# Patient Record
Sex: Female | Born: 1950 | Race: White | Hispanic: No | Marital: Married | Smoking: Never smoker
Health system: Southern US, Community
[De-identification: ages and names within clinical notes are randomized; demographics above are authoritative.]

## PROBLEM LIST (undated history)

## (undated) DIAGNOSIS — I10 Essential (primary) hypertension: Secondary | ICD-10-CM

## (undated) DIAGNOSIS — M1990 Unspecified osteoarthritis, unspecified site: Secondary | ICD-10-CM

## (undated) DIAGNOSIS — D649 Anemia, unspecified: Secondary | ICD-10-CM

## (undated) DIAGNOSIS — Z8719 Personal history of other diseases of the digestive system: Secondary | ICD-10-CM

## (undated) DIAGNOSIS — M4800 Spinal stenosis, site unspecified: Secondary | ICD-10-CM

## (undated) DIAGNOSIS — F419 Anxiety disorder, unspecified: Secondary | ICD-10-CM

## (undated) DIAGNOSIS — D72819 Decreased white blood cell count, unspecified: Secondary | ICD-10-CM

## (undated) DIAGNOSIS — K5790 Diverticulosis of intestine, part unspecified, without perforation or abscess without bleeding: Secondary | ICD-10-CM

## (undated) DIAGNOSIS — K449 Diaphragmatic hernia without obstruction or gangrene: Secondary | ICD-10-CM

## (undated) DIAGNOSIS — D509 Iron deficiency anemia, unspecified: Secondary | ICD-10-CM

## (undated) DIAGNOSIS — K219 Gastro-esophageal reflux disease without esophagitis: Secondary | ICD-10-CM

## (undated) DIAGNOSIS — C439 Malignant melanoma of skin, unspecified: Secondary | ICD-10-CM

## (undated) DIAGNOSIS — R413 Other amnesia: Secondary | ICD-10-CM

## (undated) HISTORY — DX: Diverticulosis of intestine, part unspecified, without perforation or abscess without bleeding: K57.90

## (undated) HISTORY — DX: Essential (primary) hypertension: I10

## (undated) HISTORY — DX: Diaphragmatic hernia without obstruction or gangrene: K44.9

## (undated) HISTORY — DX: Gastro-esophageal reflux disease without esophagitis: K21.9

## (undated) HISTORY — DX: Other amnesia: R41.3

## (undated) HISTORY — PX: COLONOSCOPY WITH ESOPHAGOGASTRODUODENOSCOPY (EGD): SHX5779

## (undated) HISTORY — PX: APPENDECTOMY: SHX54

## (undated) HISTORY — PX: COLOSTOMY: SHX63

## (undated) HISTORY — DX: Anemia, unspecified: D64.9

## (undated) HISTORY — DX: Spinal stenosis, site unspecified: M48.00

## (undated) HISTORY — DX: Anxiety disorder, unspecified: F41.9

## (undated) HISTORY — DX: Decreased white blood cell count, unspecified: D72.819

## (undated) HISTORY — DX: Iron deficiency anemia, unspecified: D50.9

## (undated) HISTORY — DX: Malignant melanoma of skin, unspecified: C43.9

---

## 1999-04-18 ENCOUNTER — Other Ambulatory Visit: Admission: RE | Admit: 1999-04-18 | Discharge: 1999-04-18 | Payer: Self-pay | Admitting: Obstetrics and Gynecology

## 2000-06-22 ENCOUNTER — Other Ambulatory Visit: Admission: RE | Admit: 2000-06-22 | Discharge: 2000-06-22 | Payer: Self-pay | Admitting: Obstetrics and Gynecology

## 2001-06-27 ENCOUNTER — Other Ambulatory Visit: Admission: RE | Admit: 2001-06-27 | Discharge: 2001-06-27 | Payer: Self-pay | Admitting: Obstetrics and Gynecology

## 2002-07-04 ENCOUNTER — Other Ambulatory Visit: Admission: RE | Admit: 2002-07-04 | Discharge: 2002-07-04 | Payer: Self-pay | Admitting: Obstetrics and Gynecology

## 2003-07-16 ENCOUNTER — Other Ambulatory Visit: Admission: RE | Admit: 2003-07-16 | Discharge: 2003-07-16 | Payer: Self-pay | Admitting: Obstetrics and Gynecology

## 2003-07-28 LAB — HM COLONOSCOPY: HM Colonoscopy: NORMAL

## 2004-03-21 ENCOUNTER — Encounter: Admission: RE | Admit: 2004-03-21 | Discharge: 2004-03-21 | Payer: Self-pay | Admitting: Otolaryngology

## 2004-07-19 ENCOUNTER — Other Ambulatory Visit: Admission: RE | Admit: 2004-07-19 | Discharge: 2004-07-19 | Payer: Self-pay | Admitting: *Deleted

## 2005-07-21 ENCOUNTER — Other Ambulatory Visit: Admission: RE | Admit: 2005-07-21 | Discharge: 2005-07-21 | Payer: Self-pay | Admitting: Obstetrics & Gynecology

## 2006-08-02 ENCOUNTER — Other Ambulatory Visit: Admission: RE | Admit: 2006-08-02 | Discharge: 2006-08-02 | Payer: Self-pay | Admitting: Obstetrics & Gynecology

## 2007-08-12 ENCOUNTER — Other Ambulatory Visit: Admission: RE | Admit: 2007-08-12 | Discharge: 2007-08-12 | Payer: Self-pay | Admitting: Obstetrics & Gynecology

## 2008-08-20 ENCOUNTER — Other Ambulatory Visit: Admission: RE | Admit: 2008-08-20 | Discharge: 2008-08-20 | Payer: Self-pay | Admitting: Obstetrics & Gynecology

## 2010-05-05 LAB — HM DEXA SCAN: HM Dexa Scan: NORMAL

## 2011-10-25 LAB — HM PAP SMEAR: HM Pap smear: NEGATIVE

## 2012-05-10 LAB — HM MAMMOGRAPHY: HM Mammogram: NEGATIVE

## 2012-11-20 ENCOUNTER — Encounter: Payer: Self-pay | Admitting: Obstetrics & Gynecology

## 2012-11-21 ENCOUNTER — Ambulatory Visit: Payer: Self-pay | Admitting: Obstetrics & Gynecology

## 2012-11-26 ENCOUNTER — Ambulatory Visit: Payer: Self-pay | Admitting: Obstetrics & Gynecology

## 2012-11-28 ENCOUNTER — Ambulatory Visit (INDEPENDENT_AMBULATORY_CARE_PROVIDER_SITE_OTHER): Payer: BC Managed Care – PPO | Admitting: Obstetrics & Gynecology

## 2012-11-28 ENCOUNTER — Encounter: Payer: Self-pay | Admitting: Obstetrics & Gynecology

## 2012-11-28 VITALS — BP 170/80 | HR 64 | Resp 16 | Ht 62.75 in | Wt 126.8 lb

## 2012-11-28 DIAGNOSIS — Z01419 Encounter for gynecological examination (general) (routine) without abnormal findings: Secondary | ICD-10-CM

## 2012-11-28 MED ORDER — ATENOLOL 25 MG PO TABS: 25.0000 mg | ORAL_TABLET | Freq: Every day | ORAL | Status: DC

## 2012-11-28 NOTE — Progress Notes (Signed)
62 y.o. [id number] MarriedCaucasianF here for annual exam.   Off her BP medication.  She has lost weight and is exercising regularly.  She is having some stress--66 year old son is living at home.  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  Worried about her memory.  She feels like she is being more forgetful about names or events in the past.  She is worried this could be something serious.  She brought blood pressure readings with her:  132/78, 152/82, 117/75, 140/85, 128/80, 132/75.  [facility] Lower readings are all after exercise.    Going to [place] in two weeks.  So excited.  Patient's last menstrual period was 05/30/2003.          Sexually active: yes  The current method of family planning is post menopausal status.    Exercising: yes  walking and cardio Smoker:  no  Health Maintenance:  Pap:  10/25/11 WNL/negative HR HPV History of abnormal Pap:  yes MMG:  05/10/12 3D normal Colonoscopy:  3/05 repeat in 10 years BMD:   12/11 normal TDaP:  2/11 Screening Labs: PCP, Hb today: PCP, Urine today: PCP   reports that she has never smoked. She has never used smokeless tobacco. She reports that she drinks about 0.5 ounces of alcohol per week. She reports that she does not use illicit drugs.  Past Medical History  Diagnosis Date  . Hypertension     off medication since 9/13    History reviewed. No pertinent past surgical history.  Current Outpatient Prescriptions  Medication Sig Dispense Refill  . ALPRAZolam (XANAX) 0.25 MG tablet 0.25 mg as needed.      Marland Kitchen aspirin EC 81 MG tablet Take 81 mg by mouth daily.      . Multiple Vitamin (MULTIVITAMIN) tablet Take 1 tablet by mouth daily.       No current facility-administered medications for this visit.    No family history on file.  ROS:  Pertinent items are noted in HPI.  Otherwise, a comprehensive ROS was negative.  Exam:   BP 170/80  Pulse 64  Resp 16  Ht 5' 2.75" (1.594 m)  Wt  126 lb 12.8 oz (57.516 kg)  BMI 22.64 kg/m2  LMP 05/30/2003  Weight change: 135  Height: 5' 2.75" (159.4 cm)  Ht Readings from Last 3 Encounters:  11/28/12 5' 2.75" (1.594 m)    General appearance: alert, cooperative and appears stated age Head: Normocephalic, without obvious abnormality, atraumatic Neck: no adenopathy, supple, symmetrical, trachea midline and thyroid normal to inspection and palpation Lungs: clear to auscultation bilaterally Breasts: normal appearance, no masses or tenderness Heart: regular rate and rhythm Abdomen: soft, non-tender; bowel sounds normal; no masses,  no organomegaly Extremities: extremities normal, atraumatic, no cyanosis or edema Skin: Skin color, texture, turgor normal. No rashes or lesions Lymph nodes: Cervical, supraclavicular, and axillary nodes normal. No abnormal inguinal nodes palpated Neurologic: Grossly normal   Pelvic: External genitalia:  no lesions              Urethra:  normal appearing urethra with no masses, tenderness or lesions              Bartholins and Skenes: normal                 Vagina: normal appearing vagina with normal color and discharge, no lesions  Cervix: no lesions              Pap taken: no Bimanual Exam:  Uterus:  normal size, contour, position, consistency, mobility, non-tender              Adnexa: normal adnexa and no mass, fullness, tenderness               Rectovaginal: Confirms               Anus:  normal sphincter tone, no lesions  A:  Well Woman with normal exam Hypertension PMP, no HRT Memory concerns  P:   Mammogram yearly pap smear with neg HR HPV last year.  NO pap today. Restarted Atenolol 25mg  daily.  Rx to pharmacy.  Pt will follow-up with Dr. Paulino Rily Patient will call back if she decides she would like referral for neuro evaluation return annually or prn  An After Visit Summary was printed and given to the patient.

## 2012-11-28 NOTE — Patient Instructions (Signed)

## 2013-02-21 DIAGNOSIS — K219 Gastro-esophageal reflux disease without esophagitis: Secondary | ICD-10-CM | POA: Insufficient documentation

## 2013-04-03 ENCOUNTER — Other Ambulatory Visit: Payer: Self-pay

## 2013-05-12 ENCOUNTER — Encounter: Payer: Self-pay | Admitting: Obstetrics & Gynecology

## 2013-05-12 ENCOUNTER — Ambulatory Visit (INDEPENDENT_AMBULATORY_CARE_PROVIDER_SITE_OTHER): Payer: BC Managed Care – PPO | Admitting: Obstetrics & Gynecology

## 2013-05-12 VITALS — BP 158/80 | HR 72 | Resp 16 | Wt 128.0 lb

## 2013-05-12 DIAGNOSIS — R1310 Dysphagia, unspecified: Secondary | ICD-10-CM

## 2013-05-12 DIAGNOSIS — D649 Anemia, unspecified: Secondary | ICD-10-CM

## 2013-05-12 LAB — CBC
HCT: 40 % (ref 36.0–46.0)
Hemoglobin: 13.7 g/dL (ref 12.0–15.0)
MCH: 30 pg (ref 26.0–34.0)
MCHC: 34 g/dL (ref 30.0–36.0)
MCV: 87.5 fL (ref 78.0–100.0)
Platelets: 245 10*3/uL (ref 150–400)
RBC: 4.57 MIL/uL (ref 3.87–5.11)
RDW: 15.9 % — ABNORMAL HIGH (ref 11.5–15.5)
WBC: 5.6 10*3/uL (ref 4.0–10.5)

## 2013-05-12 NOTE — Patient Instructions (Signed)
We will call with test results.

## 2013-05-12 NOTE — Progress Notes (Signed)
Subjective:     Patient ID: Carla Ramirez, female   DOB: 1950-12-25, 62 y.o.   MRN: [id number]  HPI 62 yo G2P3 MWF here for discussion of "breast finding".  Pt went to [facility] today for her screening MMG and reported a breast "lump" that she has noticed over last several months.  She reported that to [facility], who called to see if I wanted her to come here first or just proceed with imaging.  Pt reports that an ultrasound in addition to MMG was done and she was told that the finding is a rib.  She has lost about 40 pounds over last 2 years and she was told this could contribute.  While she is here, she wants to discuss a couple of other issues.  Reports in the late summer, ate some peanuts.  Had RLQ pain.  Also had some rectal bleeding.  Was seen by Carla Ramirez records in [facility].  Reports was anemic and he thought maybe she had a bleeding ulcer.  Underwent a colonoscopy and upper endoscopy in September.  Also reports having a feeling of something getting caught in her throat.  Was told the endoscopy was normal.  Started on prilosec which she thinks has done nothing.  Also reports that she has been on a baby ASA for family hx of stroke.  She wondered if this was part of the problem so stopped taking it.  Again, has not noticed any changes.    Wants to know what to do next.  Never had follow up with Carla Ramirez.  Was told everything was ok except two polyps removed.   Review of Systems  All other systems reviewed and are negative.       Objective:   Physical Exam  Constitutional: She appears well-developed and well-nourished.  Neck: Normal range of motion. Neck supple. No tracheal deviation present. No thyromegaly present.  Pulmonary/Chest:    Lymphadenopathy:    She has no cervical adenopathy.       Assessment:     "Palpable breast finding"--rib, after weight loss Sensation of trouble swallowing or something getting caught in throat H/O anemia before colonoscopy in Sept with Carla Ramirez       Plan:     Release of records from Carla Ramirez CBC Thyroid u/s.

## 2013-05-14 ENCOUNTER — Telehealth: Payer: Self-pay

## 2013-05-14 NOTE — Telephone Encounter (Signed)
Spoke with patient-will mail release form for her to sign and mail back, so we can get information from Dr [employer] office. She states she has a bad cold and did reschedule her test at [facility] until 06/02/13.

## 2013-05-14 NOTE — Telephone Encounter (Signed)
Returning a call to Kelly °

## 2013-05-14 NOTE — Telephone Encounter (Signed)
Message copied by Elisha Headland on Wed May 14, 2013  9:18 AM ------      Message from: Jerene Bears      Created: Tue May 13, 2013  5:43 AM       Informed via MyChart ------

## 2013-05-14 NOTE — Telephone Encounter (Signed)
lmtcb

## 2013-05-15 ENCOUNTER — Other Ambulatory Visit: Payer: Self-pay

## 2013-06-02 ENCOUNTER — Other Ambulatory Visit: Payer: Self-pay

## 2013-06-23 ENCOUNTER — Telehealth: Payer: Self-pay | Admitting: Emergency Medicine

## 2013-06-23 NOTE — Telephone Encounter (Signed)
Message copied by Michele Mcalpine on Mon Jun 23, 2013  9:37 AM ------      Message from: Megan Salon      Created: Fri Jun 20, 2013 11:59 AM      Regarding: RE: Thyroid U/S       Please call her and see what is going on?            MSM      ----- Message -----         From: Karen Chafe, RN         Sent: 06/20/2013  10:22 AM           To: Lyman Speller, MD      Subject: Thyroid U/S                                              Dr. Sabra Heck, this patient was on the list of the u/s that you ordered. It appears she did not schedule u/s. Do you want me to remind patient and schedule?       ------

## 2013-06-23 NOTE — Telephone Encounter (Signed)
Spoke with patient. She states she has decided to hold off on getting Thyroid u/s for now as she states "I really don't feel like this is my thyroid". She got some stronger medications from her primary doctor for acid reflux and will try those for while and then call back if she changes her mind about u/s.   Routing to provider for final review. Patient agreeable to disposition. Will close encounter

## 2013-06-23 NOTE — Telephone Encounter (Signed)
Message left to return call to Coleen Cardiff at [phone].    

## 2014-01-30 ENCOUNTER — Telehealth: Payer: Self-pay | Admitting: Obstetrics & Gynecology

## 2014-01-30 ENCOUNTER — Ambulatory Visit (INDEPENDENT_AMBULATORY_CARE_PROVIDER_SITE_OTHER): Payer: BC Managed Care – PPO | Admitting: Certified Nurse Midwife

## 2014-01-30 ENCOUNTER — Encounter: Payer: Self-pay | Admitting: Certified Nurse Midwife

## 2014-01-30 VITALS — BP 110/70 | HR 68 | Temp 98.6°F | Resp 16 | Ht 62.75 in | Wt 133.0 lb

## 2014-01-30 DIAGNOSIS — B3731 Acute candidiasis of vulva and vagina: Secondary | ICD-10-CM

## 2014-01-30 DIAGNOSIS — N390 Urinary tract infection, site not specified: Secondary | ICD-10-CM

## 2014-01-30 DIAGNOSIS — B373 Candidiasis of vulva and vagina: Secondary | ICD-10-CM

## 2014-01-30 LAB — POCT URINALYSIS DIPSTICK
Bilirubin, UA: NEGATIVE
Glucose, UA: NEGATIVE
Ketones, UA: NEGATIVE
Nitrite, UA: NEGATIVE
Protein, UA: NEGATIVE
Urobilinogen, UA: NEGATIVE
pH, UA: 5

## 2014-01-30 MED ORDER — CIPROFLOXACIN HCL 500 MG PO TABS: 500.0000 mg | ORAL_TABLET | Freq: Two times a day (BID) | ORAL | Status: DC

## 2014-01-30 MED ORDER — NYSTATIN 100000 UNIT/GM EX CREA: 1.0000 "application " | TOPICAL_CREAM | Freq: Two times a day (BID) | CUTANEOUS | Status: DC

## 2014-01-30 NOTE — Telephone Encounter (Signed)
Pt thinks she may have a uti and would like an appointment today if possible.

## 2014-01-30 NOTE — Patient Instructions (Addendum)
Urinary Tract Infection Urinary tract infections (UTIs) can develop anywhere along your urinary tract. Your urinary tract is your body's drainage system for removing wastes and extra water. Your urinary tract includes two kidneys, two ureters, a bladder, and a urethra. Your kidneys are a pair of bean-shaped organs. Each kidney is about the size of your fist. They are located below your ribs, one on each side of your spine. CAUSES Infections are caused by microbes, which are microscopic organisms, including fungi, viruses, and bacteria. These organisms are so small that they can only be seen through a microscope. Bacteria are the microbes that most commonly cause UTIs. SYMPTOMS  Symptoms of UTIs may vary by age and gender of the patient and by the location of the infection. Symptoms in young women typically include a frequent and intense urge to urinate and a painful, burning feeling in the bladder or urethra during urination. Older women and men are more likely to be tired, shaky, and weak and have muscle aches and abdominal pain. A fever may mean the infection is in your kidneys. Other symptoms of a kidney infection include pain in your back or sides below the ribs, nausea, and vomiting. DIAGNOSIS To diagnose a UTI, your caregiver will ask you about your symptoms. Your caregiver also will ask to provide a urine sample. The urine sample will be tested for bacteria and white blood cells. White blood cells are made by your body to help fight infection. TREATMENT  Typically, UTIs can be treated with medication. Because most UTIs are caused by a bacterial infection, they usually can be treated with the use of antibiotics. The choice of antibiotic and length of treatment depend on your symptoms and the type of bacteria causing your infection. HOME CARE INSTRUCTIONS  If you were prescribed antibiotics, take them exactly as your caregiver instructs you. Finish the medication even if you feel better after you  have only taken some of the medication.  Drink enough water and fluids to keep your urine clear or pale yellow.  Avoid caffeine, tea, and carbonated beverages. They tend to irritate your bladder.  Empty your bladder often. Avoid holding urine for long periods of time.  Empty your bladder before and after sexual intercourse.  After a bowel movement, women should cleanse from front to back. Use each tissue only once. SEEK MEDICAL CARE IF:   You have back pain.  You develop a fever.  Your symptoms do not begin to resolve within 3 days. SEEK IMMEDIATE MEDICAL CARE IF:   You have severe back pain or lower abdominal pain.  You develop chills.  You have nausea or vomiting.  You have continued burning or discomfort with urination. MAKE SURE YOU:   Understand these instructions.  Will watch your condition.  Will get help right away if you are not doing well or get worse. Document Released: 02/22/2005 Document Revised: 11/14/2011 Document Reviewed: 06/23/2011 [facility] Patient Information 2015 [place], [place]. This information is not intended to replace advice given to you by your health care provider. Make sure you discuss any questions you have with your health care provider. Candida Infection A Candida infection (also called yeast, fungus, and Monilia infection) is an overgrowth of yeast that can occur anywhere on the body. A yeast infection commonly occurs in warm, moist body areas. Usually, the infection remains localized but can spread to become a systemic infection. A yeast infection may be a sign of a more severe disease such as diabetes, leukemia, or AIDS. A yeast  infection can occur in both men and women. In women, Candida vaginitis is a vaginal infection. It is one of the most common causes of vaginitis. Men usually do not have symptoms or know they have an infection until other problems develop. Men may find out they have a yeast infection because their sex partner has a yeast  infection. Uncircumcised men are more likely to get a yeast infection than circumcised men. This is because the uncircumcised glans is not exposed to air and does not remain as dry as that of a circumcised glans. Older adults may develop yeast infections around dentures. CAUSES  Women  Antibiotics.  Steroid medication taken for a long time.  Being overweight (obese).  Diabetes.  Poor immune condition.  Certain serious medical conditions.  Immune suppressive medications for organ transplant patients.  Chemotherapy.  Pregnancy.  Menstruation.  Stress and fatigue.  Intravenous drug use.  Oral contraceptives.  Wearing tight-fitting clothes in the crotch area.  Catching it from a sex partner who has a yeast infection.  Spermicide.  Intravenous, urinary, or other catheters. Men  Catching it from a sex partner who has a yeast infection.  Having oral or anal sex with a person who has the infection.  Spermicide.  Diabetes.  Antibiotics.  Poor immune system.  Medications that suppress the immune system.  Intravenous drug use.  Intravenous, urinary, or other catheters. SYMPTOMS  Women  Thick, white vaginal discharge.  Vaginal itching.  Redness and swelling in and around the vagina.  Irritation of the lips of the vagina and perineum.  Blisters on the vaginal lips and perineum.  Painful sexual intercourse.  Low blood sugar (hypoglycemia).  Painful urination.  Bladder infections.  Intestinal problems such as constipation, indigestion, bad breath, bloating, increase in gas, diarrhea, or loose stools. Men  Men may develop intestinal problems such as constipation, indigestion, bad breath, bloating, increase in gas, diarrhea, or loose stools.  Dry, cracked skin on the penis with itching or discomfort.  Jock itch.  Dry, flaky skin.  Athlete's foot.  Hypoglycemia. DIAGNOSIS  Women  A history and an exam are performed.  The discharge may be  examined under a microscope.  A culture may be taken of the discharge. Men  A history and an exam are performed.  Any discharge from the penis or areas of cracked skin will be looked at under the microscope and cultured.  Stool samples may be cultured. TREATMENT  Women  Vaginal antifungal suppositories and creams.  Medicated creams to decrease irritation and itching on the outside of the vagina.  Warm compresses to the perineal area to decrease swelling and discomfort.  Oral antifungal medications.  Medicated vaginal suppositories or cream for repeated or recurrent infections.  Wash and dry the irritation areas before applying the cream.  Eating yogurt with Lactobacillus may help with prevention and treatment.  Sometimes painting the vagina with gentian violet solution may help if creams and suppositories do not work. Men  Antifungal creams and oral antifungal medications.  Sometimes treatment must continue for 30 days after the symptoms go away to prevent recurrence. HOME CARE INSTRUCTIONS  Women  Use cotton underwear and avoid tight-fitting clothing.  Avoid colored, scented toilet paper and deodorant tampons or pads.  Do not douche.  Keep your diabetes under control.  Finish all the prescribed medications.  Keep your skin clean and dry.  Consume milk or yogurt with Lactobacillus-active culture regularly. If you get frequent yeast infections and think that is what the infection  is, there are over-the-counter medications that you can get. If the infection does not show healing in 3 days, talk to your caregiver.  Tell your sex partner you have a yeast infection. Your partner may need treatment also, especially if your infection does not clear up or recurs. Men  Keep your skin clean and dry.  Keep your diabetes under control.  Finish all prescribed medications.  Tell your sex partner that you have a yeast infection so he or she can be treated if  necessary. SEEK MEDICAL CARE IF:   Your symptoms do not clear up or worsen in one week after treatment.  You have an oral temperature above 102 F (38.9 C).  You have trouble swallowing or eating for a prolonged time.  You develop blisters on and around your vagina.  You develop vaginal bleeding and it is not your menstrual period.  You develop abdominal pain.  You develop intestinal problems as mentioned above.  You get weak or light-headed.  You have painful or increased urination.  You have pain during sexual intercourse. MAKE SURE YOU:   Understand these instructions.  Will watch your condition.  Will get help right away if you are not doing well or get worse. Document Released: 06/22/2004 Document Revised: 09/29/2013 Document Reviewed: 10/04/2009 [facility] Patient Information 2015 [place], [place]. This information is not intended to replace advice given to you by your health care provider. Make sure you discuss any questions you have with your health care provider.

## 2014-01-30 NOTE — Telephone Encounter (Signed)
Spoke with patient. C/o dysuria. Would like appointment as soon as possible as she is getting ready to go out of town.  Offered 11:15 with Regina Eck CNM. She is agreeable.  Routing to provider for final review. Patient agreeable to disposition. Will close encounter

## 2014-01-30 NOTE — Progress Notes (Signed)
63 y.o. g2p2003 here with complaint of UTI, with onset  on 2-3 days ago. Patient complaining of urinary frequency/urgency/ and slight pain with urination. Patient denies fever, chills, nausea or back pain. No new personal products. Patient feels not related to sexual activity. Denies any vaginal symptoms or new personal products.  Menopausal no HRT, no vaginal dryness that she is aware, but not sexual active very frequently. No other health issues today.  O: Healthy female WDWN Affect: Normal, orientation x 3 Skin : warm and dry CVAT: negative bilateral Abdomen: positive for suprapubic tenderness, soft  Pelvic exam: External genital area: normal, no lesions Bladder,Urethra tender, Urethral meatus: tender and red at opening Vagina:  Scant vaginal discharge, normal appearance, at introitus increased redness with white thick exudate  Wet prep taken, ph 4.5.   Cervix: normal, non tender Uterus:normal,non tender Adnexa: normal non tender, no fullness or masses Wet prep positive for yeast Poct urine-rbc 2+, wbc-2+  A: UTI Yeast at introitus only Vaginal dryness?  P: Reviewed findings of UTI and need to increase water intake. Also change out of moist clothing or underwear after exercise, which may contribute to UTI an yeast occurrence AS:NKNLZ see order JQB:HALPF micro, culture Reviewed warning signs and symptoms of UTI Encouraged to limit soda, tea, and coffee RV 2 week if TOC needed for positive culture Discussed yeast findings and possible dryness in vaginal area. Can be checked again after treatment at upcoming aex. Rx Nystatin cream see order with instructions  RV prn/aex

## 2014-01-31 LAB — URINALYSIS, MICROSCOPIC ONLY
Bacteria, UA: NONE SEEN
Casts: NONE SEEN
Crystals: NONE SEEN
Squamous Epithelial / HPF: NONE SEEN

## 2014-01-31 NOTE — Progress Notes (Signed)
Reviewed personally.  M. Suzanne Ronit Marczak, MD.  

## 2014-02-01 LAB — URINE CULTURE: Colony Count: 25000

## 2014-02-05 ENCOUNTER — Encounter: Payer: Self-pay | Admitting: Obstetrics & Gynecology

## 2014-02-05 ENCOUNTER — Ambulatory Visit (INDEPENDENT_AMBULATORY_CARE_PROVIDER_SITE_OTHER): Payer: BC Managed Care – PPO | Admitting: Obstetrics & Gynecology

## 2014-02-05 VITALS — BP 138/82 | HR 60 | Resp 16 | Ht 62.75 in | Wt 133.6 lb

## 2014-02-05 DIAGNOSIS — K648 Other hemorrhoids: Secondary | ICD-10-CM

## 2014-02-05 DIAGNOSIS — N3000 Acute cystitis without hematuria: Secondary | ICD-10-CM

## 2014-02-05 DIAGNOSIS — Z124 Encounter for screening for malignant neoplasm of cervix: Secondary | ICD-10-CM

## 2014-02-05 DIAGNOSIS — Z01419 Encounter for gynecological examination (general) (routine) without abnormal findings: Secondary | ICD-10-CM

## 2014-02-05 DIAGNOSIS — K649 Unspecified hemorrhoids: Secondary | ICD-10-CM | POA: Insufficient documentation

## 2014-02-05 DIAGNOSIS — I10 Essential (primary) hypertension: Secondary | ICD-10-CM

## 2014-02-05 DIAGNOSIS — Z0000 Encounter for general adult medical examination without abnormal findings: Secondary | ICD-10-CM

## 2014-02-05 LAB — POCT URINALYSIS DIPSTICK
Bilirubin, UA: NEGATIVE
Glucose, UA: NEGATIVE
Ketones, UA: NEGATIVE
Nitrite, UA: NEGATIVE
Urobilinogen, UA: NEGATIVE
pH, UA: 5

## 2014-02-05 MED ORDER — HYDROCORTISONE 2.5 % RE CREA: 1.0000 "application " | TOPICAL_CREAM | Freq: Two times a day (BID) | RECTAL | Status: DC

## 2014-02-05 NOTE — Patient Instructions (Signed)

## 2014-02-05 NOTE — Progress Notes (Signed)
63 y.o. [id number] MarriedCaucasianF here for annual exam.  Had a UTI last week.  Symptoms are all gone now.  Does need TOC.  Took abx for 5 days.    PCP: Dr. Stephanie Acre.  Appt September  Patient's last menstrual period was 05/30/2003.          Sexually active: Yes.    The current method of family planning is post menopausal status.    Exercising: Yes.     Smoker:  no  Health Maintenance: Pap:  10/25/11 WNL/negative HR HPV History of abnormal Pap:  yes MMG:  05/12/14-recommend US-patient states did have this Colonoscopy:  9/14 and endoscopy-repeat in 5 years BMD:   2011 TDaP:  06/29/09 Screening Labs: next week, Hb today: donated blood, Urine today: WBC-trace, PROTEIN-trace, RBC-trace   reports that she has never smoked. She has never used smokeless tobacco. She reports that she drinks about .5 ounces of alcohol per week. She reports that she does not use illicit drugs.  Past Medical History  Diagnosis Date  . Hypertension     off medication since 9/13  . Anemia   . Diverticulosis   . Hypertension   . Hiatal hernia   . Acid reflux     History reviewed. No pertinent past surgical history.  Current Outpatient Prescriptions  Medication Sig Dispense Refill  . ALPRAZolam (XANAX) 0.25 MG tablet 0.25 mg as needed.      Marland Kitchen aspirin EC 81 MG tablet Take 81 mg by mouth daily.      Marland Kitchen atenolol (TENORMIN) 50 MG tablet Take 50 mg by mouth daily.      Marland Kitchen nystatin cream (MYCOSTATIN) Apply 1 application topically 2 (two) times daily. Apply to affected area BID for 5 days.  30 g  0  . pantoprazole (PROTONIX) 40 MG tablet Take 40 mg by mouth daily.       No current facility-administered medications for this visit.    Family History  Problem Relation Age of Onset  . Stroke Mother     ROS:  Pertinent items are noted in HPI.  Otherwise, a comprehensive ROS was negative.  Exam:   BP 138/82  Pulse 60  Resp 16  Ht 5' 2.75" (1.594 m)  Wt 133 lb 9.6 oz (60.601 kg)  BMI 23.85 kg/m2  LMP 05/30/2003    Height: 5' 2.75" (159.4 cm)  Ht Readings from Last 3 Encounters:  02/05/14 5' 2.75" (1.594 m)  01/30/14 5' 2.75" (1.594 m)  11/28/12 5' 2.75" (1.594 m)    General appearance: alert, cooperative and appears stated age Head: Normocephalic, without obvious abnormality, atraumatic Neck: no adenopathy, supple, symmetrical, trachea midline and thyroid normal to inspection and palpation Lungs: clear to auscultation bilaterally Breasts: normal appearance, no masses or tenderness Heart: regular rate and rhythm Abdomen: soft, non-tender; bowel sounds normal; no masses,  no organomegaly Extremities: extremities normal, atraumatic, no cyanosis or edema Skin: Skin color, texture, turgor normal. No rashes or lesions Lymph nodes: Cervical, supraclavicular, and axillary nodes normal. No abnormal inguinal nodes palpated Neurologic: Grossly normal   Pelvic: External genitalia:  no lesions              Urethra:  normal appearing urethra with no masses, tenderness or lesions              Bartholins and Skenes: normal                 Vagina: normal appearing vagina with normal color and discharge, no lesions  Cervix: no lesions              Pap taken: Yes.   Bimanual Exam:  Uterus:  normal size, contour, position, consistency, mobility, non-tender              Adnexa: normal adnexa and no mass, fullness, tenderness               Rectovaginal: Confirms               Anus:  normal sphincter tone, no lesions  A:  Well Woman with normal exam  Hypertension  PMP, no HRT  Hemorrhoids Concerns about hearing loss  P: Mammogram yearly  pap smear with neg HR HPV last 2013.  Pap only today.  Labs with Dr. Stephanie Acre.  A[ppt is in two weeks.   Patient will call back if she decides she would like referral for neuro evaluation.  Has had concerns about memory but declines this evaluation. Anusol HC 2.5% ointment rx to pharmacy Gave recommendation for a place to go and have hearing testing. return  annually or prn  An After Visit Summary was printed and given to the patient.

## 2014-02-06 LAB — IPS PAP TEST WITH REFLEX TO HPV

## 2014-02-06 LAB — URINE CULTURE: Colony Count: 15000

## 2014-02-23 ENCOUNTER — Telehealth: Payer: Self-pay

## 2014-02-23 NOTE — Telephone Encounter (Signed)
Pt return call during lunch

## 2014-02-23 NOTE — Telephone Encounter (Signed)
Lmtcb//kn 

## 2014-02-23 NOTE — Telephone Encounter (Signed)
Return call to Kelly. °

## 2014-02-23 NOTE — Telephone Encounter (Signed)
Message copied by Robley Fries on Mon Feb 23, 2014 11:15 AM ------      Message from: Megan Salon      Created: Fri Feb 20, 2014  4:39 AM       02 recall.  No treatment for Diptheroids on urine culture needed.  Spoke with Dr. Matilde Sprang. ------

## 2014-02-24 NOTE — Telephone Encounter (Signed)
Patient aware of results//kn 

## 2014-03-06 ENCOUNTER — Telehealth: Payer: Self-pay | Admitting: Hematology and Oncology

## 2014-03-06 NOTE — Telephone Encounter (Signed)
S/W PATIENT AND GAVE NP APPT FOR 10/22 @ 1:30 W/DR. [place]

## 2014-03-19 ENCOUNTER — Ambulatory Visit (HOSPITAL_BASED_OUTPATIENT_CLINIC_OR_DEPARTMENT_OTHER): Payer: BC Managed Care – PPO | Admitting: Hematology and Oncology

## 2014-03-19 ENCOUNTER — Encounter: Payer: Self-pay | Admitting: Hematology and Oncology

## 2014-03-19 ENCOUNTER — Ambulatory Visit: Payer: BC Managed Care – PPO

## 2014-03-19 ENCOUNTER — Telehealth: Payer: Self-pay | Admitting: Hematology and Oncology

## 2014-03-19 VITALS — BP 129/67 | HR 68 | Temp 98.2°F | Resp 20 | Ht 62.75 in | Wt 134.6 lb

## 2014-03-19 DIAGNOSIS — D509 Iron deficiency anemia, unspecified: Secondary | ICD-10-CM

## 2014-03-19 DIAGNOSIS — D72819 Decreased white blood cell count, unspecified: Secondary | ICD-10-CM

## 2014-03-19 HISTORY — DX: Iron deficiency anemia, unspecified: D50.9

## 2014-03-19 HISTORY — DX: Decreased white blood cell count, unspecified: D72.819

## 2014-03-19 NOTE — Assessment & Plan Note (Signed)
Likely transient from recent infection prior to blood draw. Recommend recheck next month.

## 2014-03-19 NOTE — Telephone Encounter (Signed)
Gave avs & appt d/t for Lab only

## 2014-03-19 NOTE — Assessment & Plan Note (Signed)
This is due to excessive blood donation in the past. Recommend she stop donating blood for a while and finish off the rest of her oral iron supplement. Recommend recheck blood work and iron studies next month and she agreed.

## 2014-03-19 NOTE — Progress Notes (Signed)
[facility] CONSULT NOTE  Patient Care Team: Lilian Coma, MD as PCP - General (Family Medicine)  CHIEF COMPLAINTS/PURPOSE OF CONSULTATION:  Anemia and leukopenia  HISTORY OF PRESENTING ILLNESS:  Carla Ramirez 63 y.o. female is here because of recent abnormal CBC.   She was found to have abnormal CBC from recent routine blood draw. Last year, she was alert to have mild anemia. On 05/12/2013, CBC was normal. On 02/24/2014, CBC showed WBC of 3.5 and hemoglobin of 11.7.  She denies recent chest pain on exertion, shortness of breath on minimal exertion, pre-syncopal episodes, or palpitations. She had not noticed any recent bleeding such as epistaxis, hematuria or hematochezia The patient denies over the counter NSAID ingestion. She is not on antiplatelets agents. She had EGD and colonoscopy that came back negative. She had no prior history or diagnosis of cancer. Her age appropriate screening programs are up-to-date. She denies any pica and eats a variety of diet. She never received blood transfusion. She is a regular blood donor for the last 8 years. The patient was prescribed oral iron supplements and she takes one daily. She was also found to have mild leukopenia recently. Prior to the blood draw, she had a urinary tract infection.  MEDICAL HISTORY:  Past Medical History  Diagnosis Date  . Hypertension     off medication since 9/13  . Anemia   . Diverticulosis   . Hypertension   . Hiatal hernia   . Acid reflux   . 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 Iron deficiency anemia 03/19/2014    SURGICAL HISTORY: Past Surgical History  Procedure Laterality Date  . Colonoscopy with esophagogastroduodenoscopy (egd)      SOCIAL HISTORY: History   Social History  . Marital Status: Married    Spouse Name: N/A    Number of Children: 3  . Years of Education: N/A   Occupational History  . Not on file.   Social History Main Topics  . Smoking status: Never Smoker   .  Smokeless tobacco: Never Used  . Alcohol Use: 0.5 oz/week    1 drink(s) per week  . Drug Use: No  . Sexual Activity: Yes    Partners: Male    Birth Control/ Protection: Post-menopausal   Other Topics Concern  . Not on file   Social History Narrative  . No narrative on file    FAMILY HISTORY: Family History  Problem Relation Age of Onset  . Stroke Mother   . Cancer Brother     lymphoma    ALLERGIES:  is allergic to sulfa antibiotics.  MEDICATIONS:  Current Outpatient Prescriptions  Medication Sig Dispense Refill  . ALPRAZolam (XANAX) 0.25 MG tablet 0.25 mg as needed.      Marland Kitchen aspirin EC 81 MG tablet Take 81 mg by mouth daily.      Marland Kitchen atenolol (TENORMIN) 50 MG tablet Take 50 mg by mouth daily.      . Cholecalciferol (VITAMIN D PO) Take 1 tablet by mouth daily.      . ferrous sulfate 325 (65 FE) MG tablet Take 325 mg by mouth daily.      . hydrocortisone (ANUSOL-HC) 2.5 % rectal cream Place 1 application rectally 2 (two) times daily.  30 g  1  . nystatin cream (MYCOSTATIN) Apply 1 application topically 2 (two) times daily. Apply to affected area BID for 5 days.  30 g  0  . pantoprazole (PROTONIX) 40 MG tablet Take 40 mg by mouth daily.  No current facility-administered medications for this visit.    REVIEW OF SYSTEMS:   Constitutional: Denies fevers, chills or abnormal night sweats Eyes: Denies blurriness of vision, double vision or watery eyes Ears, nose, mouth, throat, and face: Denies mucositis or sore throat Respiratory: Denies cough, dyspnea or wheezes Cardiovascular: Denies palpitation, chest discomfort or lower extremity swelling Gastrointestinal:  Denies nausea, heartburn or change in bowel habits Skin: Denies abnormal skin rashes Lymphatics: Denies new lymphadenopathy or easy bruising Neurological:Denies numbness, tingling or new weaknesses Behavioral/Psych: Mood is stable, no new changes  All other systems were reviewed with the patient and are  negative.  PHYSICAL EXAMINATION: ECOG PERFORMANCE STATUS: 0 - Asymptomatic  Filed Vitals:   03/19/14 1349  BP: 129/67  Pulse: 68  Temp: 98.2 F (36.8 C)  Resp: 20   Filed Weights   03/19/14 1349  Weight: 134 lb 9.6 oz (61.054 kg)    GENERAL:alert, no distress and comfortable SKIN: skin color, texture, turgor are normal, no rashes or significant lesions EYES: normal, conjunctiva are pink and non-injected, sclera clear OROPHARYNX:no exudate, no erythema and lips, buccal mucosa, and tongue normal  NECK: supple, thyroid normal size, non-tender, without nodularity LYMPH:  no palpable lymphadenopathy in the cervical, axillary or inguinal LUNGS: clear to auscultation and percussion with normal breathing effort HEART: regular rate & rhythm and no murmurs and no lower extremity edema ABDOMEN:abdomen soft, non-tender and normal bowel sounds Musculoskeletal:no cyanosis of digits and no clubbing  PSYCH: alert & oriented x 3 with fluent speech NEURO: no focal motor/sensory deficits  LABORATORY DATA:  I have reviewed the data as listed Lab Results  Component Value Date   WBC 5.6 05/12/2013   HGB 13.7 05/12/2013   HCT 40.0 05/12/2013   MCV 87.5 05/12/2013   PLT 245 05/12/2013    ASSESSMENT & PLAN:  Leukopenia Likely transient from recent infection prior to blood draw. Recommend recheck next month.  Iron deficiency anemia This is due to excessive blood donation in the past. Recommend she stop donating blood for a while and finish off the rest of her oral iron supplement. Recommend recheck blood work and iron studies next month and she agreed.    All questions were answered. The patient knows to call the clinic with any problems, questions or concerns. I spent 30 minutes counseling the patient face to face. The total time spent in the appointment was 40 minutes and more than 50% was on counseling.     [facility], [place], MD 03/19/2014 9:05 PM

## 2014-03-19 NOTE — Progress Notes (Signed)
Checked in new pt with no financial concerns at this time.  Pt is here for a hematology concern so financial assistance my not be needed.  Pt has Raquel's card for any questions or concerns.

## 2014-03-30 ENCOUNTER — Encounter: Payer: Self-pay | Admitting: Hematology and Oncology

## 2014-04-01 ENCOUNTER — Telehealth: Payer: Self-pay | Admitting: Obstetrics & Gynecology

## 2014-04-01 NOTE — Telephone Encounter (Signed)
Patient is in [place] and states she is returning a call to our office that just came to her. She dialed [place] back but there is no documentation as to who may have call her. Patient is concerned as she does not have an appointment until October 2016. Please advise?

## 2014-04-09 ENCOUNTER — Telehealth: Payer: Self-pay | Admitting: Obstetrics & Gynecology

## 2014-04-09 NOTE — Telephone Encounter (Signed)
Detailed message left okay per designated party release form. Advised have checked with the office and I am unsure who may have called her from our office. Apologized for inconvenience and advised to return call to our office with any questions.  Will close encounter.

## 2014-04-09 NOTE — Telephone Encounter (Signed)
Pt says someone called her from here about scheduling a mammogram and bone density appointment.

## 2014-04-10 NOTE — Telephone Encounter (Signed)
Advised patient that per message that I received from Dr Cherene Julian tests should be scheduled for January. Patient agreeable. Will call to schedule both.

## 2014-04-10 NOTE — Telephone Encounter (Signed)
Patient requesting to know when she is due for her next bone density at [facility]. She is getting ready to schedule her MMG for January 2016.

## 2014-04-15 ENCOUNTER — Telehealth: Payer: Self-pay | Admitting: *Deleted

## 2014-04-15 ENCOUNTER — Other Ambulatory Visit (HOSPITAL_BASED_OUTPATIENT_CLINIC_OR_DEPARTMENT_OTHER): Payer: BC Managed Care – PPO

## 2014-04-15 DIAGNOSIS — D509 Iron deficiency anemia, unspecified: Secondary | ICD-10-CM

## 2014-04-15 DIAGNOSIS — D72819 Decreased white blood cell count, unspecified: Secondary | ICD-10-CM

## 2014-04-15 LAB — CBC & DIFF AND RETIC
BASO%: 0.2 % (ref 0.0–2.0)
Basophils Absolute: 0 10*3/uL (ref 0.0–0.1)
EOS%: 1 % (ref 0.0–7.0)
Eosinophils Absolute: 0.1 10*3/uL (ref 0.0–0.5)
HCT: 38.8 % (ref 34.8–46.6)
HGB: 13.1 g/dL (ref 11.6–15.9)
Immature Retic Fract: 2.5 % (ref 1.60–10.00)
LYMPH%: 17.2 % (ref 14.0–49.7)
MCH: 30.2 pg (ref 25.1–34.0)
MCHC: 33.8 g/dL (ref 31.5–36.0)
MCV: 89.4 fL (ref 79.5–101.0)
MONO#: 0.2 10*3/uL (ref 0.1–0.9)
MONO%: 4.7 % (ref 0.0–14.0)
NEUT#: 4 10*3/uL (ref 1.5–6.5)
NEUT%: 76.9 % — ABNORMAL HIGH (ref 38.4–76.8)
Platelets: 218 10*3/uL (ref 145–400)
RBC: 4.34 10*6/uL (ref 3.70–5.45)
RDW: 14.5 % (ref 11.2–14.5)
Retic %: 1.05 % (ref 0.70–2.10)
Retic Ct Abs: 45.57 10*3/uL (ref 33.70–90.70)
WBC: 5.2 10*3/uL (ref 3.9–10.3)
lymph#: 0.9 10*3/uL (ref 0.9–3.3)

## 2014-04-15 LAB — CHCC SMEAR

## 2014-04-15 LAB — FERRITIN CHCC: Ferritin: 19 ng/ml (ref 9–269)

## 2014-04-15 LAB — IRON AND TIBC CHCC
%SAT: 39 % (ref 21–57)
Iron: 110 ug/dL (ref 41–142)
TIBC: 285 ug/dL (ref 236–444)
UIBC: 175 ug/dL (ref 120–384)

## 2014-04-15 LAB — SEDIMENTATION RATE: Sed Rate: 1 mm/hr (ref 0–22)

## 2014-04-15 NOTE — Telephone Encounter (Signed)
-----   Message from Heath Lark, MD sent at 04/15/2014  3:53 PM EST ----- Regarding: labs Labs are normal. Please let her know ----- Message -----    From: [facility] Interface    Sent: 04/15/2014   9:41 AM      To: Heath Lark, MD

## 2014-04-15 NOTE — Telephone Encounter (Signed)
Notified of results below 

## 2014-04-15 NOTE — Telephone Encounter (Signed)
PT. IS DIZZY THIS MORNING AND IS UNSURE ABOUT COMING FOR HER BLOOD DRAW TODAY. INSTRUCTED PT. TO OBTAIN A DRIVER AND COME FOR HER LAB APPOINTMENT. HER HUSBAND WILL BRING PT. AT 9:30AM. NOTIFIED THE LAB.

## 2014-06-01 ENCOUNTER — Telehealth: Payer: Self-pay | Admitting: Obstetrics & Gynecology

## 2014-06-01 NOTE — Telephone Encounter (Signed)
Order to Dr. Ammie Ferrier workstation for signature.

## 2014-06-01 NOTE — Telephone Encounter (Signed)
Order faxed to [facility] with fax confirmation received.   Detailed message left to advise patient should be ready for appointment tomorrow on mobile, okay per designated party release form.   Routing to provider for final review. Patient agreeable to disposition. Will close encounter

## 2014-06-01 NOTE — Telephone Encounter (Signed)
Patient has a BMD tomorrow at [facility]. Please send an order.

## 2014-06-18 ENCOUNTER — Telehealth: Payer: Self-pay

## 2014-06-18 NOTE — Telephone Encounter (Signed)
Lmtcb//kn 

## 2014-06-22 NOTE — Telephone Encounter (Signed)
Patient notified of [facility] BMD results. Copy will be scanned in epic.//kn 

## 2014-07-09 ENCOUNTER — Other Ambulatory Visit: Payer: Self-pay | Admitting: Gastroenterology

## 2014-07-09 DIAGNOSIS — R1084 Generalized abdominal pain: Secondary | ICD-10-CM

## 2014-07-20 ENCOUNTER — Ambulatory Visit
Admission: RE | Admit: 2014-07-20 | Discharge: 2014-07-20 | Disposition: A | Discharge status: Home / Self Care | Payer: BC Managed Care – PPO | Source: Ambulatory Visit | Attending: Gastroenterology | Admitting: Gastroenterology

## 2014-07-20 DIAGNOSIS — R1084 Generalized abdominal pain: Secondary | ICD-10-CM

## 2014-11-02 ENCOUNTER — Other Ambulatory Visit: Payer: Self-pay | Admitting: Gastroenterology

## 2014-11-02 DIAGNOSIS — R1310 Dysphagia, unspecified: Secondary | ICD-10-CM

## 2014-11-05 ENCOUNTER — Ambulatory Visit
Admission: RE | Admit: 2014-11-05 | Discharge: 2014-11-05 | Disposition: A | Discharge status: Home / Self Care | Payer: BC Managed Care – PPO | Source: Ambulatory Visit | Attending: Gastroenterology | Admitting: Gastroenterology

## 2014-11-05 DIAGNOSIS — R1310 Dysphagia, unspecified: Secondary | ICD-10-CM

## 2015-03-16 ENCOUNTER — Ambulatory Visit: Payer: BC Managed Care – PPO | Admitting: Obstetrics & Gynecology

## 2015-08-05 ENCOUNTER — Ambulatory Visit (INDEPENDENT_AMBULATORY_CARE_PROVIDER_SITE_OTHER): Payer: Medicare Other | Admitting: Obstetrics & Gynecology

## 2015-08-05 ENCOUNTER — Encounter: Payer: Self-pay | Admitting: Obstetrics & Gynecology

## 2015-08-05 VITALS — BP 148/76 | HR 60 | Resp 14 | Ht 62.25 in | Wt 135.0 lb

## 2015-08-05 DIAGNOSIS — R413 Other amnesia: Secondary | ICD-10-CM | POA: Diagnosis not present

## 2015-08-05 DIAGNOSIS — R319 Hematuria, unspecified: Secondary | ICD-10-CM

## 2015-08-05 DIAGNOSIS — Z01419 Encounter for gynecological examination (general) (routine) without abnormal findings: Secondary | ICD-10-CM

## 2015-08-05 DIAGNOSIS — Z124 Encounter for screening for malignant neoplasm of cervix: Secondary | ICD-10-CM

## 2015-08-05 DIAGNOSIS — Z0000 Encounter for general adult medical examination without abnormal findings: Secondary | ICD-10-CM | POA: Diagnosis not present

## 2015-08-05 LAB — POCT URINALYSIS DIPSTICK
BILIRUBIN UA: NEGATIVE
GLUCOSE UA: NEGATIVE
KETONES UA: NEGATIVE
Leukocytes, UA: NEGATIVE
Nitrite, UA: NEGATIVE
Protein, UA: NEGATIVE
UROBILINOGEN UA: NEGATIVE
pH, UA: 6

## 2015-08-05 MED ORDER — HYDROCORTISONE 2.5 % RE CREA: 1.0000 "application " | TOPICAL_CREAM | Freq: Two times a day (BID) | RECTAL | Status: DC

## 2015-08-05 NOTE — Addendum Note (Signed)
Addended by: Megan Salon on: 08/05/2015 09:36 AM   Modules accepted: Miquel Dunn

## 2015-08-05 NOTE — Addendum Note (Signed)
Addended by: Elroy Channel on: 08/05/2015 09:40 AM   Modules accepted: Orders, SmartSet

## 2015-08-05 NOTE — Progress Notes (Addendum)
65 y.o. [id number] MarriedCaucasianF here for annual exam.  Doing well.  No vaginal bleeding.    Pt and I discussed memory issues.  She and I discussed this last year.  She has thought about it and wants a referral this year.    [place]  PCP:  Dr. Stephanie Acre  Has given blood at the [facility] in the last two years.  Was a regular giver.    Patient's last menstrual period was 05/30/2003.          Sexually active: No.  The current method of family planning is post menopausal status.    Exercising: Yes.    Aerobics, strength training, walking Smoker:  no  Health Maintenance: Pap:  02/05/14 Neg. 10/25/11 Neg. HR HPV:neg History of abnormal Pap:  yes MMG:  07/12/15 BIRADS1:neg Colonoscopy:  03/03/13 Hemorrhoids  BMD:   06/02/14 Mild Osteopenia  TDaP:  06/29/2009 Shingles vaccine:  3/12 Screening Labs: PCP, Urine today: pending   reports that she has never smoked. She has never used smokeless tobacco. She reports that she drinks about 0.5 oz of alcohol per week. She reports that she does not use illicit drugs.  Past Medical History  Diagnosis Date  . Hypertension     off medication since 9/13  . Anemia   . Diverticulosis   . Hypertension   . Hiatal hernia   . Acid reflux   . 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 Iron deficiency anemia 03/19/2014    Past Surgical History  Procedure Laterality Date  . Colonoscopy with esophagogastroduodenoscopy (egd)      Current Outpatient Prescriptions  Medication Sig Dispense Refill  . ALPRAZolam (XANAX) 0.25 MG tablet 0.25 mg as needed.    Marland Kitchen aspirin EC 81 MG tablet Take 81 mg by mouth daily.    Marland Kitchen atenolol (TENORMIN) 50 MG tablet Take 50 mg by mouth daily.    . calcium carbonate (TUMS - DOSED IN MG ELEMENTAL CALCIUM) 500 MG chewable tablet Chew 2 tablets by mouth daily.    . Cholecalciferol (VITAMIN D PO) Take 1 tablet by mouth daily.    . hydrocortisone (ANUSOL-HC) 2.5 % rectal cream Place 1 application rectally 2 (two)  times daily. 30 g 1  . pantoprazole (PROTONIX) 40 MG tablet Take 40 mg by mouth daily.     No current facility-administered medications for this visit.    Family History  Problem Relation Age of Onset  . Stroke Mother   . Cancer Brother     lymphoma    ROS:  Pertinent items are noted in HPI.  Otherwise, a comprehensive ROS was negative.  Exam:   BP 148/76 mmHg  Pulse 60  Resp 14  Ht 5' 2.25" (1.581 m)  Wt 135 lb (61.236 kg)  BMI 24.50 kg/m2  LMP 05/30/2003  Weight change: +2#   Height: 5' 2.25" (158.1 cm)  Ht Readings from Last 3 Encounters:  08/05/15 5' 2.25" (1.581 m)  03/19/14 5' 2.75" (1.594 m)  02/05/14 5' 2.75" (1.594 m)    General appearance: alert, cooperative and appears stated age Head: Normocephalic, without obvious abnormality, atraumatic Neck: no adenopathy, supple, symmetrical, trachea midline and thyroid normal to inspection and palpation Lungs: clear to auscultation bilaterally Breasts: normal appearance, no masses or tenderness Heart: regular rate and rhythm Abdomen: soft, non-tender; bowel sounds normal; no masses,  no organomegaly Extremities: extremities normal, atraumatic, no cyanosis or edema Skin: Skin color, texture, turgor normal. No rashes or lesions Lymph nodes: Cervical,  supraclavicular, and axillary nodes normal. No abnormal inguinal nodes palpated Neurologic: Grossly normal   Pelvic: External genitalia:  no lesions              Urethra:  normal appearing urethra with no masses, tenderness or lesions              Bartholins and Skenes: normal                 Vagina: normal appearing vagina with normal color and discharge, no lesions              Cervix: no lesions              Pap taken: Yes.   Bimanual Exam:  Uterus:  normal size, contour, position, consistency, mobility, non-tender              Adnexa: normal adnexa and no mass, fullness, tenderness               Rectovaginal: Confirms               Anus:  normal sphincter tone, no  lesions  Chaperone was present for exam.  A:  Well Woman with normal exam  Hypertension  PMP, no HRT  Hemorrhoids Memory changes Hearing loss.  Information given last year.  Pt never went. Microscopic hematuria  P: Mammogram yearly  pap smear with neg HR HPV last 2013. Pap with HR HPV.  Labs with Dr. Stephanie Acre. Does lab work with her.  Will start the pneumonia vaccine thi syear. Referral to Dr. Jannifer Franklin placed. Rx for Anusol to pharmacy for pt.  Urine micro pending. return annually or prn

## 2015-08-06 LAB — URINALYSIS, MICROSCOPIC ONLY
Bacteria, UA: NONE SEEN [HPF]
CASTS: NONE SEEN [LPF]
Crystals: NONE SEEN [HPF]
SQUAMOUS EPITHELIAL / LPF: NONE SEEN [HPF] (ref <=5)
WBC UA: NONE SEEN WBC/HPF (ref <=5)
Yeast: NONE SEEN [HPF]

## 2015-08-06 LAB — IPS PAP SMEAR ONLY

## 2015-08-09 ENCOUNTER — Telehealth: Payer: Self-pay | Admitting: Obstetrics & Gynecology

## 2015-08-09 NOTE — Telephone Encounter (Signed)
[facility], advised by [facility] referral has been received, I was transferred to [place] to proceed in scheduling. I received Diane's voicemail,  left a message requesting a call back, in order to schedule

## 2015-08-09 NOTE — Telephone Encounter (Signed)
Spoke with Carla Ramirez at [facility], and patient is scheduled to see Dr Jannifer Franklin at [facility] on 08/16/15 at 9:30. Patient will need to arrive at 9:00.  I have conveyed this information and patient is agreeable to appointment date and time. Also provided patient with [facility] phone number [phone] for directions

## 2015-08-09 NOTE — Telephone Encounter (Signed)
Spoke with patient to advised referral has been sent to [facility] / Dr Jannifer Franklin on 08/06/15. We should hear something from their office once appointment has been scheduled

## 2015-08-09 NOTE — Telephone Encounter (Signed)
Patient calling to check on referral to a neurologist.

## 2015-08-16 ENCOUNTER — Encounter: Payer: Self-pay | Admitting: Neurology

## 2015-08-16 ENCOUNTER — Ambulatory Visit (INDEPENDENT_AMBULATORY_CARE_PROVIDER_SITE_OTHER): Payer: Medicare Other | Admitting: Neurology

## 2015-08-16 VITALS — BP 171/77 | HR 59 | Ht 62.25 in | Wt 137.0 lb

## 2015-08-16 DIAGNOSIS — E538 Deficiency of other specified B group vitamins: Secondary | ICD-10-CM

## 2015-08-16 DIAGNOSIS — R413 Other amnesia: Secondary | ICD-10-CM

## 2015-08-16 HISTORY — DX: Other amnesia: R41.3

## 2015-08-16 NOTE — Progress Notes (Signed)
Reason for visit: Memory disturbance  Referring physician: Dr. [name] is a 65 y.o. female  History of present illness:  Carla Ramirez is a 65 year old right-handed white female with a history of a mild memory disturbance that she has noted over the last 2 years. The patient has mainly had difficulty with remembering names for people and had difficulty finding words while speaking. The patient indicates that she will occasionally misplace things about the house, but not frequently. She denies that she has given up any activities of daily living because of memory problems. She denies any issues keeping up with medications or appointments, she has not had any problems with driving. She does not do any of the financial activities, her husband does this. She reports that she has also noted a soreness sensation in the left and right occipital areas, sometimes on the top of the head. The sensation is not severe, unassociated with neck pain or neck stiffness, no pain down arms or legs. She reports no numbness or weakness of the face, arms, or legs. She denies any speech or swallowing problems. She has not had any excessive daytime drowsiness or fatigue. She denies any balance issues or difficulty controlling the bowels or the bladder. She comes to this office for an evaluation. She reports that her mother also had a memory issue.  Past Medical History  Diagnosis Date  . Hypertension     off medication since 9/13  . Anemia   . Diverticulosis   . Hypertension   . Hiatal hernia   . Acid reflux   . 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 Iron deficiency anemia 03/19/2014  . Anxiety   . Memory change 08/16/2015    Past Surgical History  Procedure Laterality Date  . Colonoscopy with esophagogastroduodenoscopy (egd)      Family History  Problem Relation Age of Onset  . Stroke Mother   . Dementia Mother   . Cancer Brother     lymphoma    Social history:  reports that she has never smoked. She  has never used smokeless tobacco. She reports that she drinks about 0.5 oz of alcohol per week. She reports that she does not use illicit drugs.  Medications:  Prior to Admission medications   Medication Sig Start Date End Date Taking? Authorizing Provider  ALPRAZolam (XANAX) 0.25 MG tablet 0.25 mg as needed. 11/04/12  Yes Historical Provider, MD  aspirin EC 81 MG tablet Take 81 mg by mouth daily.   Yes Historical Provider, MD  atenolol (TENORMIN) 50 MG tablet Take 50 mg by mouth daily.   Yes Historical Provider, MD  calcium carbonate (TUMS - DOSED IN MG ELEMENTAL CALCIUM) 500 MG chewable tablet Chew 2 tablets by mouth daily.   Yes Historical Provider, MD  Cholecalciferol (VITAMIN D PO) Take 1 tablet by mouth daily. 1000units   Yes Historical Provider, MD  hydrocortisone (ANUSOL-HC) 2.5 % rectal cream Place 1 application rectally 2 (two) times daily. Patient taking differently: Place 1 application rectally 2 (two) times daily as needed.  08/05/15  Yes Megan Salon, MD  pantoprazole (PROTONIX) 40 MG tablet Take 40 mg by mouth 2 (two) times daily.    Yes Historical Provider, MD      Allergies  Allergen Reactions  . Sulfa Antibiotics Swelling    Facial swelling    ROS:  Out of a complete 14 system review of symptoms, the patient complains only of the following symptoms, and all other reviewed systems  are negative.  Headache Memory disturbance  Blood pressure 171/77, pulse 59, height 5' 2.25" (1.581 m), weight 137 lb (62.143 kg), last menstrual period 05/30/2003.  Physical Exam  General: The patient is alert and cooperative at the time of the examination.  Eyes: Pupils are equal, round, and reactive to light. Discs are flat bilaterally.  Neck: The neck is supple, no carotid bruits are noted.  Respiratory: The respiratory examination is clear.  Cardiovascular: The cardiovascular examination reveals a regular rate and rhythm, no obvious murmurs or rubs are noted.  Skin: Extremities  are without significant edema.  Neurologic Exam  Mental status: The patient is alert and oriented x 3 at the time of the examination. The patient has apparent normal recent and remote memory, with an apparently normal attention span and concentration ability. Mini-Mental Status Examination done today shows a total score of 30/30. The patient is able to name 7 animals in 30 seconds.  Cranial nerves: Facial symmetry is present. There is good sensation of the face to pinprick and soft touch bilaterally. The strength of the facial muscles and the muscles to head turning and shoulder shrug are normal bilaterally. Speech is well enunciated, no aphasia or dysarthria is noted. Extraocular movements are full. Visual fields are full. The tongue is midline, and the patient has symmetric elevation of the soft palate. No obvious hearing deficits are noted.  Motor: The motor testing reveals 5 over 5 strength of all 4 extremities. Good symmetric motor tone is noted throughout.  Sensory: Sensory testing is intact to pinprick, soft touch, vibration sensation, and position sense on all 4 extremities. No evidence of extinction is noted.  Coordination: Cerebellar testing reveals good finger-nose-finger and heel-to-shin bilaterally.  Gait and station: Gait is normal. Tandem gait is normal. Romberg is negative. No drift is seen.  Reflexes: Deep tendon reflexes are symmetric and normal bilaterally. Toes are downgoing bilaterally.   Assessment/Plan:  1. Mild memory disturbance  2. Mild headache, occipital  The patient has a relatively unremarkable clinical examination. The patient may have some minimum cognitive impairment, we will send her for blood work today, and MRI of the brain. The patient appears to have a primarily occipital, very mild headache syndrome that may be cervicogenic, but she does not have significant neck discomfort. The patient will follow-up in 6 months to reevaluate the memory. I will contact  her concerning the results of the scan and blood work.  Jill Alexanders MD 08/16/2015 9:04 PM  [facility] [address] [place], [place] [address]  Phone [phone] Fax [phone]

## 2015-08-18 LAB — RPR: RPR: NONREACTIVE

## 2015-08-18 LAB — METHYLMALONIC ACID, SERUM: METHYLMALONIC ACID: 191 nmol/L (ref 0–378)

## 2015-08-18 LAB — VITAMIN B12: Vitamin B-12: 533 pg/mL (ref 211–946)

## 2015-08-18 LAB — COPPER, SERUM: Copper: 109 ug/dL (ref 72–166)

## 2015-08-22 ENCOUNTER — Ambulatory Visit
Admission: RE | Admit: 2015-08-22 | Discharge: 2015-08-22 | Disposition: A | Discharge status: Home / Self Care | Payer: PRIVATE HEALTH INSURANCE | Source: Ambulatory Visit | Attending: Neurology | Admitting: Neurology

## 2015-08-22 DIAGNOSIS — R413 Other amnesia: Secondary | ICD-10-CM

## 2015-08-23 ENCOUNTER — Telehealth: Payer: Self-pay | Admitting: Neurology

## 2015-08-23 NOTE — Telephone Encounter (Signed)
  I called the patient. The MRI of the brain looks OK. There are no issues on the MRI the brain that would be causing memory problems.  MRI brain 08/23/2015:  IMPRESSION: This MRI of the brain without contrast shows the following: 1. Mild number of T2/FLAIR hyperintense foci in the subcortical and deep white matter of the hemispheres. None of the foci appears to be acute. The extent is typical for age. 2. Brain volume is normal for age. 3. There are no acute findings.

## 2016-02-23 ENCOUNTER — Encounter: Payer: Self-pay | Admitting: Neurology

## 2016-02-23 ENCOUNTER — Ambulatory Visit (INDEPENDENT_AMBULATORY_CARE_PROVIDER_SITE_OTHER): Payer: Medicare Other | Admitting: Neurology

## 2016-02-23 VITALS — BP 161/89 | HR 61 | Ht 62.25 in | Wt 131.5 lb

## 2016-02-23 DIAGNOSIS — R413 Other amnesia: Secondary | ICD-10-CM | POA: Diagnosis not present

## 2016-02-23 NOTE — Progress Notes (Signed)
Reason for visit: Memory disturbance  Carla Ramirez is an 65 y.o. female  History of present illness:  Carla Ramirez is a 65 year old right-handed white female with a history of a mild memory disturbance. The patient mainly has word finding and naming problems. The patient was seen about 6 months ago, she reports good stability in her cognitive functioning since that time. The patient is not on any medications for memory. The patient does have some problems with reflux at times, she has had bilateral occipital headaches, this went away, but more recently in the last 3 weeks she has had some left occipital discomfort. The patient denies any significant neck stiffness, she does not take any medications for the discomfort.  Past Medical History:  Diagnosis Date  . Acid reflux   . Anemia   . Anxiety   . Diverticulosis   . Hiatal hernia   . Hypertension    off medication since 9/13  . Hypertension   . Iron deficiency anemia 03/19/2014  . 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 Memory change 08/16/2015    Past Surgical History:  Procedure Laterality Date  . COLONOSCOPY WITH ESOPHAGOGASTRODUODENOSCOPY (EGD)      Family History  Problem Relation Age of Onset  . Stroke Mother   . Dementia Mother   . Cancer Brother     lymphoma    Social history:  reports that she has never smoked. She has never used smokeless tobacco. She reports that she drinks about 0.5 oz of alcohol per week . She reports that she does not use drugs.    Allergies  Allergen Reactions  . Sulfa Antibiotics Swelling    Facial swelling    Medications:  Prior to Admission medications   Medication Sig Start Date End Date Taking? Authorizing Provider  ALPRAZolam (XANAX) 0.25 MG tablet 0.25 mg as needed. 11/04/12  Yes Historical Provider, MD  aspirin EC 81 MG tablet Take 81 mg by mouth daily.   Yes Historical Provider, MD  atenolol (TENORMIN) 50 MG tablet Take 50 mg by mouth daily.   Yes Historical Provider, MD  calcium carbonate  (TUMS - DOSED IN MG ELEMENTAL CALCIUM) 500 MG chewable tablet Chew 2 tablets by mouth daily as needed.    Yes Historical Provider, MD  hydrocortisone (ANUSOL-HC) 2.5 % rectal cream Place 1 application rectally 2 (two) times daily. Patient taking differently: Place 1 application rectally 2 (two) times daily as needed.  08/05/15  Yes Megan Salon, MD  pantoprazole (PROTONIX) 40 MG tablet Take 40 mg by mouth 2 (two) times daily as needed.    Yes Historical Provider, MD    ROS:  Out of a complete 14 system review of symptoms, the patient complains only of the following symptoms, and all other reviewed systems are negative.  Memory disturbance  Blood pressure (!) 161/89, pulse 61, height 5' 2.25" (1.581 m), weight 131 lb 8 oz (59.6 kg), last menstrual period 05/30/2003.  Physical Exam  General: The patient is alert and cooperative at the time of the examination.  Skin: No significant peripheral edema is noted.   Neurologic Exam  Mental status: The patient is alert and oriented x 3 at the time of the examination. The patient has apparent normal recent and remote memory, with an apparently normal attention span and concentration ability. Mini-Mental Status Examination done today shows a total score of 30/30.   Cranial nerves: Facial symmetry is present. Speech is normal, no aphasia or dysarthria is noted. Extraocular movements are full.  Visual fields are full.  Motor: The patient has good strength in all 4 extremities.  Sensory examination: Soft touch sensation is symmetric on the face, arms, and legs.  Coordination: The patient has good finger-nose-finger and heel-to-shin bilaterally.  Gait and station: The patient has a normal gait. Tandem gait is normal. Romberg is negative. No drift is seen.  Reflexes: Deep tendon reflexes are symmetric.   MRI brain 08/23/2015:  IMPRESSION: This MRI of the brain without contrast shows the following: 1. Mild number of T2/FLAIR hyperintense  foci in the subcortical and deep white matter of the hemispheres. None of the foci appears to be acute. The extent is typical for age. 2. Brain volume is normal for age. 3. There are no acute findings.   Assessment/Plan:  1. Memory disturbance  The patient is doing well at this time. The occipital headaches are minor, she is to contact our office if they become more significant. The patient will follow-up through this office in about 9 months, if she has any concerns prior to this, she is to contact our office.  Jill Alexanders MD 02/23/2016 9:05 AM  [facility] [address] [place], [place] [address]  Phone [phone] Fax [phone]

## 2016-05-29 DIAGNOSIS — C439 Malignant melanoma of skin, unspecified: Secondary | ICD-10-CM

## 2016-05-29 HISTORY — DX: Malignant melanoma of skin, unspecified: C43.9

## 2016-07-24 ENCOUNTER — Telehealth: Payer: Self-pay | Admitting: *Deleted

## 2016-07-24 NOTE — Telephone Encounter (Signed)
Returned call to patient. BMD results reviewed with patient and she verbalized understanding.   Routing to provider for final review. Patient agreeable to disposition. Will close encounter.

## 2016-07-24 NOTE — Telephone Encounter (Signed)
Message left to return call to Jola Critzer at [phone].    

## 2016-07-24 NOTE — Telephone Encounter (Signed)
Patient left a message on our answering machine at lunch returning Emily's call.

## 2016-07-27 ENCOUNTER — Encounter: Payer: Self-pay | Admitting: Obstetrics & Gynecology

## 2016-10-26 ENCOUNTER — Other Ambulatory Visit: Payer: Self-pay | Admitting: Obstetrics & Gynecology

## 2016-10-26 NOTE — Telephone Encounter (Signed)
Medication refill request: Anusol 2.5 rectal cream Last AEX:  08-05-15 Next AEX: 11-23-16 Last MMG (if hormonal medication request): 07-12-16 category b density birads 1:neg Refill authorized: rx last given at [facility]. Please approve if appropriate

## 2016-11-16 ENCOUNTER — Ambulatory Visit (INDEPENDENT_AMBULATORY_CARE_PROVIDER_SITE_OTHER): Payer: Medicare Other | Admitting: Obstetrics & Gynecology

## 2016-11-16 ENCOUNTER — Encounter: Payer: Self-pay | Admitting: Obstetrics & Gynecology

## 2016-11-16 VITALS — BP 120/72 | HR 64 | Resp 14 | Ht 62.5 in | Wt 129.0 lb

## 2016-11-16 DIAGNOSIS — Z01419 Encounter for gynecological examination (general) (routine) without abnormal findings: Secondary | ICD-10-CM | POA: Diagnosis not present

## 2016-11-16 NOTE — Progress Notes (Signed)
66 y.o. [id number] MarriedCaucasianF here for annual exam.  Having a lot of stressors.  Just did a kitchen remodel.  Son getting married next week.  Feels memory has been affected by this.  Seeing Dr. Jannifer Franklin.    Denies vaginal bleeding.    Patient's last menstrual period was 05/30/2003.          Sexually active: Yes.    The current method of family planning is post menopausal status.    Exercising: Yes.    cardio, strength training, [employer] Smoker:  no  Health Maintenance: Pap:  08/05/15 Neg  02/05/14 Neg  History of abnormal Pap:  Yes, long time ago MMG:  07/12/16 BIRADS2:benign  Colonoscopy:  03/03/13 f/u 5 years   BMD:   07/12/16 Mild Osteopenia  TDaP:  06/2009 Pneumonia vaccine(s):  Unsure Zostavax:  Shingrix 10/15/16 Hep C testing: had donated blood regularly Screening Labs: PCP   reports that she has never smoked. She has never used smokeless tobacco. She reports that she drinks about 0.5 oz of alcohol per week . She reports that she does not use drugs.  Past Medical History:  Diagnosis Date  . Acid reflux   . Anemia   . Anxiety   . Diverticulosis   . Hiatal hernia   . Hypertension    off medication since 9/13  . Hypertension   . Iron deficiency anemia 03/19/2014  . 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 Memory change 08/16/2015    Past Surgical History:  Procedure Laterality Date  . COLONOSCOPY WITH ESOPHAGOGASTRODUODENOSCOPY (EGD)      Current Outpatient Prescriptions  Medication Sig Dispense Refill  . ALPRAZolam (XANAX) 0.25 MG tablet 0.25 mg as needed.    Marland Kitchen aspirin EC 81 MG tablet Take 81 mg by mouth daily.    Marland Kitchen atenolol (TENORMIN) 50 MG tablet Take 50 mg by mouth daily.    . calcium carbonate (TUMS - DOSED IN MG ELEMENTAL CALCIUM) 500 MG chewable tablet Chew 2 tablets by mouth daily as needed.     . cholecalciferol (VITAMIN D) 1000 units tablet Take 1,000 Units by mouth daily.    . hydrocortisone (ANUSOL-HC) 2.5 % rectal cream apply rectally twice a day (Patient taking differently:  apply rectally prn) 30 g 1   No current facility-administered medications for this visit.     Family History  Problem Relation Age of Onset  . Stroke Mother   . Dementia Mother   . Cancer Brother        lymphoma    ROS:  Pertinent items are noted in HPI.  Otherwise, a comprehensive ROS was negative.  Exam:   BP (!) 150/80 (BP Location: Right Arm, Patient Position: Sitting, Cuff Size: Normal)   Pulse 64   Resp 14   Ht 5' 2.5" (1.588 m)   Wt 129 lb (58.5 kg)   LMP 05/30/2003   BMI 23.22 kg/m   Recheck BP:  120/72 Height: 5' 2.5" (158.8 cm)  Ht Readings from Last 3 Encounters:  11/16/16 5' 2.5" (1.588 m)  02/23/16 5' 2.25" (1.581 m)  08/16/15 5' 2.25" (1.581 m)    General appearance: alert, cooperative and appears stated age Head: Normocephalic, without obvious abnormality, atraumatic Neck: no adenopathy, supple, symmetrical, trachea midline and thyroid normal to inspection and palpation Lungs: clear to auscultation bilaterally Breasts: normal appearance, no masses or tenderness Heart: regular rate and rhythm Abdomen: soft, non-tender; bowel sounds normal; no masses,  no organomegaly Extremities: extremities normal, atraumatic, no cyanosis or edema Skin: Skin color, texture,  turgor normal. No rashes or lesions Lymph nodes: Cervical, supraclavicular, and axillary nodes normal. No abnormal inguinal nodes palpated Neurologic: Grossly normal   Pelvic: External genitalia:  no lesions              Urethra:  normal appearing urethra with no masses, tenderness or lesions              Bartholins and Skenes: normal                 Vagina: normal appearing vagina with normal color and discharge, no lesions              Cervix: no lesions              Pap taken: No. Bimanual Exam:  Uterus:  normal size, contour, position, consistency, mobility, non-tender              Adnexa: normal adnexa and no mass, fullness, tenderness               Rectovaginal: Confirms               Anus:   normal sphincter tone, no lesions  Chaperone was present for exam.  A:  Well Woman with normal exam PMP, no HRT Memory changes, seeing Dr. Jannifer Franklin H/o microscopic hematuria  P:   Mammogram guidelines reviewed pap smear 2017 negative.  No pap smear today Labs with Dr. Stephanie Acre in October. Release of vaccine records. return annually or prn

## 2016-11-16 NOTE — Patient Instructions (Signed)
Remember to get your second shingles vaccine.  2-6 months after the first one which was on 10/15/16

## 2016-12-12 ENCOUNTER — Encounter: Payer: Self-pay | Admitting: Neurology

## 2016-12-12 ENCOUNTER — Ambulatory Visit (INDEPENDENT_AMBULATORY_CARE_PROVIDER_SITE_OTHER): Payer: Medicare Other | Admitting: Neurology

## 2016-12-12 VITALS — BP 198/83 | HR 53 | Ht 62.5 in | Wt 130.5 lb

## 2016-12-12 DIAGNOSIS — R413 Other amnesia: Secondary | ICD-10-CM | POA: Diagnosis not present

## 2016-12-12 NOTE — Progress Notes (Signed)
Reason for visit: Memory disturbance  Carla Ramirez is an 66 y.o. female  History of present illness:  Carla Ramirez is a 66 year old right-handed white female with a history of a mild memory disturbance. The patient has done well since last seen 9 months ago without any significant change in her cognitive functioning level. She was planning a wedding recently, and under stress she noted some problems with her cognitive functioning, this has improved once the stress level has reduced. The patient has had some issues with reflux with some discomfort in the mid chest area, some shortness of breath with walking. She has been noted to have an elevated blood pressure today in the office. The patient denies any issues with operating a motor vehicle, she denies problems keeping up with medications or appointments or difficulty with managing the finances. She is not on any medications for memory.  Past Medical History:  Diagnosis Date  . Acid reflux   . Anemia   . Anxiety   . Diverticulosis   . Hiatal hernia   . Hypertension    off medication since 9/13  . Hypertension   . Iron deficiency anemia 03/19/2014  . 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 Memory change 08/16/2015    Past Surgical History:  Procedure Laterality Date  . COLONOSCOPY WITH ESOPHAGOGASTRODUODENOSCOPY (EGD)      Family History  Problem Relation Age of Onset  . Stroke Mother   . Dementia Mother   . Cancer Brother        lymphoma    Social history:  reports that she has never smoked. She has never used smokeless tobacco. She reports that she drinks about 0.5 oz of alcohol per week . She reports that she does not use drugs.    Allergies  Allergen Reactions  . Sulfa Antibiotics Swelling    Facial swelling    Medications:  Prior to Admission medications   Medication Sig Start Date End Date Taking? Authorizing Provider  ALPRAZolam (XANAX) 0.25 MG tablet 0.25 mg as needed. 11/04/12  Yes [provider]  aspirin EC 81 MG  tablet Take 81 mg by mouth daily.   Yes [provider]  atenolol (TENORMIN) 50 MG tablet Take 50 mg by mouth daily.   Yes [provider]  calcium carbonate (TUMS - DOSED IN MG ELEMENTAL CALCIUM) 500 MG chewable tablet Chew 2 tablets by mouth daily as needed.    Yes [provider]  cholecalciferol (VITAMIN D) 1000 units tablet Take 2,000 Units by mouth daily.    Yes [provider]  hydrocortisone (ANUSOL-HC) 2.5 % rectal cream apply rectally twice a day Patient taking differently: apply rectally prn 10/27/16  Yes Megan Salon, MD    ROS:  Out of a complete 14 system review of symptoms, the patient complains only of the following symptoms, and all other reviewed systems are negative.  Source of breath Memory loss  Blood pressure (!) 198/83, pulse (!) 53, height 5' 2.5" (1.588 m), weight 130 lb 8 oz (59.2 kg), last menstrual period 05/30/2003.   A repeat blood pressure, right arm, was 224/94.  Physical Exam  General: The patient is alert and cooperative at the time of the examination.  Skin: No significant peripheral edema is noted.   Neurologic Exam  Mental status: The patient is alert and oriented x 3 at the time of the examination. The patient has apparent normal recent and remote memory, with an apparently normal attention span and concentration ability. Mini-Mental Status  Examination done today shows a total score of 30/30.   Cranial nerves: Facial symmetry is present. Speech is normal, no aphasia or dysarthria is noted. Extraocular movements are full. Visual fields are full.  Motor: The patient has good strength in all 4 extremities.  Sensory examination: Soft touch sensation is symmetric on the face, arms, and legs.  Coordination: The patient has good finger-nose-finger and heel-to-shin bilaterally.  Gait and station: The patient has a normal gait. Tandem gait is normal. Romberg is negative. No drift is seen.  Reflexes: Deep tendon  reflexes are symmetric.   Assessment/Plan:  1. Mild memory disturbance  2. Elevated blood pressures  The patient was asked to contact her primary care physician given the significantly elevated blood pressures today. The patient has had some mid chest pressure sensations, shortness of breath, she may require a cardiac evaluation if her symptoms do not improve with treatment of her gastroesophageal reflux disease. The patient will follow-up through this office in one year, sooner if needed. We will follow the memory issues conservatively.  Carla Alexanders MD 12/12/2016 10:12 AM  [facility] [address] [place], [place] [address]  Phone [phone] Fax [phone]

## 2016-12-12 NOTE — Patient Instructions (Signed)
   Blood pressures today were 198/83, a repeat was 224/94, please call your primary care doctor.

## 2017-07-23 ENCOUNTER — Encounter: Payer: Self-pay | Admitting: Obstetrics & Gynecology

## 2017-12-10 ENCOUNTER — Encounter

## 2017-12-10 ENCOUNTER — Encounter: Payer: Self-pay | Admitting: Obstetrics & Gynecology

## 2017-12-10 ENCOUNTER — Other Ambulatory Visit: Payer: Self-pay

## 2017-12-10 ENCOUNTER — Ambulatory Visit (INDEPENDENT_AMBULATORY_CARE_PROVIDER_SITE_OTHER): Payer: Medicare Other | Admitting: Obstetrics & Gynecology

## 2017-12-10 ENCOUNTER — Other Ambulatory Visit (HOSPITAL_COMMUNITY)
Admission: RE | Admit: 2017-12-10 | Discharge: 2017-12-10 | Disposition: A | Discharge status: Home / Self Care | Payer: Medicare Other | Source: Ambulatory Visit | Attending: Obstetrics & Gynecology | Admitting: Obstetrics & Gynecology

## 2017-12-10 VITALS — BP 144/86 | HR 60 | Resp 14 | Ht 62.25 in | Wt 131.6 lb

## 2017-12-10 DIAGNOSIS — Z124 Encounter for screening for malignant neoplasm of cervix: Secondary | ICD-10-CM

## 2017-12-10 DIAGNOSIS — Z01419 Encounter for gynecological examination (general) (routine) without abnormal findings: Secondary | ICD-10-CM

## 2017-12-10 NOTE — Progress Notes (Signed)
67 y.o. [id number] MarriedCaucasianF here for annual exam.  Son got married last year.  They are hoping to have a baby and she is on anti-depressants.  Daughter in law stopped her medications and this has really not been good.    Last year, after seeing Dr. Jannifer Franklin, BP was elevated.  Atenolol was adjusted.  Has appt next week with him for yearly visit.  Patient's last menstrual period was 05/30/2003.          Sexually active: Yes.    The current method of family planning is post menopausal status.    Exercising: Yes.    machines at the gym, aerobics  Smoker:  no  Health Maintenance: Pap:  08/05/15 Neg   02/05/14 Neg  History of abnormal Pap:  yes MMG:  07/16/17 BIRADS1:Neg  Colonoscopy:  03/03/13 f/u 5 years  BMD:   07/12/16 Osteopenia  TDaP:  2011 Pneumonia vaccine(s): done Shingrix: completed  09/2016 Hep C testing: Donated blood Screening Labs: PCP   reports that she has never smoked. She has never used smokeless tobacco. She reports that she drinks about 0.6 oz of alcohol per week. She reports that she does not use drugs.  Past Medical History:  Diagnosis Date  . Acid reflux   . Anemia   . Anxiety   . Diverticulosis   . Hiatal hernia   . Hypertension    off medication since 9/13  . Hypertension   . Iron deficiency anemia 03/19/2014  . 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 Memory change 08/16/2015    Past Surgical History:  Procedure Laterality Date  . COLONOSCOPY WITH ESOPHAGOGASTRODUODENOSCOPY (EGD)      Current Outpatient Medications  Medication Sig Dispense Refill  . ALPRAZolam (XANAX) 0.25 MG tablet 0.25 mg as needed.    Marland Kitchen aspirin EC 81 MG tablet Take 81 mg by mouth daily.    Marland Kitchen atenolol (TENORMIN) 100 MG tablet Take 1 tablet by mouth daily.  7  . cholecalciferol (VITAMIN D) 1000 units tablet Take 2,000 Units by mouth daily.     . hydrocortisone (ANUSOL-HC) 2.5 % rectal cream apply rectally twice a day (Patient taking differently: apply rectally prn) 30 g 1  . calcium carbonate  (TUMS - DOSED IN MG ELEMENTAL CALCIUM) 500 MG chewable tablet Chew 2 tablets by mouth daily as needed.      No current facility-administered medications for this visit.     Family History  Problem Relation Age of Onset  . Stroke Mother   . Dementia Mother   . Cancer Brother        lymphoma    Review of Systems  Endo/Heme/Allergies:       Bruises   All other systems reviewed and are negative.   Exam:   BP (!) 144/86 (BP Location: Left Arm, Patient Position: Sitting, Cuff Size: Normal)   Pulse 60   Resp 14   Ht 5' 2.25" (1.581 m)   Wt 131 lb 9.6 oz (59.7 kg)   LMP 05/30/2003   BMI 23.88 kg/m     Height: 5' 2.25" (158.1 cm)  Ht Readings from Last 3 Encounters:  12/10/17 5' 2.25" (1.581 m)  12/12/16 5' 2.5" (1.588 m)  11/16/16 5' 2.5" (1.588 m)    General appearance: alert, cooperative and appears stated age Head: Normocephalic, without obvious abnormality, atraumatic Neck: no adenopathy, supple, symmetrical, trachea midline and thyroid normal to inspection and palpation Lungs: clear to auscultation bilaterally Breasts: normal appearance, no masses or tenderness Heart: regular rate and rhythm  Abdomen: soft, non-tender; bowel sounds normal; no masses,  no organomegaly Extremities: extremities normal, atraumatic, no cyanosis or edema Skin: Skin color, texture, turgor normal. No rashes or lesions Lymph nodes: Cervical, supraclavicular, and axillary nodes normal. No abnormal inguinal nodes palpated Neurologic: Grossly normal   Pelvic: External genitalia:  no lesions              Urethra:  normal appearing urethra with no masses, tenderness or lesions              Bartholins and Skenes: normal                 Vagina: normal appearing vagina with normal color and discharge, no lesions              Cervix: no lesions              Pap taken: No. Bimanual Exam:  Uterus:  normal size, contour, position, consistency, mobility, non-tender              Adnexa: normal adnexa and  no mass, fullness, tenderness               Rectovaginal: Confirms               Anus:  normal sphincter tone, no lesions  Chaperone was present for exam.  A:  Well Woman with normal exam PMP, on HRT Mild memory changes, followed by Dr. Jannifer Franklin H/o microscopic hematuria Melanoma found last year.  Seeing derm every six months now.  P:   Mammogram guidelines reviewed pap smear obtained today Will see Dr. Stephanie Acre later this year Vaccines are UTD Return annually or prn

## 2017-12-11 NOTE — Progress Notes (Signed)
[facility]  PATIENT: Carla Ramirez DOB: Mar 17, 1951   REASON FOR VISIT: Follow-up for mild memory disturbance HISTORY FROM: Patient    HISTORY OF PRESENT ILLNESS:UPDATE 7/17/2019CM Carla Ramirez 67 year old female returns for follow-up with mild memory disturbance.  Memory score is stable at 30 out of 30.  She continues to drive without difficulty.  She exercises 6 days a week.  She has trouble remembering names of people.  If she is stressed her cognitive function is worse.  She denies issues with keeping up with her medications appointments or her finances. Blood pressure was noted to be elevated in the office today she claims she is compliant with her medications she returns for reevaluation 7/17/18[name]. Carla Ramirez is a 67 year old right-handed white female with a history of a mild memory disturbance. The patient has done well since last seen 9 months ago without any significant change in her cognitive functioning level. She was planning a wedding recently, and under stress she noted some problems with her cognitive functioning, this has improved once the stress level has reduced. The patient has had some issues with reflux with some discomfort in the mid chest area, some shortness of breath with walking. She has been noted to have an elevated blood pressure today in the office. The patient denies any issues with operating a motor vehicle, she denies problems keeping up with medications or appointments or difficulty with managing the finances. She is not on any medications for memory.    REVIEW OF SYSTEMS: Full 14 system review of systems performed and notable only for those listed, all others are neg:  Constitutional: neg  Cardiovascular: neg Ear/Nose/Throat: neg  Skin: neg Eyes: neg Respiratory: neg Gastroitestinal: neg  Hematology/Lymphatic: neg  Endocrine: neg Musculoskeletal:neg Allergy/Immunology: neg Neurological: Mild memory loss Psychiatric: neg Sleep :  neg   ALLERGIES: Allergies  Allergen Reactions  . Sulfa Antibiotics Swelling    Facial swelling    HOME MEDICATIONS: Outpatient Medications Prior to Visit  Medication Sig Dispense Refill  . ALPRAZolam (XANAX) 0.25 MG tablet 0.25 mg as needed.    Marland Kitchen aspirin EC 81 MG tablet Take 81 mg by mouth daily.    Marland Kitchen atenolol (TENORMIN) 100 MG tablet Take 1 tablet by mouth daily.  7  . cholecalciferol (VITAMIN D) 1000 units tablet Take 2,000 Units by mouth daily.     . hydrocortisone (ANUSOL-HC) 2.5 % rectal cream apply rectally twice a day (Patient taking differently: apply rectally prn) 30 g 1  . calcium carbonate (TUMS - DOSED IN MG ELEMENTAL CALCIUM) 500 MG chewable tablet Chew 2 tablets by mouth daily as needed.      No facility-administered medications prior to visit.     PAST MEDICAL HISTORY: Past Medical History:  Diagnosis Date  . Acid reflux   . Anemia   . Anxiety   . Diverticulosis   . Hiatal hernia   . Hypertension    off medication since 9/13  . Hypertension   . Iron deficiency anemia 03/19/2014  . 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 Melanoma ([place]) 2018   On back.  Dr. Ubaldo Glassing follows.  . Memory change 08/16/2015    PAST SURGICAL HISTORY: Past Surgical History:  Procedure Laterality Date  . COLONOSCOPY WITH ESOPHAGOGASTRODUODENOSCOPY (EGD)      FAMILY HISTORY: Family History  Problem Relation Age of Onset  . Stroke Mother   . Dementia Mother   . Cancer Brother        lymphoma    SOCIAL HISTORY: Social History  Socioeconomic History  . Marital status: Married    Spouse name: Not on file  . Number of children: 3  . Years of education: Masters  . Highest education level: Not on file  Occupational History  . Not on file  Social Needs  . Financial resource strain: Not on file  . Food insecurity:    Worry: Not on file    Inability: Not on file  . Transportation needs:    Medical: Not on file    Non-medical: Not on file  Tobacco Use  . Smoking status: Never  Smoker  . Smokeless tobacco: Never Used  Substance and Sexual Activity  . Alcohol use: Yes    Alcohol/week: 0.6 oz    Types: 1 Standard drinks or equivalent per week  . Drug use: No  . Sexual activity: Yes    Partners: Male    Birth control/protection: Post-menopausal  Lifestyle  . Physical activity:    Days per week: Not on file    Minutes per session: Not on file  . Stress: Not on file  Relationships  . Social connections:    Talks on phone: Not on file    Gets together: Not on file    Attends religious service: Not on file    Active member of club or organization: Not on file    Attends meetings of clubs or organizations: Not on file    Relationship status: Not on file  . Intimate partner violence:    Fear of current or ex partner: Not on file    Emotionally abused: Not on file    Physically abused: Not on file    Forced sexual activity: Not on file  Other Topics Concern  . Not on file  Social History Narrative   Married lives with hsuband at home.  Retired.  Has Masters degree.  Has 3 children.    Right-handed   Caffeine:     PHYSICAL EXAM  Vitals:   12/12/17 1234  BP: (!) 166/85  Pulse: (!) 57  Weight: 130 lb 6.4 oz (59.1 kg)  Height: 5' 2.25" (1.581 Carla)   Body mass index is 23.66 kg/Carla.  Generalized: Well developed, in no acute distress  Head: normocephalic and atraumatic,. Oropharynx benign  Neck: Supple,  Musculoskeletal: No deformity   Neurological examination   Mentation: Alert AFT 19 Clock drawing 4/4 MMSE - Mini Mental State Exam 12/12/2017 12/12/2016 02/23/2016  Orientation to time 5 5 5   Orientation to Place 5 5 5   Registration 3 3 3   Attention/ Calculation 5 5 5   Recall 3 3 3   Language- name 2 objects 2 2 2   Language- repeat 1 1 1   Language- follow 3 step command 3 3 3   Language- read & follow direction 1 1 1   Write a sentence 1 1 1   Copy design 1 1 1   Total score 30 30 30     Follows all commands speech and language fluent.   Cranial  nerve II-XII: Pupils were equal round reactive to light extraocular movements were full, visual field were full on confrontational test. Facial sensation and strength were normal. hearing was intact to finger rubbing bilaterally. Uvula tongue midline. head turning and shoulder shrug were normal and symmetric.Tongue protrusion into cheek strength was normal. Motor: normal bulk and tone, full strength in the BUE, BLE, fine finger movements normal, no pronator drift. No focal weakness Sensory: normal and symmetric to light touch, pinprick, and  Vibration, in the upper and lower extremities Coordination: finger-nose-finger,  heel-to-shin bilaterally, no dysmetria Reflexes: Symmetric upper and lower, plantar responses were flexor bilaterally. Gait and Station: Rising up from seated position without assistance, normal stance,  moderate stride, good arm swing, smooth turning, able to perform tiptoe, and heel walking without difficulty. Tandem gait is steady  DIAGNOSTIC DATA (LABS, IMAGING, TESTING) - I reviewed patient records, labs, notes, testing and imaging myself where available.  Lab Results  Component Value Date   WBC 5.2 04/15/2014   HGB 13.1 04/15/2014   HCT 38.8 04/15/2014   MCV 89.4 04/15/2014   PLT 218 04/15/2014    Lab Results  Component Value Date   HFSFSELT53 202 08/16/2015    ASSESSMENT AND PLAN  67 y.o. year old female  has a past medical history of Acid reflux, Anemia, Anxiety, Diverticulosis, Hiatal hernia, Hypertension, Hypertension, Iron deficiency anemia (03/19/2014), Leukopenia (03/19/2014), Melanoma ([place]) (2018), and Memory change (08/16/2015). here to follow-up for mild memory disturbance  Memory is stable 30/30 Follow up yearly Dennie Bible, [facility], [facility], [facility] [address] [place], [place] [address] [phone]

## 2017-12-12 ENCOUNTER — Encounter: Payer: Self-pay | Admitting: Nurse Practitioner

## 2017-12-12 ENCOUNTER — Ambulatory Visit: Payer: Medicare Other | Admitting: Nurse Practitioner

## 2017-12-12 VITALS — BP 166/85 | HR 57 | Ht 62.25 in | Wt 130.4 lb

## 2017-12-12 DIAGNOSIS — I10 Essential (primary) hypertension: Secondary | ICD-10-CM

## 2017-12-12 DIAGNOSIS — R413 Other amnesia: Secondary | ICD-10-CM | POA: Diagnosis not present

## 2017-12-12 LAB — CYTOLOGY - PAP
Diagnosis: NEGATIVE
HPV (WINDOPATH): NOT DETECTED

## 2017-12-12 NOTE — Patient Instructions (Signed)
Memory is stable  Follow up yearly

## 2018-04-02 ENCOUNTER — Telehealth: Payer: Self-pay | Admitting: Obstetrics & Gynecology

## 2018-04-02 NOTE — Telephone Encounter (Signed)
Katherine from [facility] left a voicemail after hours requesting the patients most recent mammogram. Belenda Cruise stated that records could be faxed over to [phone].

## 2018-04-03 NOTE — Telephone Encounter (Signed)
This has been completed.

## 2018-04-03 NOTE — Telephone Encounter (Signed)
Can you assist with records request? Thank you

## 2018-07-17 ENCOUNTER — Encounter: Payer: Self-pay | Admitting: Obstetrics & Gynecology

## 2018-11-18 ENCOUNTER — Telehealth: Payer: Self-pay | Admitting: Obstetrics & Gynecology

## 2018-11-18 NOTE — Telephone Encounter (Signed)
Patient has a "boil" that she would like to see Dr.Miller.

## 2018-11-18 NOTE — Telephone Encounter (Signed)
Patient returned call to provide update. Has a WebEx visit with her PCP today at 4pm, will keep OV as scheduled for 6/23 with Dr. Sabra Heck.   Routing to Dr. Lestine Box.

## 2018-11-18 NOTE — Telephone Encounter (Signed)
Spoke with patient. Patient reports a sore area in her perirectal area, noticed on  6/19, unable to visualize, thinks it may be a boil. Applied Anusol topically, states "boil" is gone, just sore now. Denies any discharge. Denies any other GYN symptoms.   Patient reports she also would like to discuss her B/P increasing. Checked her B/P at home 200-202 /89-91, is on atenolol prescribed by PCP. Denies headache, pain, SHOB. Recommended patient f/u with her PCP for further evaluation of B/P. Patient will call directly to schedule appt.   OV scheduled for 6/23 at 4:30pm with Dr. Sabra Heck for further evaluation of perirectal area. Patient states if she sees her PCP today will have her take a look, will return call to cancel OV with Dr. Sabra Heck if not needed.   Routing to provider for final review. Patient is agreeable to disposition. Will close encounter.

## 2018-11-19 ENCOUNTER — Other Ambulatory Visit: Payer: Self-pay

## 2018-11-19 ENCOUNTER — Ambulatory Visit: Payer: Medicare Other | Admitting: Obstetrics & Gynecology

## 2018-11-19 VITALS — BP 160/84 | HR 64 | Temp 97.9°F | Ht 65.25 in | Wt 135.0 lb

## 2018-11-19 DIAGNOSIS — K611 Rectal abscess: Secondary | ICD-10-CM

## 2018-11-19 DIAGNOSIS — I10 Essential (primary) hypertension: Secondary | ICD-10-CM | POA: Diagnosis not present

## 2018-11-19 MED ORDER — DOXYCYCLINE HYCLATE 100 MG PO CAPS: 100.0000 mg | ORAL_CAPSULE | Freq: Two times a day (BID) | ORAL | 0 refills | Status: DC

## 2018-11-19 NOTE — Patient Instructions (Addendum)
Carla Saas, MD  [place] [name], [name]  [place]: [phone]  BP today:  160/84 Your machine:  179/81  Second check:  180/88 Your machine:  848/59  Systolic number is about 20 points higher on your machine.

## 2018-11-19 NOTE — Progress Notes (Signed)
GYNECOLOGY  VISIT  CC:  ? External bump - hemorrhoids   HPI: 68 y.o. G41P2003 Married White or Caucasian female here for complaint of perirectal "bump" that she noted last Friday.  This was a little tender but was improved about a day later.  She used Proctozone HC that she had at home.  Wonders if this is a hemorrhoid, as suggested by a friend.  Did not have any rectal bleeding or discharge to the best of her knowledge.  No issues with constipation.  No fevers.    Has some questions about her BP.  She has been monitoring her BPs at home since June 11th.  She didn't really have much of a reason to start this but just decided this was a good idea.  BPs were as follows: 6/11:  179/85 6/14:  186/73 6/15: 161/76 6/16: 178/79 6/17:  169/76 6/19:  171/78 6/22:  200/89, 191/77, 186/77 6/23: 182/78, 160/84, 179/81.  Brought her BP cuff with her for comparison here today.  BP taken twice with two separate cuffs and her BP cuff is about 20 points higher systolic that both manual readings that we took.  GYNECOLOGIC HISTORY: Patient's last menstrual period was 05/30/2003. Contraception: PMP Menopausal hormone therapy: none  Patient Active Problem List   Diagnosis Date Noted  . Memory change 08/16/2015  . 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 Iron deficiency anemia 03/19/2014  . Essential hypertension, benign 02/05/2014  . Hemorrhoids 02/05/2014    Past Medical History:  Diagnosis Date  . Acid reflux   . Anemia   . Anxiety   . Diverticulosis   . Hiatal hernia   . Hypertension    off medication since 9/13  . Hypertension   . Iron deficiency anemia 03/19/2014  . 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 Melanoma ([place]) 2018   On back.  Dr. Ubaldo Glassing follows.  . Memory change 08/16/2015    Past Surgical History:  Procedure Laterality Date  . COLONOSCOPY WITH ESOPHAGOGASTRODUODENOSCOPY (EGD)      MEDS:   Current Outpatient Medications on File Prior to Visit  Medication Sig Dispense Refill  . ALPRAZolam (XANAX)  0.25 MG tablet 0.25 mg as needed.    Marland Kitchen aspirin EC 81 MG tablet Take 81 mg by mouth daily.    Marland Kitchen atenolol (TENORMIN) 100 MG tablet Take 1 tablet by mouth daily.  7  . cholecalciferol (VITAMIN D) 1000 units tablet Take 2,000 Units by mouth daily.     . hydrocortisone (ANUSOL-HC) 2.5 % rectal cream apply rectally twice a day (Patient taking differently: apply rectally prn) 30 g 1   No current facility-administered medications on file prior to visit.     ALLERGIES: Sulfa antibiotics  Family History  Problem Relation Age of Onset  . Stroke Mother   . Dementia Mother   . Cancer Brother        lymphoma    SH:  Married, no smoker  Review of Systems  All other systems reviewed and are negative.   PHYSICAL EXAMINATION:    BP (!) 160/84   Pulse 64   Temp 97.9 F (36.6 C) (Temporal)   Ht 5' 5.25" (1.657 m)   Wt 135 lb (61.2 kg)   LMP 05/30/2003   BMI 22.29 kg/m     Physical Exam  Constitutional: She is oriented to person, place, and time. She appears well-developed and well-nourished.  Genitourinary:       Genitourinary Comments: Rectal exam was normal except for presence of small internal hemorrhoids.  No evidence  of lesion originating from rectum.   Lymphadenopathy:       Right: No inguinal adenopathy present.       Left: No inguinal adenopathy present.  Neurological: She is alert and oriented to person, place, and time.  Skin: Skin is warm and dry.  Psychiatric: She has a normal mood and affect.   Chaperone was present for exam.  Assessment: Perirectal lesion most consistent with ruptured abscess Hypertension  Plan: HSV PCR obtained today as well as a wound culture Will start doxycycline 100mg  bid x 7 days.  #14/0RF.  Pt will return for recheck in 7-10 days to ensure induration is improving She is going to follow-up with Dr. Stephanie Acre about hypertension

## 2018-11-20 ENCOUNTER — Telehealth: Payer: Self-pay | Admitting: *Deleted

## 2018-11-20 NOTE — Telephone Encounter (Signed)
Journey calling from [place] to confirm Rx recived on 11/19/18 for doxycycline 100 mg instructions. Order received to dispense #14 caps/0RF.   New V.O given for Doxycycline 100 mg bid PO x14 days #28/0RF. Read back and confirmed.   Routing to provider for final review. Patient is agreeable to disposition. Will close encounter.

## 2018-11-22 ENCOUNTER — Encounter: Payer: Self-pay | Admitting: Obstetrics & Gynecology

## 2018-11-23 LAB — HERPES SIMPLEX VIRUS(HSV) DNA BY PCR
HSV 2 DNA: NEGATIVE
HSV-1 DNA: NEGATIVE

## 2018-11-24 LAB — WOUND CULTURE

## 2018-11-25 ENCOUNTER — Telehealth: Payer: Self-pay | Admitting: Obstetrics & Gynecology

## 2018-11-25 NOTE — Telephone Encounter (Signed)
Patient notified okay to stop Doxycycline.

## 2018-11-25 NOTE — Telephone Encounter (Signed)
Routing question to Dr. Sabra Heck.

## 2018-11-25 NOTE — Telephone Encounter (Signed)
Patient is calling regarding results. Patient is questioning if she needs to continue taking the antibiotics that she was prescribed or to discontinue them.

## 2018-11-25 NOTE — Telephone Encounter (Signed)
She can stop them.  Thanks.

## 2018-11-26 ENCOUNTER — Other Ambulatory Visit: Payer: Self-pay

## 2018-11-28 ENCOUNTER — Other Ambulatory Visit: Payer: Self-pay

## 2018-11-28 ENCOUNTER — Encounter: Payer: Self-pay | Admitting: Obstetrics & Gynecology

## 2018-11-28 ENCOUNTER — Ambulatory Visit: Payer: Medicare Other | Admitting: Obstetrics & Gynecology

## 2018-11-28 VITALS — BP 166/82 | HR 64 | Temp 97.6°F | Ht 65.25 in | Wt 134.0 lb

## 2018-11-28 DIAGNOSIS — L0292 Furuncle, unspecified: Secondary | ICD-10-CM

## 2018-11-28 DIAGNOSIS — K649 Unspecified hemorrhoids: Secondary | ICD-10-CM

## 2018-11-28 DIAGNOSIS — I10 Essential (primary) hypertension: Secondary | ICD-10-CM | POA: Diagnosis not present

## 2018-11-28 MED ORDER — HYDROCORTISONE (PERIANAL) 2.5 % EX CREA: TOPICAL_CREAM | Freq: Two times a day (BID) | CUTANEOUS | 1 refills | Status: DC

## 2018-11-28 NOTE — Patient Instructions (Signed)
[facility] [place].  [place], [place] [address]  [phone]  Dr. Hulan Saas

## 2018-11-28 NOTE — Progress Notes (Signed)
GYNECOLOGY  VISIT  CC:   Lesion f/u   HPI: 68 y.o. G7P2003 Married White or Caucasian female here for perirectal lesion follow up.  Reports the area on her buttocks feels much better.  There is no tenderness and no drainage.  Culture reviewed with pt.  It showed Stenotrophomonas maltophilia, a typically multi drug resistant bacteria.  As pt was improving, new antibiotics were not prescribed.  Questions answered regarding this.  She stopped her Meloxicam as the prescription ran out.  She is having some joint issues in her feet and hands.  May want to see sports medicine provider at some point.  Information given to her about this.  She would like rx for hydrocotisone to use with hemorrhoid issues.  Lastly, she does want me to recheck her BP today.  It is 170/80.  She is being followed for this by Dr. Stephanie Acre.  Losartan recently added.  Is considering getting a new BP cuff for home as hers is not accurate.  Denies HA.  Does have appt next week with Dr. Stephanie Acre.  GYNECOLOGIC HISTORY: Patient's last menstrual period was 05/30/2003. Contraception: PMP Menopausal hormone therapy: none  Patient Active Problem List   Diagnosis Date Noted  . Memory change 08/16/2015  . 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 Iron deficiency anemia 03/19/2014  . Essential hypertension, benign 02/05/2014  . Hemorrhoids 02/05/2014    Past Medical History:  Diagnosis Date  . Acid reflux   . Anemia   . Anxiety   . Diverticulosis   . Hiatal hernia   . Hypertension    off medication since 9/13  . Hypertension   . Iron deficiency anemia 03/19/2014  . 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 Melanoma ([place]) 2018   On back.  Dr. Ubaldo Glassing follows.  . Memory change 08/16/2015    Past Surgical History:  Procedure Laterality Date  . COLONOSCOPY WITH ESOPHAGOGASTRODUODENOSCOPY (EGD)      MEDS:   Current Outpatient Medications on File Prior to Visit  Medication Sig Dispense Refill  . ALPRAZolam (XANAX) 0.25 MG tablet 0.25 mg as needed.    Marland Kitchen  atenolol (TENORMIN) 100 MG tablet Take 1 tablet by mouth daily.  7  . Calcium Carbonate-Vit D-Min (CALCIUM 1200 PO) Take by mouth daily.    . cholecalciferol (VITAMIN D) 1000 units tablet Take 2,000 Units by mouth daily.     . hydrocortisone (ANUSOL-HC) 2.5 % rectal cream apply rectally twice a day (Patient taking differently: apply rectally prn) 30 g 1  . losartan (COZAAR) 25 MG tablet TK 1 T PO QD     No current facility-administered medications on file prior to visit.     ALLERGIES: Sulfa antibiotics  Family History  Problem Relation Age of Onset  . Stroke Mother   . Dementia Mother   . Cancer Brother        lymphoma    SH:  Married, non smoker  Review of Systems  All other systems reviewed and are negative.   PHYSICAL EXAMINATION:    BP (!) 166/82   Pulse 64   Temp 97.6 F (36.4 C) (Temporal)   Ht 5' 5.25" (1.657 m)   Wt 134 lb (60.8 kg)   LMP 05/30/2003   BMI 22.13 kg/m     General appearance: alert, cooperative and appears stated age Lymph:  no inguinal LAD noted  Pelvic: External genitalia:  Peri rectal lesion to the left of her buttocks is much smaller and erythema and induration is all resolved  Chaperone was present for  exam.  Assessment: Perirectal furuncle that is mostly healed Hypertension Hemorrhoids  Plan: Pt knows to call if has any new issues Rx for hydrocortisone cream 2.5% every 4-6 hours as needed for hemorrhoidal pain/itching Has follow with planned with Dr. Stephanie Acre next week.   ~15 minutes spent with patient >50% of time was in face to face discussion of above.

## 2018-12-05 ENCOUNTER — Telehealth: Payer: Self-pay | Admitting: Obstetrics & Gynecology

## 2018-12-05 ENCOUNTER — Encounter: Payer: Self-pay | Admitting: Obstetrics & Gynecology

## 2018-12-05 ENCOUNTER — Ambulatory Visit: Payer: Medicare Other | Admitting: Obstetrics & Gynecology

## 2018-12-05 VITALS — BP 160/90 | HR 68 | Temp 97.6°F | Resp 14 | Ht 62.75 in | Wt 131.2 lb

## 2018-12-05 DIAGNOSIS — K648 Other hemorrhoids: Secondary | ICD-10-CM | POA: Diagnosis not present

## 2018-12-05 DIAGNOSIS — I10 Essential (primary) hypertension: Secondary | ICD-10-CM | POA: Diagnosis not present

## 2018-12-05 NOTE — Telephone Encounter (Signed)
Made appointment with Dr.Miller today at 3:15pm. Patient aware this is a work in appointment.

## 2018-12-05 NOTE — Telephone Encounter (Signed)
Return call to patient. Noted soft boil type area similar to previous abscess. Area just inside anus, not painful, no bleeding. No fever.  Unsure if hemorrhoid or abscess but is similar to abscess last week.  Advised will review with Dr Sabra Heck and call back.

## 2018-12-05 NOTE — Telephone Encounter (Signed)
Patient has a soft boil or lesion near her anal area.

## 2018-12-05 NOTE — Progress Notes (Signed)
GYNECOLOGY  VISIT  CC:   Possible rectal lesion  HPI: 68 y.o. G67P2003 Married White or Caucasian female here for evaluation of soft lesion the is just inside anus.  Pt had a perirectal abscess 11/19/2018 and it was draining day of visit.  Wound culture obtained showing Stenotrophomonas maltophilia--a typically multidrug resistant bacteria.  Lesion healed on own as doxycycline was prescribe but stopped after culture resulted and lesion was improving.   Pt has been monitoring herself closely and felt something new that she wanted evaluated.  Denies pain or bleeding.  Denies fever.  Denies any new vaginal discharge.  Asked me to recheck BP today.  It is still elevated at 160/90.  Has follow up with Dr. Stephanie Acre next week.  GYNECOLOGIC HISTORY: Patient's last menstrual period was 05/30/2003. Contraception: PMP Menopausal hormone therapy: none  Patient Active Problem List   Diagnosis Date Noted  . Memory change 08/16/2015  . 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 Iron deficiency anemia 03/19/2014  . Essential hypertension, benign 02/05/2014  . Hemorrhoids 02/05/2014    Past Medical History:  Diagnosis Date  . Acid reflux   . Anemia   . Anxiety   . Diverticulosis   . Hiatal hernia   . Hypertension    off medication since 9/13  . Hypertension   . Iron deficiency anemia 03/19/2014  . 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 Melanoma ([place]) 2018   On back.  Dr. Ubaldo Glassing follows.  . Memory change 08/16/2015    Past Surgical History:  Procedure Laterality Date  . COLONOSCOPY WITH ESOPHAGOGASTRODUODENOSCOPY (EGD)      MEDS:   Current Outpatient Medications on File Prior to Visit  Medication Sig Dispense Refill  . ALPRAZolam (XANAX) 0.25 MG tablet 0.25 mg as needed.    Marland Kitchen atenolol (TENORMIN) 100 MG tablet Take 1 tablet by mouth daily.  7  . Calcium Carbonate-Vit D-Min (CALCIUM 1200 PO) Take by mouth daily.    . cholecalciferol (VITAMIN D) 1000 units tablet Take 2,000 Units by mouth daily.     . hydrocortisone  (ANUSOL-HC) 2.5 % rectal cream Place rectally 2 (two) times daily. Use for up to 7 days. 30 g 1  . losartan (COZAAR) 25 MG tablet TK 1 T PO QD    . pantoprazole (PROTONIX) 20 MG tablet TK 1 T PO QD     No current facility-administered medications on file prior to visit.     ALLERGIES: Sulfa antibiotics  Family History  Problem Relation Age of Onset  . Stroke Mother   . Dementia Mother   . Cancer Brother        lymphoma    SH:  Married, non smoker  Review of Systems  Gastrointestinal:       ? "soft area" in anal region     PHYSICAL EXAMINATION:    BP (!) 160/90 (BP Location: Left Arm, Patient Position: Sitting, Cuff Size: Normal)   Pulse 68   Temp 97.6 F (36.4 C) (Temporal)   Resp 14   Ht 5' 2.75" (1.594 m)   Wt 131 lb 4 oz (59.5 kg)   LMP 05/30/2003   BMI 23.44 kg/m     General appearance: alert, cooperative and appears stated age Lymph:  no inguinal LAD noted  Pelvic: External genitalia:  no lesions              Urethra:  normal appearing urethra with no masses, tenderness or lesions              Bartholins and  Skenes: normal                 Vagina: normal appearing vagina with normal color and discharge, no lesions               Anus:  Small internal hemorrhoid at about 11 o'clock, normal sphincter tone, no lesions, area of prior abscess is well healed  Chaperone was present for exam.  Assessment: Internal hemorrhoids Hypertension  Plan: Pt already has rx for hydrocortisone 2.5% cream to be applied internally every 4-6 hours as needed. She is going to purchase new BP cuff and does have follow up with Dr. Stephanie Acre next week.

## 2018-12-12 ENCOUNTER — Other Ambulatory Visit: Payer: Self-pay | Admitting: Family Medicine

## 2018-12-12 DIAGNOSIS — I701 Atherosclerosis of renal artery: Secondary | ICD-10-CM

## 2018-12-12 DIAGNOSIS — I10 Essential (primary) hypertension: Secondary | ICD-10-CM

## 2018-12-17 NOTE — Progress Notes (Signed)
PATIENT: Carla Ramirez DOB: 02-02-1951  REASON FOR VISIT: follow up HISTORY FROM: patient  HISTORY OF PRESENT ILLNESS: Today 12/18/18  Carla Ramirez is a 68 year old female with history of mild memory disturbance.  Her memory score has been stable at 30/30. Today her score is 28/30. With COVID, she is not used to not being active, not able to volunteer. Her memory tends to worsen when she is stressed. 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  She reports at times she feels that she has trouble remembering words.  She says her memory troubles are exacerbated during times of stress.  She does take Xanax as needed.  She lives with her husband, performs all of her own ADLs.  She does drive a car without difficulty.  She manages her medications and her appointments independently.  She is very active in high functioning.  She has been started on a new blood pressure medication, losartan in the last 2 weeks.  She presents today for follow-up.  HISTORY 7/17/2019CM Carla Ramirez 68 year old female returns for follow-up with mild memory disturbance.  Memory score is stable at 30 out of 30.  She continues to drive without difficulty.  She exercises 6 days a week.  She has trouble remembering names of people.  If she is stressed her cognitive function is worse.  She denies issues with keeping up with her medications appointments or her finances. Blood pressure was noted to be elevated in the office today she claims she is compliant with her medications she returns for reevaluation  REVIEW OF SYSTEMS: Out of a complete 14 system review of symptoms, the patient complains only of the following symptoms, and all other reviewed systems are negative.  Anxiety, memory loss  ALLERGIES: Allergies  Allergen Reactions  . Sulfa Antibiotics Swelling    Facial swelling    HOME MEDICATIONS: Outpatient Medications Prior to Visit  Medication Sig Dispense Refill  . ALPRAZolam (XANAX) 0.25  MG tablet 0.25 mg as needed.    Marland Kitchen atenolol (TENORMIN) 100 MG tablet Take 1 tablet by mouth daily.  7  . Calcium Carbonate-Vit D-Min (CALCIUM 1200 PO) Take by mouth daily.    . cholecalciferol (VITAMIN D) 1000 units tablet Take 2,000 Units by mouth daily.     . hydrocortisone (ANUSOL-HC) 2.5 % rectal cream Place rectally 2 (two) times daily. Use for up to 7 days. 30 g 1  . losartan (COZAAR) 25 MG tablet TK 1 T PO QD    . pantoprazole (PROTONIX) 20 MG tablet TK 1 T PO QD     No facility-administered medications prior to visit.     PAST MEDICAL HISTORY: Past Medical History:  Diagnosis Date  . Acid reflux   . Anemia   . Anxiety   . Diverticulosis   . Hiatal hernia   . Hypertension    off medication since 9/13  . Hypertension   . Iron deficiency anemia 03/19/2014  . 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 Melanoma ([place]) 2018   On back.  Dr. Ubaldo Glassing follows.  . Memory change 08/16/2015    PAST SURGICAL HISTORY: Past Surgical History:  Procedure Laterality Date  . COLONOSCOPY WITH ESOPHAGOGASTRODUODENOSCOPY (EGD)      FAMILY HISTORY: Family History  Problem Relation Age of Onset  . Stroke Mother   . Dementia Mother   . Cancer Brother        lymphoma    SOCIAL HISTORY: Social History   Socioeconomic History  .  Marital status: Married    Spouse name: Not on file  . Number of children: 3  . Years of education: Masters  . Highest education level: Not on file  Occupational History  . Not on file  Social Needs  . Financial resource strain: Not on file  . Food insecurity    Worry: Not on file    Inability: Not on file  . Transportation needs    Medical: Not on file    Non-medical: Not on file  Tobacco Use  . Smoking status: Never Smoker  . Smokeless tobacco: Never Used  Substance and Sexual Activity  . Alcohol use: Yes    Alcohol/week: 1.0 standard drinks    Types: 1 Standard drinks or equivalent per week  . Drug use: No  . Sexual activity: Yes    Partners: Male    Birth  control/protection: Post-menopausal  Lifestyle  . Physical activity    Days per week: Not on file    Minutes per session: Not on file  . Stress: Not on file  Relationships  . Social Herbalist on phone: Not on file    Gets together: Not on file    Attends religious service: Not on file    Active member of club or organization: Not on file    Attends meetings of clubs or organizations: Not on file    Relationship status: Not on file  . Intimate partner violence    Fear of current or ex partner: Not on file    Emotionally abused: Not on file    Physically abused: Not on file    Forced sexual activity: Not on file  Other Topics Concern  . Not on file  Social History Narrative   Married lives with hsuband at home.  Retired.  Has Masters degree.  Has 3 children.    Right-handed   Caffeine:      PHYSICAL EXAM  Vitals:   12/18/18 1303  BP: (!) 143/71  Pulse: (!) 57  Temp: 99.1 F (37.3 C)  Weight: 134 lb 12.8 oz (61.1 kg)  Height: 5' 2.75" (1.594 m)   Body mass index is 24.07 kg/m.  Generalized: Well developed, in no acute distress  MMSE - Mini Mental State Exam 12/18/2018 12/12/2017 12/12/2016  Orientation to time 5 5 5   Orientation to Place 5 5 5   Registration 3 3 3   Attention/ Calculation 4 5 5   Recall 2 3 3   Language- name 2 objects 2 2 2   Language- repeat 1 1 1   Language- follow 3 step command 3 3 3   Language- read & follow direction 1 1 1   Write a sentence 1 1 1   Copy design 1 1 1   Total score 28 30 30     Neurological examination  Mentation: Alert oriented to time, place, history taking. Follows all commands speech and language fluent Cranial nerve II-XII: Pupils were equal round reactive to light. Extraocular movements were full, visual field were full on confrontational test. Facial sensation and strength were normal. Uvula tongue midline. Head turning and shoulder shrug  were normal and symmetric. Motor: The motor testing reveals 5 over 5  strength of all 4 extremities. Good symmetric motor tone is noted throughout.  Sensory: Sensory testing is intact to soft touch on all 4 extremities. No evidence of extinction is noted.  Coordination: Cerebellar testing reveals good finger-nose-finger and heel-to-shin bilaterally.  Gait and station: Gait is normal. Tandem gait is normal. Romberg is negative. No drift  is seen.  Reflexes: Deep tendon reflexes are symmetric and normal bilaterally.   DIAGNOSTIC DATA (LABS, IMAGING, TESTING) - I reviewed patient records, labs, notes, testing and imaging myself where available.  Lab Results  Component Value Date   WBC 5.2 04/15/2014   HGB 13.1 04/15/2014   HCT 38.8 04/15/2014   MCV 89.4 04/15/2014   PLT 218 04/15/2014   No results found for: NA, K, CL, CO2, GLUCOSE, BUN, CREATININE, CALCIUM, PROT, ALBUMIN, AST, ALT, ALKPHOS, BILITOT, GFRNONAA, GFRAA No results found for: CHOL, HDL, LDLCALC, LDLDIRECT, TRIG, CHOLHDL No results found for: HGBA1C Lab Results  Component Value Date   VITAMINB12 533 08/16/2015   No results found for: TSH    ASSESSMENT AND PLAN 68 y.o. year old female  has a past medical history of Acid reflux, Anemia, Anxiety, Diverticulosis, Hiatal hernia, Hypertension, Hypertension, Iron deficiency anemia (03/19/2014), Leukopenia (03/19/2014), Melanoma ([place]) (2018), and Memory change (08/16/2015). here with:  1.  Memory loss 2.  Anxiety  Her memory score is stable, 28/30.  She reports that her trouble with memory is exacerbated during times of stress and anxiety.  It might be beneficial for her to try an SSRI to see if this is helpful with her anxiety, and in turn may help her memory.  She would like to discuss this with her primary care doctor.  She is very active and high functioning.  She will follow-up in 6 months or sooner if needed.  I advised that if her symptoms worsen or she develops any new symptoms she should let us know.   I spent 15 minutes with the patient.  50% of this time was spent discussing her plan of care.   Butler Denmark, AGNP-C, DNP 12/18/2018, 2:17 PM [facility] [address] [place], [place] [address] [phone]

## 2018-12-18 ENCOUNTER — Ambulatory Visit: Payer: Medicare Other | Admitting: Neurology

## 2018-12-18 ENCOUNTER — Encounter: Payer: Self-pay | Admitting: Neurology

## 2018-12-18 ENCOUNTER — Ambulatory Visit: Payer: Medicare Other | Admitting: Nurse Practitioner

## 2018-12-18 ENCOUNTER — Other Ambulatory Visit: Payer: Self-pay

## 2018-12-18 VITALS — BP 143/71 | HR 57 | Temp 99.1°F | Ht 62.75 in | Wt 134.8 lb

## 2018-12-18 DIAGNOSIS — R413 Other amnesia: Secondary | ICD-10-CM

## 2018-12-18 NOTE — Progress Notes (Signed)
I have read the note, and I agree with the clinical assessment and plan.  Braidyn Scorsone K Quincee Gittens   

## 2018-12-18 NOTE — Patient Instructions (Signed)
It was wonderful to meet you both today! Discuss with your primary care provider possibly trying an antidepressant to see if that may help with anxiety, possibly Zoloft.

## 2019-01-08 ENCOUNTER — Ambulatory Visit
Admission: RE | Admit: 2019-01-08 | Discharge: 2019-01-08 | Disposition: A | Discharge status: Home / Self Care | Payer: Medicare Other | Source: Ambulatory Visit | Attending: Family Medicine | Admitting: Family Medicine

## 2019-01-08 DIAGNOSIS — I10 Essential (primary) hypertension: Secondary | ICD-10-CM

## 2019-01-08 DIAGNOSIS — I701 Atherosclerosis of renal artery: Secondary | ICD-10-CM

## 2019-01-28 ENCOUNTER — Encounter: Payer: Self-pay | Admitting: *Deleted

## 2019-01-28 ENCOUNTER — Other Ambulatory Visit: Payer: Self-pay

## 2019-01-28 ENCOUNTER — Encounter: Payer: Self-pay | Admitting: Vascular Surgery

## 2019-01-28 ENCOUNTER — Ambulatory Visit: Payer: Self-pay | Admitting: Vascular Surgery

## 2019-01-28 ENCOUNTER — Other Ambulatory Visit: Payer: Self-pay | Admitting: *Deleted

## 2019-01-28 VITALS — BP 153/82 | HR 52 | Temp 97.2°F | Resp 18 | Ht 62.75 in | Wt 133.0 lb

## 2019-01-28 DIAGNOSIS — I701 Atherosclerosis of renal artery: Secondary | ICD-10-CM

## 2019-01-28 NOTE — Progress Notes (Signed)
Patient called and instructed to be at [facility] admitting at 6:30 am on 02/05/2019. Verbalized understanding. All other instruction per letter.

## 2019-01-28 NOTE — Progress Notes (Signed)
[facility]  Patient name: Carla Ramirez MRN: [id number] DOB: [birth year] Sex: female  REASON FOR CONSULT: Discuss diagnosis of right renal artery stenosis  HPI: Carla Ramirez is a 68 y.o. female, who is here today for discussion of recent duplex showing right renal artery stenosis.  She has had a difficult time with hypertension despite addition of medications for hypertension.  She underwent renal duplex to rule out renovascular hypertension as the cause of this and I have this for review.  This did reveal stenosis in her right renal artery with diminished renal size.  Duplex was from 01/08/2019.  She has no history of cardiac disease.  He has no history of peripheral vascular occlusive disease.  Past Medical History:  Diagnosis Date  . Acid reflux   . Anemia   . Anxiety   . Diverticulosis   . Hiatal hernia   . Hypertension    off medication since 9/13  . Hypertension   . Iron deficiency anemia 03/19/2014  . 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 Melanoma ([place]) 2018   On back.  Dr. Ubaldo Glassing follows.  . Memory change 08/16/2015    Family History  Problem Relation Age of Onset  . Stroke Mother   . Dementia Mother   . Cancer Brother        lymphoma    SOCIAL HISTORY: Social History   Socioeconomic History  . Marital status: Married    Spouse name: Not on file  . Number of children: 3  . Years of education: Masters  . Highest education level: Not on file  Occupational History  . Not on file  Social Needs  . Financial resource strain: Not on file  . Food insecurity    Worry: Not on file    Inability: Not on file  . Transportation needs    Medical: Not on file    Non-medical: Not on file  Tobacco Use  . Smoking status: Never Smoker  . Smokeless tobacco: Never Used  Substance and Sexual Activity  . Alcohol use: Yes    Alcohol/week: 1.0 standard drinks    Types: 1 Standard drinks or equivalent per week  . Drug use: No   . Sexual activity: Yes    Partners: Male    Birth control/protection: Post-menopausal  Lifestyle  . Physical activity    Days per week: Not on file    Minutes per session: Not on file  . Stress: Not on file  Relationships  . Social Herbalist on phone: Not on file    Gets together: Not on file    Attends religious service: Not on file    Active member of club or organization: Not on file    Attends meetings of clubs or organizations: Not on file    Relationship status: Not on file  . Intimate partner violence    Fear of current or ex partner: Not on file    Emotionally abused: Not on file    Physically abused: Not on file    Forced sexual activity: Not on file  Other Topics Concern  . Not on file  Social History Narrative   Married lives with hsuband at home.  Retired.  Has Masters degree.  Has 3 children.    Right-handed   Caffeine:    Allergies  Allergen Reactions  . Sulfa Antibiotics Swelling    Facial swelling    Current Outpatient Medications  Medication Sig Dispense Refill  .  ALPRAZolam (XANAX) 0.25 MG tablet 0.25 mg as needed.    Marland Kitchen atenolol (TENORMIN) 100 MG tablet Take 1 tablet by mouth daily.  7  . Calcium Carbonate-Vit D-Min (CALCIUM 1200 PO) Take by mouth daily.    . cholecalciferol (VITAMIN D) 1000 units tablet Take 2,000 Units by mouth daily.     . hydrocortisone (ANUSOL-HC) 2.5 % rectal cream Place rectally 2 (two) times daily. Use for up to 7 days. 30 g 1  . losartan (COZAAR) 50 MG tablet Take 1 tablet by mouth daily.    . Meloxicam 15 MG TBDP     . pantoprazole (PROTONIX) 20 MG tablet TK 1 T PO QD     No current facility-administered medications for this visit.     REVIEW OF SYSTEMS:  [X]  denotes positive finding, [ ]  denotes negative finding Cardiac  Comments:  Chest pain or chest pressure: x   Shortness of breath upon exertion:    Short of breath when lying flat:    Irregular heart rhythm:        Vascular    Pain in calf, thigh,  or hip brought on by ambulation:    Pain in feet at night that wakes you up from your sleep:     Blood clot in your veins:    Leg swelling:         Pulmonary    Oxygen at home:    Productive cough:     Wheezing:         Neurologic    Sudden weakness in arms or legs:     Sudden numbness in arms or legs:     Sudden onset of difficulty speaking or slurred speech:    Temporary loss of vision in one eye:     Problems with dizziness:         Gastrointestinal    Blood in stool:     Vomited blood:         Genitourinary    Burning when urinating:     Blood in urine:        Psychiatric    Major depression:         Hematologic    Bleeding problems:    Problems with blood clotting too easily:        Skin    Rashes or ulcers:        Constitutional    Fever or chills:      PHYSICAL EXAM: Vitals:   01/28/19 1103  BP: (!) 153/82  Pulse: (!) 52  Resp: 18  Temp: (!) 97.2 F (36.2 C)  SpO2: 100%  Weight: 60.3 kg  Height: 5' 2.75" (1.594 m)    GENERAL: The patient is a well-nourished female, in no acute distress. The vital signs are documented above. CARDIOVASCULAR: 2+ radial and 2+ dorsalis pedis pulses bilaterally.  Carotid arteries without bruits bilaterally.  No abdominal bruit noted. PULMONARY: There is good air exchange  ABDOMEN: Soft and non-tender  MUSCULOSKELETAL: There are no major deformities or cyanosis. NEUROLOGIC: No focal weakness or paresthesias are detected. SKIN: There are no ulcers or rashes noted. PSYCHIATRIC: The patient has a normal affect.  DATA:  Renal artery duplex suggested right renal artery stenosis on 01/08/2019.  Also significantly diminished volume of right kidney versus left.  The right kidney is 10.7 cm in total length with a volume estimated at 145 mL.  Left kidney total length is 11.4 with estimated volume of 240 mL  MEDICAL ISSUES: Had a  very long discussion with the patient regarding the significance of this.  I explained that with the  diminished right renal size and difficult to control hypertension that I would recommend arteriography for further evaluation and right renal artery angioplasty if she indeed had a flow-limiting stenosis.  I did explain the potential complications to include access issues.  I did also explained that she may simply have essential hypertension and that the renal artery narrowing is not contributing to her high blood pressure.  With her diminished renal size, I would recommend angioplasty if she does have flow-limiting stenosis.  She understands and wished to proceed.  She was introduced today to Dr. Fortunato Curling who will be doing the procedure   Rosetta Posner, MD [facility] Tel [phone] Pager [phone]

## 2019-02-01 ENCOUNTER — Other Ambulatory Visit (HOSPITAL_COMMUNITY)
Admission: RE | Admit: 2019-02-01 | Discharge: 2019-02-01 | Disposition: A | Discharge status: Home / Self Care | Payer: Medicare Other | Source: Ambulatory Visit | Attending: Vascular Surgery | Admitting: Vascular Surgery

## 2019-02-01 DIAGNOSIS — Z20828 Contact with and (suspected) exposure to other viral communicable diseases: Secondary | ICD-10-CM | POA: Insufficient documentation

## 2019-02-01 DIAGNOSIS — Z01812 Encounter for preprocedural laboratory examination: Secondary | ICD-10-CM | POA: Diagnosis present

## 2019-02-02 LAB — NOVEL CORONAVIRUS, NAA (HOSP ORDER, SEND-OUT TO REF LAB; TAT 18-24 HRS): SARS-CoV-2, NAA: NOT DETECTED

## 2019-02-05 ENCOUNTER — Encounter (HOSPITAL_COMMUNITY): Payer: Self-pay | Admitting: Vascular Surgery

## 2019-02-05 ENCOUNTER — Ambulatory Visit (HOSPITAL_COMMUNITY)
Admission: RE | Disposition: A | Discharge status: Home / Self Care | Payer: Self-pay | Source: Home / Self Care | Attending: Vascular Surgery

## 2019-02-05 ENCOUNTER — Ambulatory Visit (HOSPITAL_COMMUNITY)
Admission: RE | Admit: 2019-02-05 | Discharge: 2019-02-05 | Disposition: A | Discharge status: Home / Self Care | Payer: Medicare Other | Attending: Vascular Surgery | Admitting: Vascular Surgery

## 2019-02-05 ENCOUNTER — Other Ambulatory Visit: Payer: Self-pay

## 2019-02-05 DIAGNOSIS — K219 Gastro-esophageal reflux disease without esophagitis: Secondary | ICD-10-CM | POA: Insufficient documentation

## 2019-02-05 DIAGNOSIS — Z79899 Other long term (current) drug therapy: Secondary | ICD-10-CM | POA: Insufficient documentation

## 2019-02-05 DIAGNOSIS — Z882 Allergy status to sulfonamides status: Secondary | ICD-10-CM | POA: Diagnosis not present

## 2019-02-05 DIAGNOSIS — Z85820 Personal history of malignant melanoma of skin: Secondary | ICD-10-CM | POA: Insufficient documentation

## 2019-02-05 DIAGNOSIS — I10 Essential (primary) hypertension: Secondary | ICD-10-CM | POA: Diagnosis not present

## 2019-02-05 DIAGNOSIS — F419 Anxiety disorder, unspecified: Secondary | ICD-10-CM | POA: Insufficient documentation

## 2019-02-05 DIAGNOSIS — I701 Atherosclerosis of renal artery: Secondary | ICD-10-CM

## 2019-02-05 DIAGNOSIS — Z791 Long term (current) use of non-steroidal anti-inflammatories (NSAID): Secondary | ICD-10-CM | POA: Diagnosis not present

## 2019-02-05 HISTORY — PX: RENAL ANGIOGRAPHY: CATH118260

## 2019-02-05 LAB — POCT I-STAT, CHEM 8
BUN: 25 mg/dL — ABNORMAL HIGH (ref 8–23)
Calcium, Ion: 1.2 mmol/L (ref 1.15–1.40)
Chloride: 106 mmol/L (ref 98–111)
Creatinine, Ser: 0.8 mg/dL (ref 0.44–1.00)
Glucose, Bld: 96 mg/dL (ref 70–99)
HCT: 38 % (ref 36.0–46.0)
Hemoglobin: 12.9 g/dL (ref 12.0–15.0)
Potassium: 4.1 mmol/L (ref 3.5–5.1)
Sodium: 142 mmol/L (ref 135–145)
TCO2: 27 mmol/L (ref 22–32)

## 2019-02-05 SURGERY — RENAL ANGIOGRAPHY
Anesthesia: LOCAL | Laterality: Right

## 2019-02-05 MED ORDER — SODIUM CHLORIDE 0.9 % IV SOLN
INTRAVENOUS | Status: DC
  Administered 2019-02-05: 07:00:00 via INTRAVENOUS

## 2019-02-05 MED ORDER — SODIUM CHLORIDE 0.9% FLUSH: 3.0000 mL | INTRAVENOUS | Status: DC | PRN

## 2019-02-05 MED ORDER — SODIUM CHLORIDE 0.9% FLUSH: 3.0000 mL | Freq: Two times a day (BID) | INTRAVENOUS | Status: DC

## 2019-02-05 MED ORDER — MIDAZOLAM HCL 2 MG/2ML IJ SOLN
INTRAMUSCULAR | Status: DC | PRN
  Administered 2019-02-05: 1 mg via INTRAVENOUS

## 2019-02-05 MED ORDER — SODIUM CHLORIDE 0.9 % IV SOLN: 250.0000 mL | INTRAVENOUS | Status: DC | PRN

## 2019-02-05 MED ORDER — LABETALOL HCL 5 MG/ML IV SOLN: 10.0000 mg | INTRAVENOUS | Status: DC | PRN

## 2019-02-05 MED ORDER — HYDRALAZINE HCL 20 MG/ML IJ SOLN: 5.0000 mg | INTRAMUSCULAR | Status: DC | PRN

## 2019-02-05 MED ORDER — ACETAMINOPHEN 325 MG PO TABS: 650.0000 mg | ORAL_TABLET | ORAL | Status: DC | PRN

## 2019-02-05 MED ORDER — HEPARIN SODIUM (PORCINE) 1000 UNIT/ML IJ SOLN
INTRAMUSCULAR | Status: DC | PRN
  Administered 2019-02-05: 5000 [IU] via INTRAVENOUS

## 2019-02-05 MED ORDER — SODIUM CHLORIDE 0.9 % WEIGHT BASED INFUSION: 1.0000 mL/kg/h | INTRAVENOUS | Status: DC

## 2019-02-05 MED ORDER — HYDRALAZINE HCL 20 MG/ML IJ SOLN
INTRAMUSCULAR | Status: DC | PRN
  Administered 2019-02-05: 10 mg via INTRAVENOUS

## 2019-02-05 MED ORDER — FENTANYL CITRATE (PF) 100 MCG/2ML IJ SOLN
INTRAMUSCULAR | Status: DC | PRN
  Administered 2019-02-05: 50 ug via INTRAVENOUS

## 2019-02-05 MED ORDER — HEPARIN (PORCINE) IN NACL 1000-0.9 UT/500ML-% IV SOLN
INTRAVENOUS | Status: AC
  Filled 2019-02-05: qty 1000

## 2019-02-05 MED ORDER — ONDANSETRON HCL 4 MG/2ML IJ SOLN: 4.0000 mg | Freq: Four times a day (QID) | INTRAMUSCULAR | Status: DC | PRN

## 2019-02-05 MED ORDER — MIDAZOLAM HCL 2 MG/2ML IJ SOLN
INTRAMUSCULAR | Status: AC
  Filled 2019-02-05: qty 2

## 2019-02-05 MED ORDER — IODIXANOL 320 MG/ML IV SOLN
INTRAVENOUS | Status: DC | PRN
  Administered 2019-02-05: 10:00:00 120 mL

## 2019-02-05 MED ORDER — FENTANYL CITRATE (PF) 100 MCG/2ML IJ SOLN
INTRAMUSCULAR | Status: AC
  Filled 2019-02-05: qty 2

## 2019-02-05 MED ORDER — HEPARIN (PORCINE) IN NACL 1000-0.9 UT/500ML-% IV SOLN
INTRAVENOUS | Status: DC | PRN
  Administered 2019-02-05 (×2): 500 mL

## 2019-02-05 MED ORDER — LIDOCAINE HCL (PF) 1 % IJ SOLN
INTRAMUSCULAR | Status: AC
  Filled 2019-02-05: qty 30

## 2019-02-05 SURGICAL SUPPLY — 16 items
CATH CROSS OVER TEMPO 5F (CATHETERS) ×2 IMPLANT
CATH OMNI FLUSH 5F 65CM (CATHETERS) ×2 IMPLANT
CLOSURE MYNX CONTROL 5F (Vascular Products) ×2 IMPLANT
COVER DOME SNAP 22 D (MISCELLANEOUS) ×2 IMPLANT
GUIDE CATH VISTA JR4 6F (CATHETERS) ×2 IMPLANT
KIT ESSENTIALS PG (KITS) ×2 IMPLANT
KIT MICROPUNCTURE NIT STIFF (SHEATH) ×2 IMPLANT
KIT PV (KITS) ×2 IMPLANT
SHEATH PINNACLE 6F 10CM (SHEATH) ×2 IMPLANT
SHEATH PROBE COVER 6X72 (BAG) ×2 IMPLANT
STOPCOCK MORSE 400PSI 3WAY (MISCELLANEOUS) ×2 IMPLANT
SYR MEDRAD MARK V 150ML (SYRINGE) ×2 IMPLANT
TRANSDUCER W/STOPCOCK (MISCELLANEOUS) ×2 IMPLANT
TRAY PV CATH (CUSTOM PROCEDURE TRAY) ×2 IMPLANT
WIRE BENTSON .035X145CM (WIRE) ×2 IMPLANT
WIRE STABILIZER XS .014X180CM (WIRE) ×2 IMPLANT

## 2019-02-05 NOTE — Progress Notes (Signed)
Up and walked and tolerated well; right groin stable, no bleeding or hematoma 

## 2019-02-05 NOTE — H&P (Signed)
History and Physical Interval Note:  02/05/2019 8:32 AM  Carla Ramirez  has presented today for surgery, with the diagnosis of Renal Artery Stenosis.  The various methods of treatment have been discussed with the patient and family. After consideration of risks, benefits and other options for treatment, the patient has consented to  Procedure(s): RENAL ANGIOGRAPHY (Right) as a surgical intervention.  The patient's history has been reviewed, patient examined, no change in status, stable for surgery.  I have reviewed the patient's chart and labs.  Questions were answered to the patient's satisfaction.    Renal angiography and possible intervention.  Carla Ramirez  [facility] Specialist of [facility]  Patient name: Carla Ramirez   MRN: [id number]        DOB: [birth year]          Sex: female  REASON FOR CONSULT: Discuss diagnosis of right renal artery stenosis  HPI: Carla Ramirez is a 68 y.o. female, who is here today for discussion of recent duplex showing right renal artery stenosis.  She has had a difficult time with hypertension despite addition of medications for hypertension.  She underwent renal duplex to rule out renovascular hypertension as the cause of this and I have this for review.  This did reveal stenosis in her right renal artery with diminished renal size.  Duplex was from 01/08/2019.  She has no history of cardiac disease.  He has no history of peripheral vascular occlusive disease.      Past Medical History:  Diagnosis Date  . Acid reflux   . Anemia   . Anxiety   . Diverticulosis   . Hiatal hernia   . Hypertension    off medication since 9/13  . Hypertension   . Iron deficiency anemia 03/19/2014  . 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 Melanoma ([place]) 2018   On back.  Dr. Ubaldo Glassing follows.  . Memory change 08/16/2015         Family History  Problem Relation Age of Onset  . Stroke Mother   . Dementia Mother   . Cancer Brother        lymphoma     SOCIAL HISTORY: Social History   Socioeconomic History  . Marital status: Married    Spouse name: Not on file  . Number of children: 3  . Years of education: Masters  . Highest education level: Not on file  Occupational History  . Not on file  Social Needs  . Financial resource strain: Not on file  . Food insecurity    Worry: Not on file    Inability: Not on file  . Transportation needs    Medical: Not on file    Non-medical: Not on file  Tobacco Use  . Smoking status: Never Smoker  . Smokeless tobacco: Never Used  Substance and Sexual Activity  . Alcohol use: Yes    Alcohol/week: 1.0 standard drinks    Types: 1 Standard drinks or equivalent per week  . Drug use: No  . Sexual activity: Yes    Partners: Male    Birth control/protection: Post-menopausal  Lifestyle  . Physical activity    Days per week: Not on file    Minutes per session: Not on file  . Stress: Not on file  Relationships  . Social Herbalist on phone: Not on file    Gets together: Not on file    Attends religious service: Not on file    Active member of club  or organization: Not on file    Attends meetings of clubs or organizations: Not on file    Relationship status: Not on file  . Intimate partner violence    Fear of current or ex partner: Not on file    Emotionally abused: Not on file    Physically abused: Not on file    Forced sexual activity: Not on file  Other Topics Concern  . Not on file  Social History Narrative   Married lives with hsuband at home.  Retired.  Has Masters degree.  Has 3 children.    Right-handed   Caffeine:         Allergies  Allergen Reactions  . Sulfa Antibiotics Swelling    Facial swelling          Current Outpatient Medications  Medication Sig Dispense Refill  . ALPRAZolam (XANAX) 0.25 MG tablet 0.25 mg as needed.    Marland Kitchen atenolol (TENORMIN) 100 MG tablet Take 1 tablet by mouth daily.  7  .  Calcium Carbonate-Vit D-Min (CALCIUM 1200 PO) Take by mouth daily.    . cholecalciferol (VITAMIN D) 1000 units tablet Take 2,000 Units by mouth daily.     . hydrocortisone (ANUSOL-HC) 2.5 % rectal cream Place rectally 2 (two) times daily. Use for up to 7 days. 30 g 1  . losartan (COZAAR) 50 MG tablet Take 1 tablet by mouth daily.    . Meloxicam 15 MG TBDP     . pantoprazole (PROTONIX) 20 MG tablet TK 1 T PO QD     No current facility-administered medications for this visit.     REVIEW OF SYSTEMS:  [X]  denotes positive finding, [ ]  denotes negative finding Cardiac  Comments:  Chest pain or chest pressure: x   Shortness of breath upon exertion:    Short of breath when lying flat:    Irregular heart rhythm:        Vascular    Pain in calf, thigh, or hip brought on by ambulation:    Pain in feet at night that wakes you up from your sleep:     Blood clot in your veins:    Leg swelling:         Pulmonary    Oxygen at home:    Productive cough:     Wheezing:         Neurologic    Sudden weakness in arms or legs:     Sudden numbness in arms or legs:     Sudden onset of difficulty speaking or slurred speech:    Temporary loss of vision in one eye:     Problems with dizziness:         Gastrointestinal    Blood in stool:     Vomited blood:         Genitourinary    Burning when urinating:     Blood in urine:        Psychiatric    Major depression:         Hematologic    Bleeding problems:    Problems with blood clotting too easily:        Skin    Rashes or ulcers:        Constitutional    Fever or chills:      PHYSICAL EXAM:    Vitals:   01/28/19 1103  BP: (!) 153/82  Pulse: (!) 52  Resp: 18  Temp: (!) 97.2 F (36.2 C)  SpO2: 100%  Weight:  60.3 kg  Height: 5' 2.75" (1.594 m)    GENERAL: The patient is a well-nourished female, in no acute distress.  The vital signs are documented above. CARDIOVASCULAR: 2+ radial and 2+ dorsalis pedis pulses bilaterally.  Carotid arteries without bruits bilaterally.  No abdominal bruit noted. PULMONARY: There is good air exchange  ABDOMEN: Soft and non-tender  MUSCULOSKELETAL: There are no major deformities or cyanosis. NEUROLOGIC: No focal weakness or paresthesias are detected. SKIN: There are no ulcers or rashes noted. PSYCHIATRIC: The patient has a normal affect.  DATA:  Renal artery duplex suggested right renal artery stenosis on 01/08/2019.  Also significantly diminished volume of right kidney versus left.  The right kidney is 10.7 cm in total length with a volume estimated at 145 mL.  Left kidney total length is 11.4 with estimated volume of 240 mL  MEDICAL ISSUES: Had a very long discussion with the patient regarding the significance of this.  I explained that with the diminished right renal size and difficult to control hypertension that I would recommend arteriography for further evaluation and right renal artery angioplasty if she indeed had a flow-limiting stenosis.  I did explain the potential complications to include access issues.  I did also explained that she may simply have essential hypertension and that the renal artery narrowing is not contributing to her high blood pressure.  With her diminished renal size, I would recommend angioplasty if she does have flow-limiting stenosis.  She understands and wished to proceed.  She was introduced today to Dr. Fortunato Curling who will be doing the procedure   Rosetta Posner, MD [facility] of [facility] Tel [phone] Pager [phone]

## 2019-02-05 NOTE — Op Note (Signed)
    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  Ultrasound-guided access of the right common femoral artery 2.  Aortogram with renal artery arteriogram including first-order cannulation of the right renal artery 3.  51 minutes of monitored moderate conscious sedation time 4.  Mynx closure of the right common femoral artery  Indications: Patient is a 68 year old female who was seen in consultation by Dr. Donnetta Hutching with concern for high-grade right renal artery stenosis after renal artery ultrasound in the setting of uncontrolled hypertension despite addition of medications.  She presents today for renal artery arteriogram and possible intervention after risks and benefits were discussed.  Findings:   Aortogram showed single renal arteries bilaterally.  There was no evidence of aortoiliac flow-limiting stenosis.  The left renal artery was widely patent.  Initially had some difficulty seeing the ostium of the right renal artery given parallax issues and posterior displacement of the artery.  Ultimately the right renal artery was cannulated with a JR4 guide cath and a 014 guidewire.  A right renal artery arteriogram showed widely patent right renal artery with no evidence of ostial mid or distal stenosis in the vessel.   Procedure:  The patient was identified in the holding area and taken to room 8.  The patient was then placed supine on the table and prepped and draped in the usual sterile fashion.  A time out was called.  Ultrasound was used to evaluate the right common femoral artery.  It was patent .  A digital ultrasound image was acquired.  A micropuncture needle was used to access the right common femoral artery under ultrasound guidance.  An 018 wire was advanced without resistance and a  micropuncture sheath was placed.  The 018 wire was removed and a benson wire was placed.  The micropuncture sheath was exchanged for a 5 french sheath.  An omniflush catheter was advanced over the wire to the level of L-1.  An abdominal angiogram was obtained with renal artery arteriogram.  As noted above there was no evidence of aortoiliac stenosis.  There was single renal arteries bilaterally.  The left renal artery was easily visualized and was widely patent.  We took multiple aortograms at different orientations of the C arm to get better visualization of the ostium of the right renal artery given that there was concern for a stenosis here.  Ultimately ended up using a JR4 guide cath with a 014 wire to cannulate the right renal artery.  We were finally able to adjust the parallax and a after primary selection of the right renal artery and injection through the catheter showed no evidence of ostial stenosis.  There was no role for intervention.  At that point in time wires and catheters were removed.  A mynx closure was deployed in the right common femoral artery.    Marty Heck, MD [facility] Office: [phone] Pager: [place]

## 2019-02-05 NOTE — Progress Notes (Signed)
Dr Carlis Abbott notified of B/P [id number] and [id number] systolic; no new orders

## 2019-02-05 NOTE — Discharge Instructions (Signed)
Femoral Site Care °This sheet gives you information about how to care for yourself after your procedure. Your health care provider may also give you more specific instructions. If you have problems or questions, contact your health care provider. °What can I expect after the procedure? °After the procedure, it is common to have: °· Bruising that usually fades within 1-2 weeks. °· Tenderness at the site. °Follow these instructions at home: °Wound care °· Follow instructions from your health care provider about how to take care of your insertion site. Make sure you: °? Wash your hands with soap and water before you change your bandage (dressing). If soap and water are not available, use hand sanitizer. °? Change your dressing as told by your health care provider. °? Leave stitches (sutures), skin glue, or adhesive strips in place. These skin closures may need to stay in place for 2 weeks or longer. If adhesive strip edges start to loosen and curl up, you may trim the loose edges. Do not remove adhesive strips completely unless your health care provider tells you to do that. °· Do not take baths, swim, or use a hot tub until your health care provider approves. °· You may shower 24-48 hours after the procedure or as told by your health care provider. °? Gently wash the site with plain soap and water. °? Pat the area dry with a clean towel. °? Do not rub the site. This may cause bleeding. °· Do not apply powder or lotion to the site. Keep the site clean and dry. °· Check your femoral site every day for signs of infection. Check for: °? Redness, swelling, or pain. °? Fluid or blood. °? Warmth. °? Pus or a bad smell. °Activity °· For the first 2-3 days after your procedure, or as long as directed: °? Avoid climbing stairs as much as possible. °? Do not squat. °· Do not lift anything that is heavier than 10 lb (4.5 kg), or the limit that you are told, until your health care provider says that it is safe. °· Rest as  directed. °? Avoid sitting for a long time without moving. Get up to take short walks every 1-2 hours. °· Do not drive for 24 hours if you were given a medicine to help you relax (sedative). °General instructions °· Take over-the-counter and prescription medicines only as told by your health care provider. °· Keep all follow-up visits as told by your health care provider. This is important. °Contact a health care provider if you have: °· A fever or chills. °· You have redness, swelling, or pain around your insertion site. °Get help right away if: °· The catheter insertion area swells very fast. °· You pass out. °· You suddenly start to sweat or your skin gets clammy. °· The catheter insertion area is bleeding, and the bleeding does not stop when you hold steady pressure on the area. °· The area near or just beyond the catheter insertion site becomes pale, cool, tingly, or numb. °These symptoms may represent a serious problem that is an emergency. Do not wait to see if the symptoms will go away. Get medical help right away. Call your local emergency services (911 in the [place] Do not drive yourself to the hospital. °Summary °· After the procedure, it is common to have bruising that usually fades within 1-2 weeks. °· Check your femoral site every day for signs of infection. °· Do not lift anything that is heavier than 10 lb (4.5 kg), or the   limit that you are told, until your health care provider says that it is safe. °This information is not intended to replace advice given to you by your health care provider. Make sure you discuss any questions you have with your health care provider. °Document Released: 01/16/2014 Document Revised: 05/28/2017 Document Reviewed: 05/28/2017 °Elsevier Patient Education © 2020 [employer] ° ° ° °Moderate Conscious Sedation, Adult, Care After °These instructions provide you with information about caring for yourself after your procedure. Your health care provider may also give you  more specific instructions. Your treatment has been planned according to current medical practices, but problems sometimes occur. Call your health care provider if you have any problems or questions after your procedure. °What can I expect after the procedure? °After your procedure, it is common: °· To feel sleepy for several hours. °· To feel clumsy and have poor balance for several hours. °· To have poor judgment for several hours. °· To vomit if you eat too soon. °Follow these instructions at home: °For at least 24 hours after the procedure: ° °· Do not: °? Participate in activities where you could fall or become injured. °? Drive. °? Use heavy machinery. °? Drink alcohol. °? Take sleeping pills or medicines that cause drowsiness. °? Make important decisions or sign legal documents. °? Take care of children on your own. °· Rest. °Eating and drinking °· Follow the diet recommended by your health care provider. °· If you vomit: °? Drink water, juice, or soup when you can drink without vomiting. °? Make sure you have little or no nausea before eating solid foods. °General instructions °· Have a responsible adult stay with you until you are awake and alert. °· Take over-the-counter and prescription medicines only as told by your health care provider. °· If you smoke, do not smoke without supervision. °· Keep all follow-up visits as told by your health care provider. This is important. °Contact a health care provider if: °· You keep feeling nauseous or you keep vomiting. °· You feel light-headed. °· You develop a rash. °· You have a fever. °Get help right away if: °· You have trouble breathing. °This information is not intended to replace advice given to you by your health care provider. Make sure you discuss any questions you have with your health care provider. °Document Released: 03/05/2013 Document Revised: 04/27/2017 Document Reviewed: 09/04/2015 °Elsevier Patient Education © 2020 [employer] ° °

## 2019-02-06 MED FILL — Lidocaine HCl Local Preservative Free (PF) Inj 1%: INTRAMUSCULAR | Qty: 30 | Status: AC

## 2019-02-10 ENCOUNTER — Telehealth: Payer: Self-pay

## 2019-02-10 NOTE — Telephone Encounter (Signed)
Pt called and said that she noticed a small knot at her incision area and she has a lot of bruising. Wanted to know if this was normal.   Called pt - she has no fever, area not warm to the touch and no other concerns. Advised her after speaking with Zigmund Daniel that this was normal. No follow up made for her at this point but advised her we would speak with her surgeon when he is back in the office to see if she needed to come in or only as an as needed basis.  She did state that her BP was doing much better now.   York Cerise, CMA

## 2019-03-07 ENCOUNTER — Other Ambulatory Visit: Payer: Self-pay | Admitting: Internal Medicine

## 2019-03-07 DIAGNOSIS — Z20822 Contact with and (suspected) exposure to covid-19: Secondary | ICD-10-CM

## 2019-03-08 LAB — NOVEL CORONAVIRUS, NAA: SARS-CoV-2, NAA: NOT DETECTED

## 2019-03-26 ENCOUNTER — Other Ambulatory Visit: Payer: Self-pay

## 2019-03-28 ENCOUNTER — Other Ambulatory Visit: Payer: Self-pay

## 2019-03-28 ENCOUNTER — Ambulatory Visit (INDEPENDENT_AMBULATORY_CARE_PROVIDER_SITE_OTHER): Payer: Medicare Other | Admitting: Obstetrics & Gynecology

## 2019-03-28 ENCOUNTER — Encounter: Payer: Self-pay | Admitting: Obstetrics & Gynecology

## 2019-03-28 VITALS — BP 168/90 | HR 60 | Temp 98.0°F | Resp 12 | Ht 61.75 in | Wt 135.4 lb

## 2019-03-28 DIAGNOSIS — Z01419 Encounter for gynecological examination (general) (routine) without abnormal findings: Secondary | ICD-10-CM | POA: Diagnosis not present

## 2019-03-28 DIAGNOSIS — I701 Atherosclerosis of renal artery: Secondary | ICD-10-CM | POA: Insufficient documentation

## 2019-03-28 NOTE — Progress Notes (Signed)
68 y.o. G91P2003 Married White or Caucasian female here for annual exam.  Doing well.  She is aware her BP is quite elevated today.  Reports she just recently changed dosage to 1/2 tablet of 100mg  atenolol in AM and in PM and Losartan.  Had renal artery duplex done 01/08/2019 showing right renal artery stenosis.  Renal artery angiogram was 02/15/2019.    Has also seen neurology for follow up this for concerns about memory.  Testing is stable.  No vaginal bleeding.    Patient's last menstrual period was 05/30/2003.          Sexually active: Yes.    The current method of family planning is post menopausal status.    Exercising: Yes.    walking, cardio, and strength training Smoker:  no  Health Maintenance: Pap:   12/10/17 Neg. HR HPV Neg.  08/05/15 Neg              02/05/14 Neg  History of abnormal Pap:  yes MMG:  07/17/18 BIRADS 1 negative/density b Colonoscopy:  03/03/13 f/u 5 years.  Had this scheduled in May and this was cancelled due to Covid.   BMD:   07/12/16 Osteopenia TDaP:  2011 Pneumonia vaccine(s):  Pneumovax - 04/16/18 Shingrix:   Completed Hep C testing: Donated blood Screening Labs: PCP   reports that she has never smoked. She has never used smokeless tobacco. She reports current alcohol use of about 1.0 standard drinks of alcohol per week. She reports that she does not use drugs.  Past Medical History:  Diagnosis Date  . Acid reflux   . Anemia   . Anxiety   . Diverticulosis   . Hiatal hernia   . Hypertension    off medication since 9/13  . Hypertension   . Iron deficiency anemia 03/19/2014  . 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 Melanoma ([place]) 2018   On back.  Dr. Ubaldo Glassing follows.  . Memory change 08/16/2015    Past Surgical History:  Procedure Laterality Date  . COLONOSCOPY WITH ESOPHAGOGASTRODUODENOSCOPY (EGD)    . RENAL ANGIOGRAPHY Right 02/05/2019   Procedure: RENAL ANGIOGRAPHY;  Surgeon: Marty Heck, MD;  Location: [facility];  Service: Cardiovascular;   Laterality: Right;    Current Outpatient Medications  Medication Sig Dispense Refill  . ALPRAZolam (XANAX) 0.25 MG tablet Take 0.25 mg by mouth daily as needed for anxiety.     Marland Kitchen atenolol (TENORMIN) 100 MG tablet Take 100 mg by mouth daily.   7  . Calcium Carb-Cholecalciferol (CALCIUM 500 +D) 500-400 MG-UNIT TABS Take 1 tablet by mouth 2 (two) times daily.    . cholecalciferol (VITAMIN D) 1000 units tablet Take 1,000 Units by mouth 2 (two) times daily.     . hydrocortisone (ANUSOL-HC) 2.5 % rectal cream Place rectally 2 (two) times daily. Use for up to 7 days. (Patient taking differently: Place 1 application rectally 2 (two) times daily as needed for hemorrhoids. Use for up to 7 days.) 30 g 1  . losartan (COZAAR) 50 MG tablet Take 50 mg by mouth every evening.     . Meloxicam 15 MG TBDP Take 15 mg by mouth daily as needed (pain.).     Marland Kitchen pantoprazole (PROTONIX) 20 MG tablet Take 20 mg by mouth daily before breakfast.     . tetrahydrozoline 0.05 % ophthalmic solution Place 1-2 drops into both eyes 3 (three) times daily as needed (dry/irritated eyes.).     No current facility-administered medications for this visit.  Family History  Problem Relation Age of Onset  . Stroke Mother   . Dementia Mother   . Cancer Brother        lymphoma    Review of Systems  Constitutional: Negative.   HENT: Negative.   Eyes: Negative.   Respiratory: Negative.   Cardiovascular: Negative.   Gastrointestinal: Negative.   Endocrine: Negative.   Genitourinary: Negative.   Musculoskeletal: Negative.   Skin: Negative.   Allergic/Immunologic: Negative.   Neurological: Negative.   Hematological: Negative.   Psychiatric/Behavioral: Negative.     Exam:   BP (!) 192/80 (BP Location: Left Arm, Patient Position: Sitting, Cuff Size: Normal)   Pulse 60   Temp 98 F (36.7 C) (Temporal)   Resp 12   Ht 5' 1.75" (1.568 m)   Wt 135 lb 6.4 oz (61.4 kg)   LMP 05/30/2003   BMI 24.97 kg/m     Height: 5'  1.75" (156.8 cm)  Ht Readings from Last 3 Encounters:  03/28/19 5' 1.75" (1.568 m)  02/05/19 5' 2.75" (1.594 m)  01/28/19 5' 2.75" (1.594 m)    General appearance: alert, cooperative and appears stated age Head: Normocephalic, without obvious abnormality, atraumatic Neck: no adenopathy, supple, symmetrical, trachea midline and thyroid normal to inspection and palpation Lungs: clear to auscultation bilaterally Breasts: normal appearance, no masses or tenderness Heart: regular rate and rhythm Abdomen: soft, non-tender; bowel sounds normal; no masses,  no organomegaly Extremities: extremities normal, atraumatic, no cyanosis or edema Skin: Skin color, texture, turgor normal. No rashes or lesions Lymph nodes: Cervical, supraclavicular, and axillary nodes normal. No abnormal inguinal nodes palpated Neurologic: Grossly normal   Pelvic: External genitalia:  no lesions              Urethra:  normal appearing urethra with no masses, tenderness or lesions              Bartholins and Skenes: normal                 Vagina: normal appearing vagina with normal color and discharge, no lesions              Cervix: no lesions              Pap taken: No. Bimanual Exam:  Uterus:  normal size, contour, position, consistency, mobility, non-tender              Adnexa: normal adnexa and no mass, fullness, tenderness               Rectovaginal: Confirms               Anus:  normal sphincter tone, no lesions  Chaperone was present for exam.  A:  Well Woman with normal exam PMP, no HRT Mild memory changes followed by Dr. Jannifer Franklin H/O microscopic hematuria Melanoma 2018.  Is seen every six months.  Hypertension.  She is going to call Dr. Stephanie Acre.  P:   Mammogram guidelines reviewed. Aware colonoscopy is due.  Was scheduled earlier this year. pap smear neg with neg HR HPV 2019.  Not indicated today. BMD UTD Vaccines UTD Lab work done with Dr. Stephanie Acre Return annually or prn

## 2019-06-18 ENCOUNTER — Ambulatory Visit: Payer: Medicare PPO | Attending: Internal Medicine

## 2019-06-18 DIAGNOSIS — Z23 Encounter for immunization: Secondary | ICD-10-CM | POA: Insufficient documentation

## 2019-06-18 NOTE — Progress Notes (Signed)
   Covid-19 Vaccination Clinic  Name:  Carla Ramirez    MRN: [id number] DOB: 30-Aug-1950  06/18/2019  [name] was observed post Covid-19 immunization for 15 minutes without incidence. She was provided with Vaccine Information Sheet and instruction to access the V-Safe system.   [name] was instructed to call 911 with any severe reactions post vaccine: Marland Kitchen Difficulty breathing  . Swelling of your face and throat  . A fast heartbeat  . A bad rash all over your body  . Dizziness and weakness    Immunizations Administered    Name Date Dose VIS Date Route   Pfizer COVID-19 Vaccine 06/18/2019  6:54 PM 0.3 mL 05/09/2019 Intramuscular   Manufacturer: [place]   Lot: [id number]   [place]: [id number]

## 2019-06-25 NOTE — Progress Notes (Signed)
PATIENT: Carla Ramirez DOB: 07-27-1950  REASON FOR VISIT: follow up HISTORY FROM: patient  HISTORY OF PRESENT ILLNESS: Today 06/26/19  Carla Ramirez is a 69 year old female with history of mild memory disturbance.  She lives with her husband, performs her own ADLs.  She drives a car without difficulty.  Her trouble with memory is exacerbated by anxiety.  She is not on any memory medication.  She feels her memory is stable, really only has difficulty during times of stress or anxiety.  Due to the pandemic, she really has not had much stress or anxiety lately, therefore her memory has been well.  She does report at times she has difficulty remembering people's names, or may lose her train of thought, but she thinks this may just be normal with age.  She does report that her mother had dementia.  She has Xanax she may take as needed for anxiety.  Overall, she has been doing well, no new problems or concerns.  She does take medication for blood pressure.  She has been doing a lot of reading, and walks regularly for exercise.  She presents today for follow-up unaccompanied.  HISTORY 12/18/2018 SS: Carla Ramirez is a 69 year old female with history of mild memory disturbance.  Her memory score has been stable at 30/30. Today her score is 28/30. With COVID, she is not used to not being active, not able to volunteer. Her memory tends to worsen when she is stressed. 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  She reports at times she feels that she has trouble remembering words.  She says her memory troubles are exacerbated during times of stress.  She does take Xanax as needed.  She lives with her husband, performs all of her own ADLs.  She does drive a car without difficulty.  She manages her medications and her appointments independently.  She is very active in high functioning.  She has been started on a new blood pressure medication, losartan in the last 2 weeks.  She presents today  for follow-up.  REVIEW OF SYSTEMS: Out of a complete 14 system review of symptoms, the patient complains only of the following symptoms, and all other reviewed systems are negative.  Memory loss  ALLERGIES: Allergies  Allergen Reactions  . Sulfa Antibiotics Swelling    Facial swelling    HOME MEDICATIONS: Outpatient Medications Prior to Visit  Medication Sig Dispense Refill  . ALPRAZolam (XANAX) 0.25 MG tablet Take 0.25 mg by mouth daily as needed for anxiety.     Marland Kitchen amLODIPine-Valsartan-HCTZ 10-320-25 MG TABS Take by mouth. Prescribed by Dr.Wolters    . atenolol (TENORMIN) 100 MG tablet Take 100 mg by mouth daily.   7  . Calcium Carb-Cholecalciferol (CALCIUM 500 +D) 500-400 MG-UNIT TABS Take 1 tablet by mouth 2 (two) times daily.    . cholecalciferol (VITAMIN D) 1000 units tablet Take 1,000 Units by mouth 2 (two) times daily.     . hydrocortisone (ANUSOL-HC) 2.5 % rectal cream Place rectally 2 (two) times daily. Use for up to 7 days. (Patient taking differently: Place 1 application rectally 2 (two) times daily as needed for hemorrhoids. Use for up to 7 days.) 30 g 1  . Meloxicam 15 MG TBDP Take 15 mg by mouth daily as needed (pain.).     Marland Kitchen pantoprazole (PROTONIX) 20 MG tablet Take 20 mg by mouth daily before breakfast.     . tetrahydrozoline 0.05 % ophthalmic solution Place  1-2 drops into both eyes 3 (three) times daily as needed (dry/irritated eyes.).    Marland Kitchen losartan (COZAAR) 50 MG tablet Take 50 mg by mouth every evening.      No facility-administered medications prior to visit.    PAST MEDICAL HISTORY: Past Medical History:  Diagnosis Date  . Acid reflux   . Anxiety   . Diverticulosis   . Hiatal hernia   . Hypertension    off medication since 9/13  . Hypertension   . Iron deficiency anemia 03/19/2014  . 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 Melanoma ([place]) 2018   On back.  Dr. Ubaldo Glassing follows.  . Memory change 08/16/2015    PAST SURGICAL HISTORY: Past Surgical History:  Procedure  Laterality Date  . COLONOSCOPY WITH ESOPHAGOGASTRODUODENOSCOPY (EGD)    . RENAL ANGIOGRAPHY Right 02/05/2019   Procedure: RENAL ANGIOGRAPHY;  Surgeon: Marty Heck, MD;  Location: [facility];  Service: Cardiovascular;  Laterality: Right;    FAMILY HISTORY: Family History  Problem Relation Age of Onset  . Stroke Mother   . Dementia Mother   . Lymphoma Brother             SOCIAL HISTORY: Social History   Socioeconomic History  . Marital status: Married    Spouse name: Not on file  . Number of children: 3  . Years of education: Masters  . Highest education level: Not on file  Occupational History  . Not on file  Tobacco Use  . Smoking status: Never Smoker  . Smokeless tobacco: Never Used  Substance and Sexual Activity  . Alcohol use: Yes    Alcohol/week: 1.0 standard drinks    Types: 1 Standard drinks or equivalent per week  . Drug use: No  . Sexual activity: Yes    Partners: Male    Birth control/protection: Post-menopausal  Other Topics Concern  . Not on file  Social History Narrative   Married lives with hsuband at home.  Retired.  Has Masters degree.  Has 3 children.    Right-handed   Caffeine:   Social Determinants of Health   Financial Resource Strain:   . Difficulty of Paying Living Expenses: Not on file  Food Insecurity:   . Worried About Charity fundraiser in the Last Year: Not on file  . Ran Out of Food in the Last Year: Not on file  Transportation Needs:   . Lack of Transportation (Medical): Not on file  . Lack of Transportation (Non-Medical): Not on file  Physical Activity:   . Days of Exercise per Week: Not on file  . Minutes of Exercise per Session: Not on file  Stress:   . Feeling of Stress : Not on file  Social Connections:   . Frequency of Communication with Friends and Family: Not on file  . Frequency of Social Gatherings with Friends and Family: Not on file  . Attends Religious Services: Not on file  . Active Member of  Clubs or Organizations: Not on file  . Attends Archivist Meetings: Not on file  . Marital Status: Not on file  Intimate Partner Violence:   . Fear of Current or Ex-Partner: Not on file  . Emotionally Abused: Not on file  . Physically Abused: Not on file  . Sexually Abused: Not on file   PHYSICAL EXAM  Vitals:   06/26/19 1108  BP: (!) 167/79  Pulse: (!) 55  Temp: (!) 96.2 F (35.7 C)  Weight: 138 lb 12.8 oz (63 kg)  Height: 5' 2.75" (1.594 m)   Body mass index is 24.78 kg/m.  Generalized: Well developed, in no acute distress  MMSE - Mini Mental State Exam 06/26/2019 12/18/2018 12/12/2017  Orientation to time 5 5 5   Orientation to Place 5 5 5   Registration 3 3 3   Attention/ Calculation 4 4 5   Recall 3 2 3   Language- name 2 objects 2 2 2   Language- repeat 1 1 1   Language- follow 3 step command 3 3 3   Language- read & follow direction 1 1 1   Write a sentence 1 1 1   Copy design 1 1 1   Copy design-comments named 14 animals - -  Total score 29 28 30     Neurological examination  Mentation: Alert oriented to time, place, history taking. Follows all commands speech and language fluent Cranial nerve II-XII: Pupils were equal round reactive to light. Extraocular movements were full, visual field were full on confrontational test. Facial sensation and strength were normal.  Head turning and shoulder shrug  were normal and symmetric. Motor: The motor testing reveals 5 over 5 strength of all 4 extremities. Good symmetric motor tone is noted throughout.  Sensory: Sensory testing is intact to soft touch on all 4 extremities. No evidence of extinction is noted.  Coordination: Cerebellar testing reveals good finger-nose-finger and heel-to-shin bilaterally.  Gait and station: Gait is normal. Tandem gait is normal. Romberg is negative. No drift is seen.  Reflexes: Deep tendon reflexes are symmetric and normal bilaterally.   DIAGNOSTIC DATA (LABS, IMAGING, TESTING) - I reviewed  patient records, labs, notes, testing and imaging myself where available.  Lab Results  Component Value Date   WBC 5.2 04/15/2014   HGB 12.9 02/05/2019   HCT 38.0 02/05/2019   MCV 89.4 04/15/2014   PLT 218 04/15/2014      Component Value Date/Time   NA 142 02/05/2019 0712   K 4.1 02/05/2019 0712   CL 106 02/05/2019 0712   GLUCOSE 96 02/05/2019 0712   BUN 25 (H) 02/05/2019 0712   CREATININE 0.80 02/05/2019 0712   No results found for: CHOL, HDL, LDLCALC, LDLDIRECT, TRIG, CHOLHDL No results found for: HGBA1C Lab Results  Component Value Date   [id number] 08/16/2015   No results found for: TSH  ASSESSMENT AND PLAN 69 y.o. year old female  has a past medical history of Acid reflux, Anxiety, Diverticulosis, Hiatal hernia, Hypertension, Hypertension, Iron deficiency anemia (03/19/2014), Leukopenia (03/19/2014), Melanoma ([place]) (2018), and Memory change (08/16/2015). here with:  1.  Memory disturbance  She is doing really well at this time.  Her memory score is 29/30. Her memory issues seem more related to stress and anxiety, which is under good control.  She is not on any memory medications.  We will follow her memory over time, she will follow-up in 1 year or sooner if needed.   I spent 15 minutes with the patient. 50% of this time was spent discussing her plan of care.   Butler Denmark, AGNP-C, DNP 06/26/2019, 11:27 AM [facility] [address] [place], [place] [address] [phone]

## 2019-06-26 ENCOUNTER — Ambulatory Visit: Payer: Medicare PPO | Admitting: Neurology

## 2019-06-26 ENCOUNTER — Other Ambulatory Visit: Payer: Self-pay

## 2019-06-26 ENCOUNTER — Encounter: Payer: Self-pay | Admitting: Neurology

## 2019-06-26 VITALS — BP 167/79 | HR 55 | Temp 96.2°F | Ht 62.75 in | Wt 138.8 lb

## 2019-06-26 DIAGNOSIS — R413 Other amnesia: Secondary | ICD-10-CM

## 2019-06-26 NOTE — Patient Instructions (Signed)
It was great to see you again! The memory looks good, was 29/30 today.  Continue follow-up with primary doctor, see you in 1 year!

## 2019-06-27 NOTE — Progress Notes (Signed)
I have read the note, and I agree with the clinical assessment and plan.  Maximilliano Kersh K Clarise Chacko   

## 2019-07-04 ENCOUNTER — Ambulatory Visit: Payer: Medicare Other

## 2019-07-09 ENCOUNTER — Ambulatory Visit: Payer: Medicare PPO | Attending: Internal Medicine

## 2019-07-09 DIAGNOSIS — Z23 Encounter for immunization: Secondary | ICD-10-CM | POA: Insufficient documentation

## 2019-07-09 NOTE — Progress Notes (Signed)
   Covid-19 Vaccination Clinic  Name:  AVEAH COURTOIS    MRN: [id number] DOB: [birth year]  07/09/2019  Ms. Ringgold was observed post Covid-19 immunization for 15 minutes without incidence. She was provided with Vaccine Information Sheet and instruction to access the V-Safe system.   Ms. Sabogal was instructed to call 911 with any severe reactions post vaccine: Marland Kitchen Difficulty breathing  . Swelling of your face and throat  . A fast heartbeat  . A bad rash all over your body  . Dizziness and weakness    Immunizations Administered    Name Date Dose VIS Date Route   Pfizer COVID-19 Vaccine 07/09/2019 11:38 AM 0.3 mL 05/09/2019 Intramuscular   Manufacturer: [place]   Lot: [id number]   [place]: [id number]

## 2019-07-23 ENCOUNTER — Encounter: Payer: Self-pay | Admitting: Obstetrics & Gynecology

## 2019-09-24 DIAGNOSIS — Z85828 Personal history of other malignant neoplasm of skin: Secondary | ICD-10-CM | POA: Diagnosis not present

## 2019-09-24 DIAGNOSIS — D1801 Hemangioma of skin and subcutaneous tissue: Secondary | ICD-10-CM | POA: Diagnosis not present

## 2019-09-24 DIAGNOSIS — L820 Inflamed seborrheic keratosis: Secondary | ICD-10-CM | POA: Diagnosis not present

## 2019-09-24 DIAGNOSIS — L814 Other melanin hyperpigmentation: Secondary | ICD-10-CM | POA: Diagnosis not present

## 2019-09-24 DIAGNOSIS — L988 Other specified disorders of the skin and subcutaneous tissue: Secondary | ICD-10-CM | POA: Diagnosis not present

## 2019-09-24 DIAGNOSIS — D2272 Melanocytic nevi of left lower limb, including hip: Secondary | ICD-10-CM | POA: Diagnosis not present

## 2019-09-24 DIAGNOSIS — L821 Other seborrheic keratosis: Secondary | ICD-10-CM | POA: Diagnosis not present

## 2019-09-24 DIAGNOSIS — L918 Other hypertrophic disorders of the skin: Secondary | ICD-10-CM | POA: Diagnosis not present

## 2019-09-24 DIAGNOSIS — D485 Neoplasm of uncertain behavior of skin: Secondary | ICD-10-CM | POA: Diagnosis not present

## 2019-09-24 DIAGNOSIS — D2271 Melanocytic nevi of right lower limb, including hip: Secondary | ICD-10-CM | POA: Diagnosis not present

## 2019-09-26 DIAGNOSIS — Z1159 Encounter for screening for other viral diseases: Secondary | ICD-10-CM | POA: Diagnosis not present

## 2019-10-01 DIAGNOSIS — Z86010 Personal history of colonic polyps: Secondary | ICD-10-CM | POA: Diagnosis not present

## 2019-10-01 DIAGNOSIS — K5730 Diverticulosis of large intestine without perforation or abscess without bleeding: Secondary | ICD-10-CM | POA: Diagnosis not present

## 2019-10-09 DIAGNOSIS — M6281 Muscle weakness (generalized): Secondary | ICD-10-CM | POA: Diagnosis not present

## 2019-10-09 DIAGNOSIS — M545 Low back pain: Secondary | ICD-10-CM | POA: Diagnosis not present

## 2019-10-09 DIAGNOSIS — M5442 Lumbago with sciatica, left side: Secondary | ICD-10-CM | POA: Diagnosis not present

## 2019-10-09 DIAGNOSIS — M5416 Radiculopathy, lumbar region: Secondary | ICD-10-CM | POA: Diagnosis not present

## 2019-10-15 DIAGNOSIS — M6281 Muscle weakness (generalized): Secondary | ICD-10-CM | POA: Diagnosis not present

## 2019-10-15 DIAGNOSIS — M5416 Radiculopathy, lumbar region: Secondary | ICD-10-CM | POA: Diagnosis not present

## 2019-10-17 DIAGNOSIS — M6281 Muscle weakness (generalized): Secondary | ICD-10-CM | POA: Diagnosis not present

## 2019-10-17 DIAGNOSIS — M5416 Radiculopathy, lumbar region: Secondary | ICD-10-CM | POA: Diagnosis not present

## 2019-10-20 DIAGNOSIS — M5416 Radiculopathy, lumbar region: Secondary | ICD-10-CM | POA: Diagnosis not present

## 2019-10-20 DIAGNOSIS — M6281 Muscle weakness (generalized): Secondary | ICD-10-CM | POA: Diagnosis not present

## 2019-10-22 DIAGNOSIS — M6281 Muscle weakness (generalized): Secondary | ICD-10-CM | POA: Diagnosis not present

## 2019-10-22 DIAGNOSIS — M5416 Radiculopathy, lumbar region: Secondary | ICD-10-CM | POA: Diagnosis not present

## 2019-11-03 DIAGNOSIS — M5416 Radiculopathy, lumbar region: Secondary | ICD-10-CM | POA: Diagnosis not present

## 2019-11-03 DIAGNOSIS — M6281 Muscle weakness (generalized): Secondary | ICD-10-CM | POA: Diagnosis not present

## 2019-11-05 DIAGNOSIS — M6281 Muscle weakness (generalized): Secondary | ICD-10-CM | POA: Diagnosis not present

## 2019-11-05 DIAGNOSIS — M5416 Radiculopathy, lumbar region: Secondary | ICD-10-CM | POA: Diagnosis not present

## 2019-11-06 DIAGNOSIS — D0361 Melanoma in situ of right upper limb, including shoulder: Secondary | ICD-10-CM | POA: Diagnosis not present

## 2019-11-06 DIAGNOSIS — Z85828 Personal history of other malignant neoplasm of skin: Secondary | ICD-10-CM | POA: Diagnosis not present

## 2019-11-11 DIAGNOSIS — M6281 Muscle weakness (generalized): Secondary | ICD-10-CM | POA: Diagnosis not present

## 2019-11-11 DIAGNOSIS — M5416 Radiculopathy, lumbar region: Secondary | ICD-10-CM | POA: Diagnosis not present

## 2019-11-13 DIAGNOSIS — M5416 Radiculopathy, lumbar region: Secondary | ICD-10-CM | POA: Diagnosis not present

## 2019-11-13 DIAGNOSIS — M6281 Muscle weakness (generalized): Secondary | ICD-10-CM | POA: Diagnosis not present

## 2019-11-17 DIAGNOSIS — M6281 Muscle weakness (generalized): Secondary | ICD-10-CM | POA: Diagnosis not present

## 2019-11-17 DIAGNOSIS — M5416 Radiculopathy, lumbar region: Secondary | ICD-10-CM | POA: Diagnosis not present

## 2019-11-19 DIAGNOSIS — M6281 Muscle weakness (generalized): Secondary | ICD-10-CM | POA: Diagnosis not present

## 2019-11-19 DIAGNOSIS — M5416 Radiculopathy, lumbar region: Secondary | ICD-10-CM | POA: Diagnosis not present

## 2019-12-03 DIAGNOSIS — M2170 Unequal limb length (acquired), unspecified site: Secondary | ICD-10-CM | POA: Diagnosis not present

## 2019-12-03 DIAGNOSIS — M5416 Radiculopathy, lumbar region: Secondary | ICD-10-CM | POA: Diagnosis not present

## 2019-12-10 DIAGNOSIS — M545 Low back pain: Secondary | ICD-10-CM | POA: Diagnosis not present

## 2019-12-16 DIAGNOSIS — M47816 Spondylosis without myelopathy or radiculopathy, lumbar region: Secondary | ICD-10-CM | POA: Diagnosis not present

## 2019-12-16 DIAGNOSIS — M545 Low back pain: Secondary | ICD-10-CM | POA: Diagnosis not present

## 2019-12-16 DIAGNOSIS — M5416 Radiculopathy, lumbar region: Secondary | ICD-10-CM | POA: Diagnosis not present

## 2019-12-19 DIAGNOSIS — M48061 Spinal stenosis, lumbar region without neurogenic claudication: Secondary | ICD-10-CM | POA: Diagnosis not present

## 2019-12-19 DIAGNOSIS — M5416 Radiculopathy, lumbar region: Secondary | ICD-10-CM | POA: Diagnosis not present

## 2019-12-22 DIAGNOSIS — M48061 Spinal stenosis, lumbar region without neurogenic claudication: Secondary | ICD-10-CM | POA: Diagnosis not present

## 2019-12-22 DIAGNOSIS — M5416 Radiculopathy, lumbar region: Secondary | ICD-10-CM | POA: Diagnosis not present

## 2019-12-24 DIAGNOSIS — M48061 Spinal stenosis, lumbar region without neurogenic claudication: Secondary | ICD-10-CM | POA: Diagnosis not present

## 2019-12-24 DIAGNOSIS — M5416 Radiculopathy, lumbar region: Secondary | ICD-10-CM | POA: Diagnosis not present

## 2019-12-29 DIAGNOSIS — I10 Essential (primary) hypertension: Secondary | ICD-10-CM | POA: Diagnosis not present

## 2019-12-29 DIAGNOSIS — M48061 Spinal stenosis, lumbar region without neurogenic claudication: Secondary | ICD-10-CM | POA: Diagnosis not present

## 2019-12-29 DIAGNOSIS — Z8249 Family history of ischemic heart disease and other diseases of the circulatory system: Secondary | ICD-10-CM | POA: Diagnosis not present

## 2019-12-29 DIAGNOSIS — M5416 Radiculopathy, lumbar region: Secondary | ICD-10-CM | POA: Diagnosis not present

## 2019-12-29 DIAGNOSIS — Z833 Family history of diabetes mellitus: Secondary | ICD-10-CM | POA: Diagnosis not present

## 2019-12-29 DIAGNOSIS — K219 Gastro-esophageal reflux disease without esophagitis: Secondary | ICD-10-CM | POA: Diagnosis not present

## 2019-12-29 DIAGNOSIS — M1990 Unspecified osteoarthritis, unspecified site: Secondary | ICD-10-CM | POA: Diagnosis not present

## 2019-12-29 DIAGNOSIS — Z85828 Personal history of other malignant neoplasm of skin: Secondary | ICD-10-CM | POA: Diagnosis not present

## 2019-12-29 DIAGNOSIS — Z791 Long term (current) use of non-steroidal anti-inflammatories (NSAID): Secondary | ICD-10-CM | POA: Diagnosis not present

## 2019-12-29 DIAGNOSIS — F419 Anxiety disorder, unspecified: Secondary | ICD-10-CM | POA: Diagnosis not present

## 2019-12-29 DIAGNOSIS — Z823 Family history of stroke: Secondary | ICD-10-CM | POA: Diagnosis not present

## 2019-12-31 DIAGNOSIS — M5416 Radiculopathy, lumbar region: Secondary | ICD-10-CM | POA: Diagnosis not present

## 2019-12-31 DIAGNOSIS — M48061 Spinal stenosis, lumbar region without neurogenic claudication: Secondary | ICD-10-CM | POA: Diagnosis not present

## 2020-01-07 DIAGNOSIS — M48061 Spinal stenosis, lumbar region without neurogenic claudication: Secondary | ICD-10-CM | POA: Diagnosis not present

## 2020-01-07 DIAGNOSIS — M5416 Radiculopathy, lumbar region: Secondary | ICD-10-CM | POA: Diagnosis not present

## 2020-01-09 DIAGNOSIS — M48061 Spinal stenosis, lumbar region without neurogenic claudication: Secondary | ICD-10-CM | POA: Diagnosis not present

## 2020-01-09 DIAGNOSIS — M5416 Radiculopathy, lumbar region: Secondary | ICD-10-CM | POA: Diagnosis not present

## 2020-01-27 DIAGNOSIS — M47816 Spondylosis without myelopathy or radiculopathy, lumbar region: Secondary | ICD-10-CM | POA: Diagnosis not present

## 2020-01-27 DIAGNOSIS — M5416 Radiculopathy, lumbar region: Secondary | ICD-10-CM | POA: Diagnosis not present

## 2020-03-16 DIAGNOSIS — H35372 Puckering of macula, left eye: Secondary | ICD-10-CM | POA: Diagnosis not present

## 2020-04-14 DIAGNOSIS — Z85820 Personal history of malignant melanoma of skin: Secondary | ICD-10-CM | POA: Diagnosis not present

## 2020-04-14 DIAGNOSIS — L570 Actinic keratosis: Secondary | ICD-10-CM | POA: Diagnosis not present

## 2020-04-14 DIAGNOSIS — D1801 Hemangioma of skin and subcutaneous tissue: Secondary | ICD-10-CM | POA: Diagnosis not present

## 2020-04-14 DIAGNOSIS — L814 Other melanin hyperpigmentation: Secondary | ICD-10-CM | POA: Diagnosis not present

## 2020-04-14 DIAGNOSIS — B351 Tinea unguium: Secondary | ICD-10-CM | POA: Diagnosis not present

## 2020-04-14 DIAGNOSIS — D225 Melanocytic nevi of trunk: Secondary | ICD-10-CM | POA: Diagnosis not present

## 2020-04-14 DIAGNOSIS — Z85828 Personal history of other malignant neoplasm of skin: Secondary | ICD-10-CM | POA: Diagnosis not present

## 2020-04-14 DIAGNOSIS — L720 Epidermal cyst: Secondary | ICD-10-CM | POA: Diagnosis not present

## 2020-04-14 DIAGNOSIS — L821 Other seborrheic keratosis: Secondary | ICD-10-CM | POA: Diagnosis not present

## 2020-05-03 DIAGNOSIS — I10 Essential (primary) hypertension: Secondary | ICD-10-CM | POA: Diagnosis not present

## 2020-05-03 DIAGNOSIS — E559 Vitamin D deficiency, unspecified: Secondary | ICD-10-CM | POA: Diagnosis not present

## 2020-05-03 DIAGNOSIS — Z79899 Other long term (current) drug therapy: Secondary | ICD-10-CM | POA: Diagnosis not present

## 2020-05-06 DIAGNOSIS — E559 Vitamin D deficiency, unspecified: Secondary | ICD-10-CM | POA: Diagnosis not present

## 2020-05-06 DIAGNOSIS — I701 Atherosclerosis of renal artery: Secondary | ICD-10-CM | POA: Diagnosis not present

## 2020-05-06 DIAGNOSIS — D039 Melanoma in situ, unspecified: Secondary | ICD-10-CM | POA: Diagnosis not present

## 2020-05-06 DIAGNOSIS — F419 Anxiety disorder, unspecified: Secondary | ICD-10-CM | POA: Diagnosis not present

## 2020-05-06 DIAGNOSIS — I10 Essential (primary) hypertension: Secondary | ICD-10-CM | POA: Diagnosis not present

## 2020-05-06 DIAGNOSIS — Z79899 Other long term (current) drug therapy: Secondary | ICD-10-CM | POA: Diagnosis not present

## 2020-05-06 DIAGNOSIS — Z0000 Encounter for general adult medical examination without abnormal findings: Secondary | ICD-10-CM | POA: Diagnosis not present

## 2020-05-06 DIAGNOSIS — F411 Generalized anxiety disorder: Secondary | ICD-10-CM | POA: Diagnosis not present

## 2020-05-06 DIAGNOSIS — K219 Gastro-esophageal reflux disease without esophagitis: Secondary | ICD-10-CM | POA: Diagnosis not present

## 2020-05-18 DIAGNOSIS — L601 Onycholysis: Secondary | ICD-10-CM | POA: Diagnosis not present

## 2020-06-07 ENCOUNTER — Other Ambulatory Visit: Payer: Self-pay | Admitting: Obstetrics & Gynecology

## 2020-06-10 NOTE — Telephone Encounter (Signed)
Medication refill request: Anusol rectal cream  Last AEX:  03-28-2019 SM  Next AEX: 06-22-20 KD Last MMG (if hormonal medication request): n/a Refill authorized: Today, please advise.   Medication pended for #30g, 1RF. Please refill if appropriate.

## 2020-06-21 NOTE — Progress Notes (Signed)
70 y.o. G61P2003 Married White or Caucasian female here for breast & pelvic exam.      Patient's last menstrual period was 05/30/2003.          Sexually active: No.  The current method of family planning is post menopausal status.    Exercising: No.  exercise [employer] for strength training.  Smoker:  no  Health Maintenance: Pap:  12-10-17 neg HPV HR neg History of abnormal Pap:  yes MMG:  07-23-2019 category b density birads 1:neg Colonoscopy:  2021 per patient BMD:   07-12-16 osteopenia TDaP:  2019 Gardasil:   n/a Covid-19: pfizer Hep C testing: donates blood   reports that she has never smoked. She has never used smokeless tobacco. She reports current alcohol use of about 1.0 standard drink of alcohol per week. She reports that she does not use drugs.  Past Medical History:  Diagnosis Date  . Acid reflux   . Anxiety   . Diverticulosis   . Hiatal hernia   . Hypertension    off medication since 9/13  . Hypertension   . Iron deficiency anemia 03/19/2014  . 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 Melanoma ([place]) 2018   On back.  Dr. Ubaldo Glassing follows.  . Memory change 08/16/2015    Past Surgical History:  Procedure Laterality Date  . COLONOSCOPY WITH ESOPHAGOGASTRODUODENOSCOPY (EGD)    . RENAL ANGIOGRAPHY Right 02/05/2019   Procedure: RENAL ANGIOGRAPHY;  Surgeon: Marty Heck, MD;  Location: [facility];  Service: Cardiovascular;  Laterality: Right;    Current Outpatient Medications  Medication Sig Dispense Refill  . ALPRAZolam (XANAX) 0.25 MG tablet Take 0.25 mg by mouth daily as needed for anxiety.     Marland Kitchen amLODipine-valsartan (EXFORGE) 5-320 MG tablet TAKE 1 TABLET BY MOUTH EVERY DAY IN THE MORNING    . atenolol (TENORMIN) 100 MG tablet Take 100 mg by mouth daily.   7  . cholecalciferol (VITAMIN D) 1000 units tablet Take 1,000 Units by mouth 2 (two) times daily.     . hydrocortisone (ANUSOL-HC) 2.5 % rectal cream Place rectally 2 (two) times daily. Use for up to 7  days. (Patient taking differently: Place 1 application rectally 2 (two) times daily as needed for hemorrhoids. Use for up to 7 days.) 30 g 1  . Meloxicam 15 MG TBDP Take 15 mg by mouth daily as needed (pain.).     Marland Kitchen pantoprazole (PROTONIX) 20 MG tablet Take 20 mg by mouth daily before breakfast.     . terbinafine (LAMISIL) 250 MG tablet 1 tablet    . tetrahydrozoline 0.05 % ophthalmic solution Place 1-2 drops into both eyes 3 (three) times daily as needed (dry/irritated eyes.).     No current facility-administered medications for this visit.    Family History  Problem Relation Age of Onset  . Stroke Mother   . Dementia Mother   . Lymphoma Brother             Review of Systems  Constitutional: Negative.   HENT: Negative.   Eyes: Negative.   Respiratory: Negative.   Cardiovascular: Negative.   Gastrointestinal: Negative.   Endocrine: Negative.   Genitourinary: Negative.   Musculoskeletal: Negative.   Skin: Negative.   Allergic/Immunologic: Negative.   Neurological: Negative.   Hematological: Negative.   Psychiatric/Behavioral: Negative.     Exam:   BP 116/74   Pulse 68   Resp 16   Ht 5' 2.25" (1.581 m)   Wt 140 lb (63.5 kg)  LMP 05/30/2003   BMI 25.40 kg/m   Height: 5' 2.25" (158.1 cm)  General appearance: alert, cooperative and appears stated age, no acute distress Head: Normocephalic, without obvious abnormality Breasts: No axillary or supraclavicular adenopathy, Normal to palpation without dominant masses Abdomen: soft, non-tender; no masses,  no organomegaly Neurologic: Grossly normal   Pelvic: External genitalia:  no lesions              Urethra:  normal appearing urethra with no masses, tenderness or lesions              Bartholins and Skenes: normal                 Vagina: normal appearing vagina, appropriate for age, normal appearing discharge, no lesions              Cervix: neg cervical motion tenderness, no visible lesions             Bimanual Exam:    Uterus:  normal size, contour, position, consistency, mobility, non-tender              Adnexa: no mass, fullness, tenderness              Rectal: small hemorrhoid not engorged at this time   Joy, CMA Chaperone was present for exam.  A:  Well Woman with normal exam  Breast and Pelvic screening  P:   Pap :cotesting due 2024  Mammogram: Pt will call to schedule at [facility], due 06/2020  Labs: with PCP  Medications: Anusol 2.5 mg prn  Dexa:order faxed to [facility], to be scheduled with mammogram (pt will call)

## 2020-06-22 ENCOUNTER — Encounter: Payer: Self-pay | Admitting: Nurse Practitioner

## 2020-06-22 ENCOUNTER — Ambulatory Visit: Payer: Medicare Other

## 2020-06-22 ENCOUNTER — Other Ambulatory Visit: Payer: Self-pay

## 2020-06-22 ENCOUNTER — Ambulatory Visit: Payer: Medicare PPO | Admitting: Nurse Practitioner

## 2020-06-22 VITALS — BP 116/74 | HR 68 | Resp 16 | Ht 62.25 in | Wt 140.0 lb

## 2020-06-22 DIAGNOSIS — Z01419 Encounter for gynecological examination (general) (routine) without abnormal findings: Secondary | ICD-10-CM

## 2020-06-22 MED ORDER — HYDROCORTISONE (PERIANAL) 2.5 % EX CREA: 1.0000 "application " | TOPICAL_CREAM | Freq: Two times a day (BID) | CUTANEOUS | 1 refills | Status: DC

## 2020-06-22 NOTE — Patient Instructions (Addendum)
Viactiv Calcium chews (500mg  calcium/ plus Vit D  TUMS try berry chews (600mg  Calcium)  Total recommendations: 1200-1500 mg Calcium daily 800 IU vitamin D  Kegel Exercises  Kegel exercises can help strengthen your pelvic floor muscles. The pelvic floor is a group of muscles that support your rectum, small intestine, and bladder. In females, pelvic floor muscles also help support the womb (uterus). These muscles help you control the flow of urine and stool. Kegel exercises are painless and simple, and they do not require any equipment. Your provider may suggest Kegel exercises to:  Improve bladder and bowel control.  Improve sexual response.  Improve weak pelvic floor muscles after surgery to remove the uterus (hysterectomy) or pregnancy (females).  Improve weak pelvic floor muscles after prostate gland removal or surgery (males). Kegel exercises involve squeezing your pelvic floor muscles, which are the same muscles you squeeze when you try to stop the flow of urine or keep from passing gas. The exercises can be done while sitting, standing, or lying down, but it is best to vary your position. Exercises How to do Kegel exercises: 1. Squeeze your pelvic floor muscles tight. You should feel a tight lift in your rectal area. If you are a female, you should also feel a tightness in your vaginal area. Keep your stomach, buttocks, and legs relaxed. 2. Hold the muscles tight for up to 10 seconds. 3. Breathe normally. 4. Relax your muscles. 5. Repeat as told by your health care provider. Repeat this exercise daily as told by your health care provider. Continue to do this exercise for at least 4-6 weeks, or for as long as told by your health care provider. You may be referred to a physical therapist who can help you learn more about how to do Kegel exercises. Depending on your condition, your health care provider may recommend:  Varying how long you squeeze your muscles.  Doing several sets  of exercises every day.  Doing exercises for several weeks.  Making Kegel exercises a part of your regular exercise routine. This information is not intended to replace advice given to you by your health care provider. Make sure you discuss any questions you have with your health care provider. Document Revised: 09/19/2019 Document Reviewed: 01/02/2018 Elsevier Patient Education  2021 [place].    Urinary Incontinence  Urinary incontinence refers to a condition in which a person is unable to control where and when to pass urine. A person with this condition will urinate when he or she does not mean to (involuntarily). What are the causes? This condition may be caused by:  Medicines.  Infections.  Constipation.  Overactive bladder muscles.  Weak bladder muscles.  Weak pelvic floor muscles. These muscles provide support for the bladder, intestine, and, in women, the uterus.  Enlarged prostate in men. The prostate is a gland near the bladder. When it gets too big, it can pinch the urethra. With the urethra blocked, the bladder can weaken and lose the ability to empty properly.  Surgery.  Emotional factors, such as anxiety, stress, or post-traumatic stress disorder (PTSD).  Pelvic organ prolapse. This happens in women when organs shift out of place and into the vagina. This shift can prevent the bladder and urethra from working properly. What increases the risk? The following factors may make you more likely to develop this condition:  Older age.  Obesity and physical inactivity.  Pregnancy and childbirth.  Menopause.  Diseases that affect the nerves or spinal cord (neurological diseases).  Long-term (chronic) coughing. This can increase pressure on the bladder and pelvic floor muscles. What are the signs or symptoms? Symptoms may vary depending on the type of urinary incontinence you have. They include:  A sudden urge to urinate, but passing urine involuntarily  before you can get to a bathroom (urge incontinence).  Suddenly passing urine with any activity that forces urine to pass, such as coughing, laughing, exercise, or sneezing (stress incontinence).  Needing to urinate often, but urinating only a small amount, or constantly dribbling urine (overflow incontinence).  Urinating because you cannot get to the bathroom in time due to a physical disability, such as arthritis or injury, or communication and thinking problems, such as Alzheimer disease (functional incontinence). How is this diagnosed? This condition may be diagnosed based on:  Your medical history.  A physical exam.  Tests, such as: ? Urine tests. ? X-rays of your kidney and bladder. ? Ultrasound. ? CT scan. ? Cystoscopy. In this procedure, a health care provider inserts a tube with a light and camera (cystoscope) through the urethra and into the bladder in order to check for problems. ? Urodynamic testing. These tests assess how well the bladder, urethra, and sphincter can store and release urine. There are different types of urodynamic tests, and they vary depending on what the test is measuring. To help diagnose your condition, your health care provider may recommend that you keep a log of when you urinate and how much you urinate. How is this treated? Treatment for this condition depends on the type of incontinence that you have and its cause. Treatment may include:  Lifestyle changes, such as: ? Quitting smoking. ? Maintaining a healthy weight. ? Staying active. Try to get 150 minutes of moderate-intensity exercise every week. Ask your health care provider which activities are safe for you. ? Eating a healthy diet.  Avoid high-fat foods, like fried foods.  Avoid refined carbohydrates like white bread and white rice.  Limit how much alcohol and caffeine you drink.  Increase your fiber intake. Foods such as fresh fruits, vegetables, beans, and whole grains are healthy  sources of fiber.  Pelvic floor muscle exercises.  Bladder training, such as lengthening the amount of time between bathroom breaks, or using the bathroom at regular intervals.  Using techniques to suppress bladder urges. This can include distraction techniques or controlled breathing exercises.  Medicines to relax the bladder muscles and prevent bladder spasms.  Medicines to help slow or prevent the growth of a man's prostate.  Botox injections. These can help relax the bladder muscles.  Using pulses of electricity to help change bladder reflexes (electrical nerve stimulation).  For women, using a medical device to prevent urine leaks. This is a small, tampon-like, disposable device that is inserted into the urethra.  Injecting collagen or carbon beads (bulking agents) into the urinary sphincter. These can help thicken tissue and close the bladder opening.  Surgery. Follow these instructions at home: Lifestyle  Limit alcohol and caffeine. These can fill your bladder quickly and irritate it.  Keep yourself clean to help prevent odors and skin damage. Ask your doctor about special skin creams and cleansers that can protect the skin from urine.  Consider wearing pads or adult diapers. Make sure to change them regularly, and always change them right after experiencing incontinence. General instructions  Take over-the-counter and prescription medicines only as told by your health care provider.  Use the bathroom about every 3-4 hours, even if you do not feel the  need to urinate. Try to empty your bladder completely every time. After urinating, wait a minute. Then try to urinate again.  Make sure you are in a relaxed position while urinating.  If your incontinence is caused by nerve problems, keep a log of the medicines you take and the times you go to the bathroom.  Keep all follow-up visits as told by your health care provider. This is important. Contact a health care provider  if:  You have pain that gets worse.  Your incontinence gets worse. Get help right away if:  You have a fever or chills.  You are unable to urinate.  You have redness in your groin area or down your legs. Summary  Urinary incontinence refers to a condition in which a person is unable to control where and when to pass urine.  This condition may be caused by medicines, infection, weak bladder muscles, weak pelvic floor muscles, enlargement of the prostate (in men), or surgery.  The following factors increase your risk for developing this condition: older age, obesity, pregnancy and childbirth, menopause, neurological diseases, and chronic coughing.  There are several types of urinary incontinence. They include urge incontinence, stress incontinence, overflow incontinence, and functional incontinence.  This condition is usually treated first with lifestyle and behavioral changes, such as quitting smoking, eating a healthier diet, and doing regular pelvic floor exercises. Other treatment options include medicines, bulking agents, medical devices, electrical nerve stimulation, or surgery. This information is not intended to replace advice given to you by your health care provider. Make sure you discuss any questions you have with your health care provider. Document Revised: 05/25/2017 Document Reviewed: 08/24/2016 Elsevier Patient Education  [place].

## 2020-06-23 NOTE — Telephone Encounter (Signed)
Seen 05/02/21 ?

## 2020-06-28 ENCOUNTER — Encounter: Payer: Self-pay | Admitting: Neurology

## 2020-06-28 ENCOUNTER — Ambulatory Visit: Payer: Medicare PPO | Admitting: Neurology

## 2020-06-28 VITALS — BP 157/71 | HR 88 | Ht 62.0 in | Wt 140.0 lb

## 2020-06-28 DIAGNOSIS — R413 Other amnesia: Secondary | ICD-10-CM

## 2020-06-28 NOTE — Patient Instructions (Signed)
Things look stable Happy early birthday! See you back in 1 year

## 2020-06-28 NOTE — Progress Notes (Signed)
I have read the note, and I agree with the clinical assessment and plan.  Quantisha Marsicano K Telissa Palmisano   

## 2020-06-28 NOTE — Progress Notes (Signed)
PATIENT: Carla Ramirez DOB: [birth year]  REASON FOR VISIT: follow up HISTORY FROM: patient  HISTORY OF PRESENT ILLNESS: Today 06/28/20 Carla Ramirez is a 70 year old female with history of mild memory disturbance.  Her trouble with memory is exacerbated by anxiety.  She is not on any memory medications.  MMSE 30/30 today.  Over the last year, has not noted any changes to the memory.  At times, she may feel scattered, forget what she went into her room for.  She may have difficulty remembering people's names.  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  Has gone back to singing in the choir.  She has Xanax to take if needed for anxiety, rarely has to take.  She did take 1 this morning, she was anxious about the appointment.  She drives a car, does her own ADLs, manages her household.  Has reported some back issues, is seeing orthopedics.  She will be turning 70 in a few days. Does 2 days a week of exercise group.  Here today for evaluation unaccompanied.  HISTORY 06/26/2019 SS: Carla Ramirez is a 70 year old female with history of mild memory disturbance.  She lives with her husband, performs her own ADLs.  She drives a car without difficulty.  Her trouble with memory is exacerbated by anxiety.  She is not on any memory medication.  She feels her memory is stable, really only has difficulty during times of stress or anxiety.  Due to the pandemic, she really has not had much stress or anxiety lately, therefore her memory has been well.  She does report at times she has difficulty remembering people's names, or may lose her train of thought, but she thinks this may just be normal with age.  She does report that her mother had dementia.  She has Xanax she may take as needed for anxiety.  Overall, she has been doing well, no new problems or concerns.  She does take medication for blood pressure.  She has been doing a lot of reading, and walks  regularly for exercise.  She presents today for follow-up unaccompanied.   REVIEW OF SYSTEMS: Out of a complete 14 system review of symptoms, the patient complains only of the following symptoms, and all other reviewed systems are negative.  Memory loss  ALLERGIES: Allergies  Allergen Reactions  . Sulfa Antibiotics Swelling    Facial swelling    HOME MEDICATIONS: Outpatient Medications Prior to Visit  Medication Sig Dispense Refill  . ALPRAZolam (XANAX) 0.25 MG tablet Take 0.25 mg by mouth daily as needed for anxiety.     Marland Kitchen amLODipine-valsartan (EXFORGE) 5-320 MG tablet TAKE 1 TABLET BY MOUTH EVERY DAY IN THE MORNING    . atenolol (TENORMIN) 100 MG tablet Take 100 mg by mouth daily.   7  . cholecalciferol (VITAMIN D) 1000 units tablet Take 1,000 Units by mouth 2 (two) times daily.     . Meloxicam 15 MG TBDP Take 15 mg by mouth daily as needed (pain.).     Marland Kitchen pantoprazole (PROTONIX) 20 MG tablet Take 20 mg by mouth daily before breakfast.     . terbinafine (LAMISIL) 250 MG tablet 1 tablet    . tetrahydrozoline 0.05 % ophthalmic solution Place 1-2 drops into both eyes 3 (three) times daily as needed (dry/irritated eyes.).    Marland Kitchen hydrocortisone (ANUSOL-HC) 2.5 % rectal cream Place 1 application rectally 2 (two) times daily. Use  for up to 7 days. Place on hold pt will call if needed 30 g 1   No facility-administered medications prior to visit.    PAST MEDICAL HISTORY: Past Medical History:  Diagnosis Date  . Acid reflux   . Anxiety   . Diverticulosis   . Hiatal hernia   . Hypertension    off medication since 9/13  . Hypertension   . Iron deficiency anemia 03/19/2014  . 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 Melanoma ([place]) 2018   On back.  Dr. Ubaldo Glassing follows.  . Memory change 08/16/2015    PAST SURGICAL HISTORY: Past Surgical History:  Procedure Laterality Date  . COLONOSCOPY WITH ESOPHAGOGASTRODUODENOSCOPY (EGD)    . RENAL ANGIOGRAPHY Right 02/05/2019   Procedure: RENAL ANGIOGRAPHY;   Surgeon: Marty Heck, MD;  Location: [facility];  Service: Cardiovascular;  Laterality: Right;    FAMILY HISTORY: Family History  Problem Relation Age of Onset  . Stroke Mother   . Dementia Mother   . Lymphoma Brother             SOCIAL HISTORY: Social History   Socioeconomic History  . Marital status: Married    Spouse name: Not on file  . Number of children: 3  . Years of education: Masters  . Highest education level: Not on file  Occupational History  . Not on file  Tobacco Use  . Smoking status: Never Smoker  . Smokeless tobacco: Never Used  Vaping Use  . Vaping Use: Never used  Substance and Sexual Activity  . Alcohol use: Yes    Alcohol/week: 1.0 standard drink    Types: 1 Standard drinks or equivalent per week  . Drug use: No  . Sexual activity: Not Currently    Partners: Male    Birth control/protection: Post-menopausal  Other Topics Concern  . Not on file  Social History Narrative   Married lives with hsuband at home.  Retired.  Has Masters degree.  Has 3 children.    Right-handed   Caffeine:   Social Determinants of Radio broadcast assistant Strain: Not on file  Food Insecurity: Not on file  Transportation Needs: Not on file  Physical Activity: Not on file  Stress: Not on file  Social Connections: Not on file  Intimate Partner Violence: Not on file   PHYSICAL EXAM  Vitals:   06/28/20 1006  BP: (!) 157/71  Pulse: 88  Weight: 140 lb (63.5 kg)  Height: 5\' 2"  (1.575 m)   Body mass index is 25.61 kg/m.  Generalized: Well developed, in no acute distress  MMSE - Mini Mental State Exam 06/28/2020 06/26/2019 12/18/2018  Orientation to time 5 5 5   Orientation to Place 5 5 5   Registration 3 3 3   Attention/ Calculation 5 4 4   Recall 3 3 2   Language- name 2 objects 2 2 2   Language- repeat 1 1 1   Language- follow 3 step command 3 3 3   Language- read & follow direction 1 1 1   Write a sentence 1 1 1   Copy design 1 1 1   Copy  design-comments - named 14 animals -  Total score 30 29 28     Neurological examination  Mentation: Alert oriented to time, place, history taking. Follows all commands speech and language fluent Cranial nerve II-XII: Pupils were equal round reactive to light. Extraocular movements were full, visual field were full on confrontational test. Facial sensation and strength were normal.  Head turning and shoulder shrug  were normal and  symmetric. Motor: The motor testing reveals 5 over 5 strength of all 4 extremities. Good symmetric motor tone is noted throughout.  Sensory: Sensory testing is intact to soft touch on all 4 extremities. No evidence of extinction is noted.  Coordination: Cerebellar testing reveals good finger-nose-finger and heel-to-shin bilaterally.  Gait and station: Gait is normal. Tandem gait is normal. Romberg is negative. No drift is seen.  Reflexes: Deep tendon reflexes are symmetric and normal bilaterally.   DIAGNOSTIC DATA (LABS, IMAGING, TESTING) - I reviewed patient records, labs, notes, testing and imaging myself where available.  Lab Results  Component Value Date   WBC 5.2 04/15/2014   HGB 12.9 02/05/2019   HCT 38.0 02/05/2019   MCV 89.4 04/15/2014   PLT 218 04/15/2014      Component Value Date/Time   NA 142 02/05/2019 0712   K 4.1 02/05/2019 0712   CL 106 02/05/2019 0712   GLUCOSE 96 02/05/2019 0712   BUN 25 (H) 02/05/2019 0712   CREATININE 0.80 02/05/2019 0712   No results found for: CHOL, HDL, LDLCALC, LDLDIRECT, TRIG, CHOLHDL No results found for: HGBA1C Lab Results  Component Value Date   [id number] 08/16/2015   No results found for: TSH  ASSESSMENT AND PLAN 70 y.o. year old female  has a past medical history of Acid reflux, Anxiety, Diverticulosis, Hiatal hernia, Hypertension, Hypertension, Iron deficiency anemia (03/19/2014), Leukopenia (03/19/2014), Melanoma ([place]) (2018), and Memory change (08/16/2015). here with:  1.  Mild memory  disturbance  Seems to continue to do really well at this time.  Her MMSE is 30/30.  Her memory issues seem more related to stress and anxiety, which are under good control at this time.  We will continue to follow her memory over time, she will follow-up in 1 year or sooner if needed.  I spent 20 minutes of face-to-face and non-face-to-face time with patient.  This included previsit chart review, lab review, study review, order entry, electronic health record documentation, patient education.  Butler Denmark, AGNP-C, DNP 06/28/2020, 10:19 AM [facility] [address] [place], [place] [address] [phone]

## 2020-06-30 DIAGNOSIS — G5601 Carpal tunnel syndrome, right upper limb: Secondary | ICD-10-CM | POA: Diagnosis not present

## 2020-06-30 DIAGNOSIS — M47816 Spondylosis without myelopathy or radiculopathy, lumbar region: Secondary | ICD-10-CM | POA: Diagnosis not present

## 2020-07-09 DIAGNOSIS — M5416 Radiculopathy, lumbar region: Secondary | ICD-10-CM | POA: Diagnosis not present

## 2020-07-09 DIAGNOSIS — M48061 Spinal stenosis, lumbar region without neurogenic claudication: Secondary | ICD-10-CM | POA: Diagnosis not present

## 2020-07-14 DIAGNOSIS — M48061 Spinal stenosis, lumbar region without neurogenic claudication: Secondary | ICD-10-CM | POA: Diagnosis not present

## 2020-07-14 DIAGNOSIS — M5416 Radiculopathy, lumbar region: Secondary | ICD-10-CM | POA: Diagnosis not present

## 2020-07-19 DIAGNOSIS — M48061 Spinal stenosis, lumbar region without neurogenic claudication: Secondary | ICD-10-CM | POA: Diagnosis not present

## 2020-07-19 DIAGNOSIS — M5416 Radiculopathy, lumbar region: Secondary | ICD-10-CM | POA: Diagnosis not present

## 2020-07-21 DIAGNOSIS — M48061 Spinal stenosis, lumbar region without neurogenic claudication: Secondary | ICD-10-CM | POA: Diagnosis not present

## 2020-07-21 DIAGNOSIS — M5416 Radiculopathy, lumbar region: Secondary | ICD-10-CM | POA: Diagnosis not present

## 2020-07-26 DIAGNOSIS — M5416 Radiculopathy, lumbar region: Secondary | ICD-10-CM | POA: Diagnosis not present

## 2020-07-26 DIAGNOSIS — M48061 Spinal stenosis, lumbar region without neurogenic claudication: Secondary | ICD-10-CM | POA: Diagnosis not present

## 2020-07-28 ENCOUNTER — Encounter: Payer: Self-pay | Admitting: Nurse Practitioner

## 2020-07-28 DIAGNOSIS — M5416 Radiculopathy, lumbar region: Secondary | ICD-10-CM | POA: Diagnosis not present

## 2020-07-28 DIAGNOSIS — Z1231 Encounter for screening mammogram for malignant neoplasm of breast: Secondary | ICD-10-CM | POA: Diagnosis not present

## 2020-07-28 DIAGNOSIS — M48061 Spinal stenosis, lumbar region without neurogenic claudication: Secondary | ICD-10-CM | POA: Diagnosis not present

## 2020-07-28 DIAGNOSIS — M85851 Other specified disorders of bone density and structure, right thigh: Secondary | ICD-10-CM | POA: Diagnosis not present

## 2020-07-30 ENCOUNTER — Encounter: Payer: Self-pay | Admitting: Nurse Practitioner

## 2020-08-02 DIAGNOSIS — M5416 Radiculopathy, lumbar region: Secondary | ICD-10-CM | POA: Diagnosis not present

## 2020-08-02 DIAGNOSIS — M48061 Spinal stenosis, lumbar region without neurogenic claudication: Secondary | ICD-10-CM | POA: Diagnosis not present

## 2020-08-04 DIAGNOSIS — M48061 Spinal stenosis, lumbar region without neurogenic claudication: Secondary | ICD-10-CM | POA: Diagnosis not present

## 2020-08-04 DIAGNOSIS — M5416 Radiculopathy, lumbar region: Secondary | ICD-10-CM | POA: Diagnosis not present

## 2020-08-09 DIAGNOSIS — M48061 Spinal stenosis, lumbar region without neurogenic claudication: Secondary | ICD-10-CM | POA: Diagnosis not present

## 2020-08-09 DIAGNOSIS — M5416 Radiculopathy, lumbar region: Secondary | ICD-10-CM | POA: Diagnosis not present

## 2020-08-10 DIAGNOSIS — I10 Essential (primary) hypertension: Secondary | ICD-10-CM | POA: Diagnosis not present

## 2020-08-10 DIAGNOSIS — Z809 Family history of malignant neoplasm, unspecified: Secondary | ICD-10-CM | POA: Diagnosis not present

## 2020-08-10 DIAGNOSIS — R32 Unspecified urinary incontinence: Secondary | ICD-10-CM | POA: Diagnosis not present

## 2020-08-10 DIAGNOSIS — Z791 Long term (current) use of non-steroidal anti-inflammatories (NSAID): Secondary | ICD-10-CM | POA: Diagnosis not present

## 2020-08-10 DIAGNOSIS — K219 Gastro-esophageal reflux disease without esophagitis: Secondary | ICD-10-CM | POA: Diagnosis not present

## 2020-08-10 DIAGNOSIS — Z8249 Family history of ischemic heart disease and other diseases of the circulatory system: Secondary | ICD-10-CM | POA: Diagnosis not present

## 2020-08-10 DIAGNOSIS — Z823 Family history of stroke: Secondary | ICD-10-CM | POA: Diagnosis not present

## 2020-08-10 DIAGNOSIS — M1990 Unspecified osteoarthritis, unspecified site: Secondary | ICD-10-CM | POA: Diagnosis not present

## 2020-08-10 DIAGNOSIS — F411 Generalized anxiety disorder: Secondary | ICD-10-CM | POA: Diagnosis not present

## 2020-08-11 DIAGNOSIS — M5416 Radiculopathy, lumbar region: Secondary | ICD-10-CM | POA: Diagnosis not present

## 2020-08-11 DIAGNOSIS — M48061 Spinal stenosis, lumbar region without neurogenic claudication: Secondary | ICD-10-CM | POA: Diagnosis not present

## 2020-08-16 DIAGNOSIS — M5416 Radiculopathy, lumbar region: Secondary | ICD-10-CM | POA: Diagnosis not present

## 2020-08-16 DIAGNOSIS — M48061 Spinal stenosis, lumbar region without neurogenic claudication: Secondary | ICD-10-CM | POA: Diagnosis not present

## 2020-08-18 DIAGNOSIS — M48061 Spinal stenosis, lumbar region without neurogenic claudication: Secondary | ICD-10-CM | POA: Diagnosis not present

## 2020-08-18 DIAGNOSIS — M5416 Radiculopathy, lumbar region: Secondary | ICD-10-CM | POA: Diagnosis not present

## 2020-09-23 DIAGNOSIS — D225 Melanocytic nevi of trunk: Secondary | ICD-10-CM | POA: Diagnosis not present

## 2020-09-23 DIAGNOSIS — Z85828 Personal history of other malignant neoplasm of skin: Secondary | ICD-10-CM | POA: Diagnosis not present

## 2020-09-23 DIAGNOSIS — D2272 Melanocytic nevi of left lower limb, including hip: Secondary | ICD-10-CM | POA: Diagnosis not present

## 2020-09-23 DIAGNOSIS — L821 Other seborrheic keratosis: Secondary | ICD-10-CM | POA: Diagnosis not present

## 2020-09-23 DIAGNOSIS — L814 Other melanin hyperpigmentation: Secondary | ICD-10-CM | POA: Diagnosis not present

## 2020-09-23 DIAGNOSIS — L570 Actinic keratosis: Secondary | ICD-10-CM | POA: Diagnosis not present

## 2020-09-23 DIAGNOSIS — L820 Inflamed seborrheic keratosis: Secondary | ICD-10-CM | POA: Diagnosis not present

## 2020-09-23 DIAGNOSIS — Z85820 Personal history of malignant melanoma of skin: Secondary | ICD-10-CM | POA: Diagnosis not present

## 2020-09-23 DIAGNOSIS — D2271 Melanocytic nevi of right lower limb, including hip: Secondary | ICD-10-CM | POA: Diagnosis not present

## 2020-09-24 DIAGNOSIS — M48061 Spinal stenosis, lumbar region without neurogenic claudication: Secondary | ICD-10-CM | POA: Diagnosis not present

## 2020-09-24 DIAGNOSIS — M4316 Spondylolisthesis, lumbar region: Secondary | ICD-10-CM | POA: Diagnosis not present

## 2020-09-24 DIAGNOSIS — M5416 Radiculopathy, lumbar region: Secondary | ICD-10-CM | POA: Diagnosis not present

## 2020-09-27 DIAGNOSIS — M4316 Spondylolisthesis, lumbar region: Secondary | ICD-10-CM | POA: Diagnosis not present

## 2020-09-27 DIAGNOSIS — M5416 Radiculopathy, lumbar region: Secondary | ICD-10-CM | POA: Diagnosis not present

## 2020-09-27 DIAGNOSIS — M48061 Spinal stenosis, lumbar region without neurogenic claudication: Secondary | ICD-10-CM | POA: Diagnosis not present

## 2020-10-01 DIAGNOSIS — M5416 Radiculopathy, lumbar region: Secondary | ICD-10-CM | POA: Diagnosis not present

## 2020-10-01 DIAGNOSIS — M4316 Spondylolisthesis, lumbar region: Secondary | ICD-10-CM | POA: Diagnosis not present

## 2020-10-01 DIAGNOSIS — M48061 Spinal stenosis, lumbar region without neurogenic claudication: Secondary | ICD-10-CM | POA: Diagnosis not present

## 2020-10-04 DIAGNOSIS — M5416 Radiculopathy, lumbar region: Secondary | ICD-10-CM | POA: Diagnosis not present

## 2020-10-04 DIAGNOSIS — M4316 Spondylolisthesis, lumbar region: Secondary | ICD-10-CM | POA: Diagnosis not present

## 2020-10-04 DIAGNOSIS — M48061 Spinal stenosis, lumbar region without neurogenic claudication: Secondary | ICD-10-CM | POA: Diagnosis not present

## 2020-10-06 DIAGNOSIS — M48061 Spinal stenosis, lumbar region without neurogenic claudication: Secondary | ICD-10-CM | POA: Diagnosis not present

## 2020-10-06 DIAGNOSIS — M4316 Spondylolisthesis, lumbar region: Secondary | ICD-10-CM | POA: Diagnosis not present

## 2020-10-06 DIAGNOSIS — M5416 Radiculopathy, lumbar region: Secondary | ICD-10-CM | POA: Diagnosis not present

## 2020-10-13 DIAGNOSIS — M48061 Spinal stenosis, lumbar region without neurogenic claudication: Secondary | ICD-10-CM | POA: Diagnosis not present

## 2020-10-13 DIAGNOSIS — M4316 Spondylolisthesis, lumbar region: Secondary | ICD-10-CM | POA: Diagnosis not present

## 2020-10-13 DIAGNOSIS — M5416 Radiculopathy, lumbar region: Secondary | ICD-10-CM | POA: Diagnosis not present

## 2020-10-15 DIAGNOSIS — M48061 Spinal stenosis, lumbar region without neurogenic claudication: Secondary | ICD-10-CM | POA: Diagnosis not present

## 2020-10-15 DIAGNOSIS — M4316 Spondylolisthesis, lumbar region: Secondary | ICD-10-CM | POA: Diagnosis not present

## 2020-10-15 DIAGNOSIS — M5416 Radiculopathy, lumbar region: Secondary | ICD-10-CM | POA: Diagnosis not present

## 2020-10-18 DIAGNOSIS — M48061 Spinal stenosis, lumbar region without neurogenic claudication: Secondary | ICD-10-CM | POA: Diagnosis not present

## 2020-10-18 DIAGNOSIS — M4316 Spondylolisthesis, lumbar region: Secondary | ICD-10-CM | POA: Diagnosis not present

## 2020-10-18 DIAGNOSIS — M5416 Radiculopathy, lumbar region: Secondary | ICD-10-CM | POA: Diagnosis not present

## 2020-10-22 DIAGNOSIS — M5416 Radiculopathy, lumbar region: Secondary | ICD-10-CM | POA: Diagnosis not present

## 2020-10-22 DIAGNOSIS — M48061 Spinal stenosis, lumbar region without neurogenic claudication: Secondary | ICD-10-CM | POA: Diagnosis not present

## 2020-10-22 DIAGNOSIS — M4316 Spondylolisthesis, lumbar region: Secondary | ICD-10-CM | POA: Diagnosis not present

## 2020-11-01 DIAGNOSIS — M4316 Spondylolisthesis, lumbar region: Secondary | ICD-10-CM | POA: Diagnosis not present

## 2020-11-01 DIAGNOSIS — M5416 Radiculopathy, lumbar region: Secondary | ICD-10-CM | POA: Diagnosis not present

## 2020-11-01 DIAGNOSIS — M48061 Spinal stenosis, lumbar region without neurogenic claudication: Secondary | ICD-10-CM | POA: Diagnosis not present

## 2020-11-05 DIAGNOSIS — M4316 Spondylolisthesis, lumbar region: Secondary | ICD-10-CM | POA: Diagnosis not present

## 2020-11-05 DIAGNOSIS — M48061 Spinal stenosis, lumbar region without neurogenic claudication: Secondary | ICD-10-CM | POA: Diagnosis not present

## 2020-11-05 DIAGNOSIS — M5416 Radiculopathy, lumbar region: Secondary | ICD-10-CM | POA: Diagnosis not present

## 2020-11-08 DIAGNOSIS — M48061 Spinal stenosis, lumbar region without neurogenic claudication: Secondary | ICD-10-CM | POA: Diagnosis not present

## 2020-11-08 DIAGNOSIS — M5416 Radiculopathy, lumbar region: Secondary | ICD-10-CM | POA: Diagnosis not present

## 2020-11-08 DIAGNOSIS — M4316 Spondylolisthesis, lumbar region: Secondary | ICD-10-CM | POA: Diagnosis not present

## 2020-11-15 DIAGNOSIS — M48061 Spinal stenosis, lumbar region without neurogenic claudication: Secondary | ICD-10-CM | POA: Diagnosis not present

## 2020-11-15 DIAGNOSIS — M4316 Spondylolisthesis, lumbar region: Secondary | ICD-10-CM | POA: Diagnosis not present

## 2020-11-15 DIAGNOSIS — M5416 Radiculopathy, lumbar region: Secondary | ICD-10-CM | POA: Diagnosis not present

## 2020-11-19 DIAGNOSIS — M4316 Spondylolisthesis, lumbar region: Secondary | ICD-10-CM | POA: Diagnosis not present

## 2020-11-19 DIAGNOSIS — M48061 Spinal stenosis, lumbar region without neurogenic claudication: Secondary | ICD-10-CM | POA: Diagnosis not present

## 2020-11-19 DIAGNOSIS — M5416 Radiculopathy, lumbar region: Secondary | ICD-10-CM | POA: Diagnosis not present

## 2020-11-24 DIAGNOSIS — M5416 Radiculopathy, lumbar region: Secondary | ICD-10-CM | POA: Diagnosis not present

## 2020-11-24 DIAGNOSIS — M4316 Spondylolisthesis, lumbar region: Secondary | ICD-10-CM | POA: Diagnosis not present

## 2020-11-24 DIAGNOSIS — M48061 Spinal stenosis, lumbar region without neurogenic claudication: Secondary | ICD-10-CM | POA: Diagnosis not present

## 2020-11-26 DIAGNOSIS — M5416 Radiculopathy, lumbar region: Secondary | ICD-10-CM | POA: Diagnosis not present

## 2020-11-26 DIAGNOSIS — M48061 Spinal stenosis, lumbar region without neurogenic claudication: Secondary | ICD-10-CM | POA: Diagnosis not present

## 2020-11-26 DIAGNOSIS — M4316 Spondylolisthesis, lumbar region: Secondary | ICD-10-CM | POA: Diagnosis not present

## 2020-11-30 DIAGNOSIS — M5416 Radiculopathy, lumbar region: Secondary | ICD-10-CM | POA: Diagnosis not present

## 2020-11-30 DIAGNOSIS — M48061 Spinal stenosis, lumbar region without neurogenic claudication: Secondary | ICD-10-CM | POA: Diagnosis not present

## 2020-11-30 DIAGNOSIS — M4316 Spondylolisthesis, lumbar region: Secondary | ICD-10-CM | POA: Diagnosis not present

## 2021-03-16 DIAGNOSIS — H35372 Puckering of macula, left eye: Secondary | ICD-10-CM | POA: Diagnosis not present

## 2021-04-07 DIAGNOSIS — D1801 Hemangioma of skin and subcutaneous tissue: Secondary | ICD-10-CM | POA: Diagnosis not present

## 2021-04-07 DIAGNOSIS — Z85828 Personal history of other malignant neoplasm of skin: Secondary | ICD-10-CM | POA: Diagnosis not present

## 2021-04-07 DIAGNOSIS — L720 Epidermal cyst: Secondary | ICD-10-CM | POA: Diagnosis not present

## 2021-04-07 DIAGNOSIS — L821 Other seborrheic keratosis: Secondary | ICD-10-CM | POA: Diagnosis not present

## 2021-04-07 DIAGNOSIS — D2272 Melanocytic nevi of left lower limb, including hip: Secondary | ICD-10-CM | POA: Diagnosis not present

## 2021-04-07 DIAGNOSIS — D2271 Melanocytic nevi of right lower limb, including hip: Secondary | ICD-10-CM | POA: Diagnosis not present

## 2021-04-07 DIAGNOSIS — L814 Other melanin hyperpigmentation: Secondary | ICD-10-CM | POA: Diagnosis not present

## 2021-04-07 DIAGNOSIS — L2089 Other atopic dermatitis: Secondary | ICD-10-CM | POA: Diagnosis not present

## 2021-05-11 DIAGNOSIS — Z0000 Encounter for general adult medical examination without abnormal findings: Secondary | ICD-10-CM | POA: Diagnosis not present

## 2021-05-11 DIAGNOSIS — I701 Atherosclerosis of renal artery: Secondary | ICD-10-CM | POA: Diagnosis not present

## 2021-05-11 DIAGNOSIS — K219 Gastro-esophageal reflux disease without esophagitis: Secondary | ICD-10-CM | POA: Diagnosis not present

## 2021-05-11 DIAGNOSIS — F411 Generalized anxiety disorder: Secondary | ICD-10-CM | POA: Diagnosis not present

## 2021-05-11 DIAGNOSIS — E559 Vitamin D deficiency, unspecified: Secondary | ICD-10-CM | POA: Diagnosis not present

## 2021-05-11 DIAGNOSIS — F419 Anxiety disorder, unspecified: Secondary | ICD-10-CM | POA: Diagnosis not present

## 2021-05-11 DIAGNOSIS — D039 Melanoma in situ, unspecified: Secondary | ICD-10-CM | POA: Diagnosis not present

## 2021-05-11 DIAGNOSIS — N3281 Overactive bladder: Secondary | ICD-10-CM | POA: Diagnosis not present

## 2021-05-11 DIAGNOSIS — Z79899 Other long term (current) drug therapy: Secondary | ICD-10-CM | POA: Diagnosis not present

## 2021-05-11 DIAGNOSIS — I10 Essential (primary) hypertension: Secondary | ICD-10-CM | POA: Diagnosis not present

## 2021-06-21 NOTE — Progress Notes (Signed)
71 y.o. G42P2003 Married Caucasian female here for annual exam.    Asking about calcium intake.  Drinks milk and eats yogurt.   Has hemorrhoid.  Using suppositories.  No pain or bleeding.  Does not have constipation.   Occasional incontinence of urine.  No leak with cough, laugh, or sneeze. Wears pad for protection during the day.  Some urgency, notes when she is in the kitchen. Does not occur when she is out of the house.  Occasional coffee. Does not desire treatment for this.   No dysuria.   PCP:  Jonathon Jordan, MD  Patient's last menstrual period was 05/30/2003.           Sexually active: Yes.   sometimes The current method of family planning is post menopausal status.    Exercising: Yes.     Walking, cardio, works out at gym Smoker:  no  Health Maintenance: Pap:  12-10-17 Neg:Neg HR HPV, 08-05-15 Neg, 02-05-14 Neg History of abnormal Pap:  No per patient--no treatment MMG:  07-28-20 [id number] Colonoscopy:  2021 per patient BMD:  07-28-20  Result :Osteopenia TDaP:  2019 Gardasil:   no HIV: donates blood Hep C:donates blood Screening Labs: PCP.   reports that she has never smoked. She has never used smokeless tobacco. She reports current alcohol use. She reports that she does not use drugs.  Past Medical History:  Diagnosis Date   Acid reflux    Anxiety    Diverticulosis    Hiatal hernia    Hypertension    off medication since 9/13   Hypertension    Iron deficiency anemia 03/19/2014   Leukopenia 03/19/2014   Melanoma ([place]) 2018   On back.  Dr. Ubaldo Glassing follows.   Memory change 08/16/2015   Spinal stenosis     Past Surgical History:  Procedure Laterality Date   COLONOSCOPY WITH ESOPHAGOGASTRODUODENOSCOPY (EGD)     RENAL ANGIOGRAPHY Right 02/05/2019   Procedure: RENAL ANGIOGRAPHY;  Surgeon: Marty Heck, MD;  Location: [facility];  Service: Cardiovascular;  Laterality: Right;    Current Outpatient Medications  Medication Sig Dispense Refill    ALPRAZolam (XANAX) 0.25 MG tablet Take 0.25 mg by mouth daily as needed for anxiety.      amLODipine-valsartan (EXFORGE) 5-320 MG tablet TAKE 1 TABLET BY MOUTH EVERY DAY IN THE MORNING     atenolol (TENORMIN) 100 MG tablet Take 100 mg by mouth daily.   7   cholecalciferol (VITAMIN D) 1000 units tablet Take 1,000 Units by mouth 2 (two) times daily.      meloxicam (MOBIC) 15 MG tablet 1 tablet     Meloxicam 15 MG TBDP Take 15 mg by mouth daily as needed (pain.).      pantoprazole (PROTONIX) 20 MG tablet Take 20 mg by mouth daily before breakfast.      tetrahydrozoline 0.05 % ophthalmic solution Place 1-2 drops into both eyes 3 (three) times daily as needed (dry/irritated eyes.).     No current facility-administered medications for this visit.    Family History  Problem Relation Age of Onset   Stroke Mother    Dementia Mother    Lymphoma Brother             Review of Systems  Genitourinary:        Occ. incontinence  All other systems reviewed and are negative.  Exam:   BP 140/64    Pulse 60    Ht 5' 1.5" (1.562 m)    Wt 139  lb (63 kg)    LMP 05/30/2003    SpO2 98%    BMI 25.84 kg/m     General appearance: alert, cooperative and appears stated age Head: normocephalic, without obvious abnormality, atraumatic Neck: no adenopathy, supple, symmetrical, trachea midline and thyroid normal to inspection and palpation Lungs: clear to auscultation bilaterally Breasts: normal appearance, no masses or tenderness, No nipple retraction or dimpling, No nipple discharge or bleeding, No axillary adenopathy Heart: regular rate and rhythm Abdomen: soft, non-tender; no masses, no organomegaly Extremities: extremities normal, atraumatic, no cyanosis or edema Skin: skin color, texture, turgor normal. No rashes or lesions Lymph nodes: cervical, supraclavicular, and axillary nodes normal. Neurologic: grossly normal  Pelvic: External genitalia:  no lesions              No abnormal inguinal nodes  palpated.              Urethra:  normal appearing urethra with no masses, tenderness or lesions              Bartholins and Skenes: normal                 Vagina: normal appearing vagina with normal color and discharge, no lesions              Cervix: no lesions              Pap taken: yes Bimanual Exam:  Uterus:  normal size, contour, position, consistency, mobility, non-tender              Adnexa: no mass, fullness, tenderness              Rectal exam: yes.  Confirms.              Anus:  normal sphincter tone, no lesions  Chaperone was present for exam:  Estill Bamberg, CMA  Assessment:   Well woman visit with gynecologic exam. Osteopenia. Overactive bladder.  Health education encounter.  Plan: Mammogram screening discussed. Self breast awareness reviewed. Pap and HR HPV as above. Guidelines for Calcium, Vitamin D, regular exercise program including cardiovascular and weight bearing exercise. We discussed her BMD, calcium and vit D, and weight bearing exercise.  BMD in 2024.  We reviewed overactive bladder symptoms and option for medication or PT.  Patient prefers observation at this time Follow up in 2 years and prn.  After visit summary provided.   35 min  total time was spent for this patient encounter, including preparation, face-to-face counseling with the patient, coordination of care, and documentation of the encounter.

## 2021-06-23 ENCOUNTER — Encounter: Payer: Self-pay | Admitting: Obstetrics and Gynecology

## 2021-06-23 ENCOUNTER — Ambulatory Visit: Payer: Medicare PPO | Admitting: Obstetrics and Gynecology

## 2021-06-23 ENCOUNTER — Other Ambulatory Visit: Payer: Self-pay

## 2021-06-23 ENCOUNTER — Other Ambulatory Visit (HOSPITAL_COMMUNITY)
Admission: RE | Admit: 2021-06-23 | Discharge: 2021-06-23 | Disposition: A | Discharge status: Home / Self Care | Payer: Medicare PPO | Source: Ambulatory Visit | Attending: Obstetrics and Gynecology | Admitting: Obstetrics and Gynecology

## 2021-06-23 VITALS — BP 140/64 | HR 60 | Ht 61.5 in | Wt 139.0 lb

## 2021-06-23 DIAGNOSIS — M85859 Other specified disorders of bone density and structure, unspecified thigh: Secondary | ICD-10-CM | POA: Diagnosis not present

## 2021-06-23 DIAGNOSIS — Z01419 Encounter for gynecological examination (general) (routine) without abnormal findings: Secondary | ICD-10-CM | POA: Diagnosis not present

## 2021-06-23 DIAGNOSIS — Z124 Encounter for screening for malignant neoplasm of cervix: Secondary | ICD-10-CM

## 2021-06-23 DIAGNOSIS — Z719 Counseling, unspecified: Secondary | ICD-10-CM | POA: Diagnosis not present

## 2021-06-23 NOTE — Patient Instructions (Signed)

## 2021-06-25 LAB — CYTOLOGY - PAP
Diagnosis: NEGATIVE
Diagnosis: REACTIVE

## 2021-06-27 ENCOUNTER — Inpatient Hospital Stay (HOSPITAL_COMMUNITY): Payer: Medicare PPO | Admitting: Certified Registered Nurse Anesthetist

## 2021-06-27 ENCOUNTER — Encounter (HOSPITAL_COMMUNITY): Payer: Self-pay

## 2021-06-27 ENCOUNTER — Other Ambulatory Visit: Payer: Self-pay

## 2021-06-27 ENCOUNTER — Encounter (HOSPITAL_COMMUNITY): Admission: EM | Disposition: A | Discharge status: Home Health | Payer: Self-pay | Source: Home / Self Care

## 2021-06-27 ENCOUNTER — Emergency Department (HOSPITAL_COMMUNITY): Payer: Medicare PPO

## 2021-06-27 ENCOUNTER — Inpatient Hospital Stay (HOSPITAL_COMMUNITY)
Admission: EM | Admit: 2021-06-27 | Discharge: 2021-07-02 | DRG: 331 | Disposition: A | Discharge status: Home Health | Payer: Medicare PPO | Attending: Surgery | Admitting: Surgery

## 2021-06-27 DIAGNOSIS — F419 Anxiety disorder, unspecified: Secondary | ICD-10-CM | POA: Diagnosis present

## 2021-06-27 DIAGNOSIS — Z20822 Contact with and (suspected) exposure to covid-19: Secondary | ICD-10-CM | POA: Diagnosis present

## 2021-06-27 DIAGNOSIS — Z79899 Other long term (current) drug therapy: Secondary | ICD-10-CM

## 2021-06-27 DIAGNOSIS — K5720 Diverticulitis of large intestine with perforation and abscess without bleeding: Principal | ICD-10-CM | POA: Diagnosis present

## 2021-06-27 DIAGNOSIS — K631 Perforation of intestine (nontraumatic): Secondary | ICD-10-CM

## 2021-06-27 DIAGNOSIS — K5730 Diverticulosis of large intestine without perforation or abscess without bleeding: Secondary | ICD-10-CM | POA: Diagnosis not present

## 2021-06-27 DIAGNOSIS — Z882 Allergy status to sulfonamides status: Secondary | ICD-10-CM | POA: Diagnosis not present

## 2021-06-27 DIAGNOSIS — I10 Essential (primary) hypertension: Secondary | ICD-10-CM | POA: Diagnosis not present

## 2021-06-27 DIAGNOSIS — K668 Other specified disorders of peritoneum: Secondary | ICD-10-CM

## 2021-06-27 DIAGNOSIS — R109 Unspecified abdominal pain: Secondary | ICD-10-CM | POA: Diagnosis not present

## 2021-06-27 DIAGNOSIS — K658 Other peritonitis: Secondary | ICD-10-CM | POA: Diagnosis not present

## 2021-06-27 DIAGNOSIS — N83202 Unspecified ovarian cyst, left side: Secondary | ICD-10-CM | POA: Diagnosis not present

## 2021-06-27 DIAGNOSIS — Z85820 Personal history of malignant melanoma of skin: Secondary | ICD-10-CM | POA: Diagnosis not present

## 2021-06-27 DIAGNOSIS — R1084 Generalized abdominal pain: Secondary | ICD-10-CM | POA: Diagnosis not present

## 2021-06-27 DIAGNOSIS — K219 Gastro-esophageal reflux disease without esophagitis: Secondary | ICD-10-CM | POA: Diagnosis present

## 2021-06-27 HISTORY — PX: LAPAROTOMY: SHX154

## 2021-06-27 LAB — RESP PANEL BY RT-PCR (FLU A&B, COVID) ARPGX2
Influenza A by PCR: NEGATIVE
Influenza B by PCR: NEGATIVE
SARS Coronavirus 2 by RT PCR: NEGATIVE

## 2021-06-27 LAB — URINALYSIS, ROUTINE W REFLEX MICROSCOPIC
Bilirubin Urine: NEGATIVE
Glucose, UA: NEGATIVE mg/dL
Hgb urine dipstick: NEGATIVE
Ketones, ur: NEGATIVE mg/dL
Nitrite: NEGATIVE
Protein, ur: 30 mg/dL — AB
Specific Gravity, Urine: 1.023 (ref 1.005–1.030)
pH: 5 (ref 5.0–8.0)

## 2021-06-27 LAB — COMPREHENSIVE METABOLIC PANEL
ALT: 17 U/L (ref 0–44)
AST: 22 U/L (ref 15–41)
Albumin: 3.6 g/dL (ref 3.5–5.0)
Alkaline Phosphatase: 52 U/L (ref 38–126)
Anion gap: 8 (ref 5–15)
BUN: 33 mg/dL — ABNORMAL HIGH (ref 8–23)
CO2: 26 mmol/L (ref 22–32)
Calcium: 8.8 mg/dL — ABNORMAL LOW (ref 8.9–10.3)
Chloride: 101 mmol/L (ref 98–111)
Creatinine, Ser: 1.03 mg/dL — ABNORMAL HIGH (ref 0.44–1.00)
GFR, Estimated: 58 mL/min — ABNORMAL LOW (ref >60)
Glucose, Bld: 123 mg/dL — ABNORMAL HIGH (ref 70–99)
Potassium: 3.5 mmol/L (ref 3.5–5.1)
Sodium: 135 mmol/L (ref 135–145)
Total Bilirubin: 0.9 mg/dL (ref 0.3–1.2)
Total Protein: 6.7 g/dL (ref 6.5–8.1)

## 2021-06-27 LAB — I-STAT CHEM 8, ED
BUN: 32 mg/dL — ABNORMAL HIGH (ref 8–23)
Calcium, Ion: 1.17 mmol/L (ref 1.15–1.40)
Chloride: 100 mmol/L (ref 98–111)
Creatinine, Ser: 1.1 mg/dL — ABNORMAL HIGH (ref 0.44–1.00)
Glucose, Bld: 121 mg/dL — ABNORMAL HIGH (ref 70–99)
HCT: 36 % (ref 36.0–46.0)
Hemoglobin: 12.2 g/dL (ref 12.0–15.0)
Potassium: 3.8 mmol/L (ref 3.5–5.1)
Sodium: 136 mmol/L (ref 135–145)
TCO2: 28 mmol/L (ref 22–32)

## 2021-06-27 LAB — CBC
HCT: 36.4 % (ref 36.0–46.0)
Hemoglobin: 12.6 g/dL (ref 12.0–15.0)
MCH: 32.2 pg (ref 26.0–34.0)
MCHC: 34.6 g/dL (ref 30.0–36.0)
MCV: 93.1 fL (ref 80.0–100.0)
Platelets: 217 10*3/uL (ref 150–400)
RBC: 3.91 MIL/uL (ref 3.87–5.11)
RDW: 12.6 % (ref 11.5–15.5)
WBC: 8.1 10*3/uL (ref 4.0–10.5)
nRBC: 0 % (ref 0.0–0.2)

## 2021-06-27 LAB — LIPASE, BLOOD: Lipase: 28 U/L (ref 11–51)

## 2021-06-27 LAB — LACTIC ACID, PLASMA
Lactic Acid, Venous: 2.4 mmol/L (ref 0.5–1.9)
Lactic Acid, Venous: 1.6 mmol/L (ref 0.5–1.9)

## 2021-06-27 SURGERY — LAPAROTOMY, EXPLORATORY
Anesthesia: General | Site: Abdomen

## 2021-06-27 MED ORDER — AMLODIPINE BESYLATE-VALSARTAN 5-320 MG PO TABS: 1.0000 | ORAL_TABLET | Freq: Every day | ORAL | Status: DC

## 2021-06-27 MED ORDER — KCL IN DEXTROSE-NACL 20-5-0.9 MEQ/L-%-% IV SOLN
INTRAVENOUS | Status: AC
  Administered 2021-06-28: 1000 mL
  Filled 2021-06-27: qty 1000

## 2021-06-27 MED ORDER — PIPERACILLIN-TAZOBACTAM 3.375 G IVPB 30 MIN
3.3750 g | Freq: Once | INTRAVENOUS | Status: AC
  Administered 2021-06-27: 3.375 g via INTRAVENOUS
  Filled 2021-06-27: qty 50

## 2021-06-27 MED ORDER — PANTOPRAZOLE SODIUM 40 MG IV SOLR
40.0000 mg | Freq: Every day | INTRAVENOUS | Status: DC
  Administered 2021-06-27 – 2021-06-30 (×4): 40 mg via INTRAVENOUS
  Filled 2021-06-27 (×4): qty 40

## 2021-06-27 MED ORDER — PROPOFOL 10 MG/ML IV BOLUS
INTRAVENOUS | Status: AC
  Filled 2021-06-27: qty 20

## 2021-06-27 MED ORDER — FENTANYL CITRATE (PF) 100 MCG/2ML IJ SOLN
INTRAMUSCULAR | Status: AC
  Filled 2021-06-27: qty 2

## 2021-06-27 MED ORDER — HYDROMORPHONE HCL 1 MG/ML IJ SOLN: 0.2500 mg | INTRAMUSCULAR | Status: DC | PRN

## 2021-06-27 MED ORDER — METHOCARBAMOL 1000 MG/10ML IJ SOLN
500.0000 mg | Freq: Four times a day (QID) | INTRAVENOUS | Status: DC | PRN
  Administered 2021-06-28: 500 mg via INTRAVENOUS
  Filled 2021-06-27: qty 500
  Filled 2021-06-27: qty 5

## 2021-06-27 MED ORDER — OXYCODONE HCL 5 MG/5ML PO SOLN: 5.0000 mg | Freq: Once | ORAL | Status: DC | PRN

## 2021-06-27 MED ORDER — ENOXAPARIN SODIUM 40 MG/0.4ML IJ SOSY: 40.0000 mg | PREFILLED_SYRINGE | INTRAMUSCULAR | Status: DC

## 2021-06-27 MED ORDER — SODIUM CHLORIDE 0.9 % IR SOLN
Status: DC | PRN
  Administered 2021-06-27: 5000 mL

## 2021-06-27 MED ORDER — LACTATED RINGERS IV SOLN: INTRAVENOUS | Status: DC

## 2021-06-27 MED ORDER — OXYCODONE HCL 5 MG PO TABS: 5.0000 mg | ORAL_TABLET | Freq: Once | ORAL | Status: DC | PRN

## 2021-06-27 MED ORDER — ONDANSETRON HCL 4 MG/2ML IJ SOLN
INTRAMUSCULAR | Status: DC | PRN
  Administered 2021-06-27 (×2): 4 mg via INTRAVENOUS

## 2021-06-27 MED ORDER — OXYCODONE HCL 5 MG PO TABS: 5.0000 mg | ORAL_TABLET | ORAL | Status: DC | PRN

## 2021-06-27 MED ORDER — DEXAMETHASONE SODIUM PHOSPHATE 10 MG/ML IJ SOLN
INTRAMUSCULAR | Status: DC | PRN
  Administered 2021-06-27: 4 mg via INTRAVENOUS

## 2021-06-27 MED ORDER — ATENOLOL 50 MG PO TABS
100.0000 mg | ORAL_TABLET | Freq: Every day | ORAL | Status: DC
  Administered 2021-06-27 – 2021-07-01 (×4): 100 mg via ORAL
  Filled 2021-06-27 (×4): qty 2

## 2021-06-27 MED ORDER — ROCURONIUM BROMIDE 10 MG/ML (PF) SYRINGE
PREFILLED_SYRINGE | INTRAVENOUS | Status: AC
  Filled 2021-06-27: qty 10

## 2021-06-27 MED ORDER — PHENYLEPHRINE 40 MCG/ML (10ML) SYRINGE FOR IV PUSH (FOR BLOOD PRESSURE SUPPORT)
PREFILLED_SYRINGE | INTRAVENOUS | Status: DC | PRN
  Administered 2021-06-27: 120 ug via INTRAVENOUS
  Administered 2021-06-27 (×2): 80 ug via INTRAVENOUS
  Administered 2021-06-27: 120 ug via INTRAVENOUS

## 2021-06-27 MED ORDER — AMLODIPINE BESYLATE 5 MG PO TABS
5.0000 mg | ORAL_TABLET | Freq: Every day | ORAL | Status: DC
  Administered 2021-06-29 – 2021-07-02 (×4): 5 mg via ORAL
  Filled 2021-06-27 (×5): qty 1

## 2021-06-27 MED ORDER — ONDANSETRON 4 MG PO TBDP: 4.0000 mg | ORAL_TABLET | Freq: Four times a day (QID) | ORAL | Status: DC | PRN

## 2021-06-27 MED ORDER — LIDOCAINE HCL (PF) 2 % IJ SOLN
INTRAMUSCULAR | Status: AC
  Filled 2021-06-27: qty 5

## 2021-06-27 MED ORDER — MORPHINE SULFATE (PF) 4 MG/ML IV SOLN
4.0000 mg | Freq: Once | INTRAVENOUS | Status: AC
  Administered 2021-06-27: 4 mg via INTRAVENOUS
  Filled 2021-06-27: qty 1

## 2021-06-27 MED ORDER — SODIUM CHLORIDE 0.9 % IV SOLN: INTRAVENOUS | Status: DC

## 2021-06-27 MED ORDER — ACETAMINOPHEN 650 MG RE SUPP: 650.0000 mg | Freq: Four times a day (QID) | RECTAL | Status: DC | PRN

## 2021-06-27 MED ORDER — HYDRALAZINE HCL 20 MG/ML IJ SOLN: 10.0000 mg | INTRAMUSCULAR | Status: DC | PRN

## 2021-06-27 MED ORDER — HYDROMORPHONE HCL 2 MG/ML IJ SOLN
INTRAMUSCULAR | Status: AC
  Filled 2021-06-27: qty 1

## 2021-06-27 MED ORDER — ACETAMINOPHEN 325 MG PO TABS: 650.0000 mg | ORAL_TABLET | Freq: Four times a day (QID) | ORAL | Status: DC | PRN

## 2021-06-27 MED ORDER — ONDANSETRON HCL 4 MG/2ML IJ SOLN: 4.0000 mg | Freq: Four times a day (QID) | INTRAMUSCULAR | Status: DC | PRN

## 2021-06-27 MED ORDER — DIPHENHYDRAMINE HCL 50 MG/ML IJ SOLN: 12.5000 mg | Freq: Four times a day (QID) | INTRAMUSCULAR | Status: DC | PRN

## 2021-06-27 MED ORDER — MORPHINE SULFATE (PF) 2 MG/ML IV SOLN: 2.0000 mg | INTRAVENOUS | Status: DC | PRN

## 2021-06-27 MED ORDER — DEXAMETHASONE SODIUM PHOSPHATE 10 MG/ML IJ SOLN
INTRAMUSCULAR | Status: AC
  Filled 2021-06-27: qty 1

## 2021-06-27 MED ORDER — BUPIVACAINE-EPINEPHRINE (PF) 0.25% -1:200000 IJ SOLN
INTRAMUSCULAR | Status: AC
  Filled 2021-06-27: qty 30

## 2021-06-27 MED ORDER — IOHEXOL 300 MG/ML  SOLN
100.0000 mL | Freq: Once | INTRAMUSCULAR | Status: AC | PRN
  Administered 2021-06-27: 100 mL via INTRAVENOUS

## 2021-06-27 MED ORDER — BUPIVACAINE LIPOSOME 1.3 % IJ SUSP
INTRAMUSCULAR | Status: AC
  Filled 2021-06-27: qty 20

## 2021-06-27 MED ORDER — SODIUM CHLORIDE 0.9 % IV BOLUS
1000.0000 mL | Freq: Once | INTRAVENOUS | Status: AC
  Administered 2021-06-27: 1000 mL via INTRAVENOUS

## 2021-06-27 MED ORDER — PHENYLEPHRINE HCL-NACL 20-0.9 MG/250ML-% IV SOLN
INTRAVENOUS | Status: DC | PRN
  Administered 2021-06-27: 80 ug/min via INTRAVENOUS

## 2021-06-27 MED ORDER — ALPRAZOLAM 0.25 MG PO TABS: 0.2500 mg | ORAL_TABLET | Freq: Every day | ORAL | Status: DC | PRN

## 2021-06-27 MED ORDER — IRBESARTAN 150 MG PO TABS
300.0000 mg | ORAL_TABLET | Freq: Every day | ORAL | Status: DC
  Administered 2021-06-29 – 2021-07-02 (×4): 300 mg via ORAL
  Filled 2021-06-27 (×5): qty 2

## 2021-06-27 MED ORDER — LIDOCAINE 2% (20 MG/ML) 5 ML SYRINGE
INTRAMUSCULAR | Status: DC | PRN
  Administered 2021-06-27: 100 mg via INTRAVENOUS

## 2021-06-27 MED ORDER — HYDROMORPHONE HCL 1 MG/ML IJ SOLN
INTRAMUSCULAR | Status: DC | PRN
  Administered 2021-06-27: .5 mg via INTRAVENOUS

## 2021-06-27 MED ORDER — MORPHINE SULFATE (PF) 2 MG/ML IV SOLN: 1.0000 mg | INTRAVENOUS | Status: DC | PRN

## 2021-06-27 MED ORDER — SUGAMMADEX SODIUM 200 MG/2ML IV SOLN
INTRAVENOUS | Status: DC | PRN
  Administered 2021-06-27: 225 mg via INTRAVENOUS

## 2021-06-27 MED ORDER — ONDANSETRON HCL 4 MG/2ML IJ SOLN
INTRAMUSCULAR | Status: AC
  Filled 2021-06-27: qty 2

## 2021-06-27 MED ORDER — ONDANSETRON HCL 4 MG/2ML IJ SOLN: 4.0000 mg | Freq: Once | INTRAMUSCULAR | Status: DC | PRN

## 2021-06-27 MED ORDER — METHOCARBAMOL 1000 MG/10ML IJ SOLN
500.0000 mg | Freq: Four times a day (QID) | INTRAVENOUS | Status: DC | PRN
  Filled 2021-06-27: qty 5

## 2021-06-27 MED ORDER — PROPOFOL 10 MG/ML IV BOLUS
INTRAVENOUS | Status: DC | PRN
  Administered 2021-06-27: 100 mg via INTRAVENOUS

## 2021-06-27 MED ORDER — DIPHENHYDRAMINE HCL 12.5 MG/5ML PO ELIX: 12.5000 mg | ORAL_SOLUTION | Freq: Four times a day (QID) | ORAL | Status: DC | PRN

## 2021-06-27 MED ORDER — FENTANYL CITRATE (PF) 100 MCG/2ML IJ SOLN
INTRAMUSCULAR | Status: DC | PRN
  Administered 2021-06-27: 25 ug via INTRAVENOUS
  Administered 2021-06-27: 50 ug via INTRAVENOUS
  Administered 2021-06-27: 25 ug via INTRAVENOUS

## 2021-06-27 MED ORDER — HEPARIN SODIUM (PORCINE) 5000 UNIT/ML IJ SOLN
5000.0000 [IU] | Freq: Three times a day (TID) | INTRAMUSCULAR | Status: DC
  Administered 2021-06-28 – 2021-07-02 (×13): 5000 [IU] via SUBCUTANEOUS
  Filled 2021-06-27 (×13): qty 1

## 2021-06-27 MED ORDER — ROCURONIUM BROMIDE 10 MG/ML (PF) SYRINGE
PREFILLED_SYRINGE | INTRAVENOUS | Status: DC | PRN
  Administered 2021-06-27: 60 mg via INTRAVENOUS

## 2021-06-27 MED ORDER — MELATONIN 3 MG PO TABS
3.0000 mg | ORAL_TABLET | Freq: Every evening | ORAL | Status: DC | PRN
  Administered 2021-06-30: 3 mg via ORAL
  Filled 2021-06-27: qty 1

## 2021-06-27 MED ORDER — CHLORHEXIDINE GLUCONATE 0.12% ORAL RINSE (MEDLINE KIT)
15.0000 mL | Freq: Once | OROMUCOSAL | Status: AC
  Administered 2021-06-27: 15 mL via OROMUCOSAL

## 2021-06-27 MED ORDER — ONDANSETRON HCL 4 MG/2ML IJ SOLN
4.0000 mg | Freq: Once | INTRAMUSCULAR | Status: AC
  Administered 2021-06-27: 4 mg via INTRAVENOUS
  Filled 2021-06-27: qty 2

## 2021-06-27 MED ORDER — KETOROLAC TROMETHAMINE 30 MG/ML IJ SOLN: 30.0000 mg | Freq: Once | INTRAMUSCULAR | Status: DC | PRN

## 2021-06-27 MED ORDER — PIPERACILLIN-TAZOBACTAM 3.375 G IVPB
3.3750 g | Freq: Three times a day (TID) | INTRAVENOUS | Status: DC
  Administered 2021-06-27 – 2021-07-02 (×14): 3.375 g via INTRAVENOUS
  Filled 2021-06-27 (×13): qty 50

## 2021-06-27 MED ORDER — KCL IN DEXTROSE-NACL 20-5-0.9 MEQ/L-%-% IV SOLN
INTRAVENOUS | Status: DC
  Filled 2021-06-27 (×5): qty 1000

## 2021-06-27 SURGICAL SUPPLY — 45 items
APPLICATOR COTTON TIP 6 STRL (MISCELLANEOUS) ×1 IMPLANT
APPLICATOR COTTON TIP 6IN STRL (MISCELLANEOUS) IMPLANT
BAG COUNTER SPONGE SURGICOUNT (BAG) IMPLANT
BLADE EXTENDED COATED 6.5IN (ELECTRODE) ×1 IMPLANT
BLADE HEX COATED 2.75 (ELECTRODE) ×2 IMPLANT
BNDG GAUZE ELAST 4 BULKY (GAUZE/BANDAGES/DRESSINGS) ×1 IMPLANT
COVER MAYO STAND STRL (DRAPES) IMPLANT
DRAPE LAPAROSCOPIC ABDOMINAL (DRAPES) ×2 IMPLANT
DRAPE WARM FLUID 44X44 (DRAPES) ×1 IMPLANT
ELECT REM PT RETURN 15FT ADLT (MISCELLANEOUS) ×2 IMPLANT
GAUZE SPONGE 4X4 12PLY STRL (GAUZE/BANDAGES/DRESSINGS) ×1 IMPLANT
GLOVE SURG ENC MOIS LTX SZ7.5 (GLOVE) ×3 IMPLANT
GLOVE SURG UNDER POLY LF SZ6.5 (GLOVE) ×2 IMPLANT
GLOVE SURG UNDER POLY LF SZ7 (GLOVE) ×3 IMPLANT
GOWN STRL REUS W/ TWL XL LVL3 (GOWN DISPOSABLE) ×1 IMPLANT
GOWN STRL REUS W/TWL LRG LVL3 (GOWN DISPOSABLE) ×2 IMPLANT
GOWN STRL REUS W/TWL XL LVL3 (GOWN DISPOSABLE) ×2 IMPLANT
HANDLE SUCTION POOLE (INSTRUMENTS) IMPLANT
KIT BASIN OR (CUSTOM PROCEDURE TRAY) ×2 IMPLANT
KIT TURNOVER KIT A (KITS) ×1 IMPLANT
LIGASURE IMPACT 36 18CM CVD LR (INSTRUMENTS) ×1 IMPLANT
NS IRRIG 1000ML POUR BTL (IV SOLUTION) ×6 IMPLANT
PACK GENERAL/GYN (CUSTOM PROCEDURE TRAY) ×2 IMPLANT
RELOAD GRN CONTOUR (ENDOMECHANICALS) ×2 IMPLANT
RELOAD STAPLE 40 GRN THCK (ENDOMECHANICALS) IMPLANT
SPONGE T-LAP 18X18 ~~LOC~~+RFID (SPONGE) ×2 IMPLANT
STAPLER CVD CUT GN 40 RELOAD (ENDOMECHANICALS) ×2 IMPLANT
STAPLER CVD CUT GRN 40 RELOAD (ENDOMECHANICALS) IMPLANT
STAPLER VISISTAT 35W (STAPLE) ×1 IMPLANT
SUCTION POOLE HANDLE (INSTRUMENTS) ×2
SUT PDS AB 1 CTX 36 (SUTURE) IMPLANT
SUT PDS AB 1 TP1 96 (SUTURE) ×2 IMPLANT
SUT PROLENE 2 0 SH DA (SUTURE) ×1 IMPLANT
SUT SILK 2 0 (SUTURE) ×2
SUT SILK 2 0 SH CR/8 (SUTURE) ×1 IMPLANT
SUT SILK 2-0 18XBRD TIE 12 (SUTURE) IMPLANT
SUT SILK 3 0 (SUTURE) ×2
SUT SILK 3 0 SH CR/8 (SUTURE) IMPLANT
SUT SILK 3-0 18XBRD TIE 12 (SUTURE) IMPLANT
SUT VIC AB 2-0 SH 18 (SUTURE) ×2 IMPLANT
TOWEL OR 17X26 10 PK STRL BLUE (TOWEL DISPOSABLE) ×4 IMPLANT
TRAY FOLEY MTR SLVR 14FR STAT (SET/KITS/TRAYS/PACK) ×1 IMPLANT
TRAY FOLEY MTR SLVR 16FR STAT (SET/KITS/TRAYS/PACK) ×1 IMPLANT
WATER STERILE IRR 1000ML POUR (IV SOLUTION) ×1 IMPLANT
YANKAUER SUCT BULB TIP NO VENT (SUCTIONS) ×1 IMPLANT

## 2021-06-27 NOTE — Anesthesia Procedure Notes (Signed)
Date/Time: 06/27/2021 7:37 PM Performed by: Cynda Familia, CRNA Oxygen Delivery Method: Simple face mask Placement Confirmation: positive ETCO2 and breath sounds checked- equal and bilateral

## 2021-06-27 NOTE — ED Provider Notes (Signed)
[facility] DEPT Provider Note   CSN: [id number] Arrival date & time: 06/27/21  1205     History  Chief Complaint  Patient presents with   Abdominal Pain    Carla Ramirez is a 71 y.o. female.  71 yo F with a chief complaints of abdominal discomfort.  Started last night after she had had some trail mix with some nuts in it.  Pain from the top of the abdomen to the bottom.  Mostly in the middle.  Has been pretty consistent since then.  No fevers no vomiting no diarrhea.  She saw her family doctor's office today and they told her to come to the ER for evaluation.  Denies prior abdominal surgery.   Abdominal Pain     Home Medications Prior to Admission medications   Medication Sig Start Date End Date Taking? Authorizing Provider  ALPRAZolam Duanne Moron) 0.25 MG tablet Take 0.25 mg by mouth daily as needed for anxiety.  11/04/12  Yes [provider]  amLODipine-valsartan (EXFORGE) 5-320 MG tablet Take 1 tablet by mouth daily.   Yes [provider]  atenolol (TENORMIN) 100 MG tablet Take 100 mg by mouth daily.  11/28/17  Yes [provider]  Calcium-Vitamin D-Vitamin K (VIACTIV CALCIUM PLUS D) 650-12.5-40 MG-MCG-MCG CHEW Chew 1 tablet by mouth 2 (two) times daily.   Yes [provider]  cholecalciferol (VITAMIN D) 1000 units tablet Take 1,000 Units by mouth daily.   Yes [provider]  ibuprofen (ADVIL) 200 MG tablet Take 200 mg by mouth every 6 (six) hours as needed for mild pain or headache.   Yes [provider]  meloxicam (MOBIC) 15 MG tablet Take 15 mg by mouth daily.   Yes [provider]  pantoprazole (PROTONIX) 20 MG tablet Take 20 mg by mouth daily before breakfast.    Yes [provider]  Propylene Glycol (SYSTANE BALANCE) 0.6 % SOLN Place 1 drop into both eyes daily as needed (dry eyes).   Yes [provider]      Allergies    Sulfa antibiotics    Review of Systems    Review of Systems  Gastrointestinal:  Positive for abdominal pain.   Physical Exam Updated Vital Signs BP 116/64    Pulse 71    Temp 98.3 F (36.8 C) (Oral)    Resp 18    Ht 5' 1.5" (1.562 m)    Wt 63 kg    LMP 05/30/2003    SpO2 100%    BMI 25.84 kg/m  Physical Exam Vitals and nursing note reviewed.  Constitutional:      General: She is not in acute distress.    Appearance: She is well-developed. She is not diaphoretic.  HENT:     Head: Normocephalic and atraumatic.  Eyes:     Pupils: Pupils are equal, round, and reactive to light.  Cardiovascular:     Rate and Rhythm: Normal rate and regular rhythm.     Heart sounds: No murmur heard.   No friction rub. No gallop.  Pulmonary:     Effort: Pulmonary effort is normal.     Breath sounds: No wheezing or rales.  Abdominal:     General: There is no distension.     Palpations: Abdomen is soft.     Tenderness: There is abdominal tenderness.     Comments: Diffuse abdominal tenderness with guarding.  No focality.  Musculoskeletal:        General: No tenderness.  Cervical back: Normal range of motion and neck supple.  Skin:    General: Skin is warm and dry.  Neurological:     Mental Status: She is alert and oriented to person, place, and time.  Psychiatric:        Behavior: Behavior normal.    ED Results / Procedures / Treatments   Labs (all labs ordered are listed, but only abnormal results are displayed) Labs Reviewed  COMPREHENSIVE METABOLIC PANEL - Abnormal; Notable for the following components:      Result Value   Glucose, Bld 123 (*)    BUN 33 (*)    Creatinine, Ser 1.03 (*)    Calcium 8.8 (*)    GFR, Estimated 58 (*)    All other components within normal limits  URINALYSIS, ROUTINE W REFLEX MICROSCOPIC - Abnormal; Notable for the following components:   Color, Urine AMBER (*)    APPearance HAZY (*)    Protein, ur 30 (*)    Leukocytes,Ua SMALL (*)    Bacteria, UA FEW (*)    All other components within normal  limits  LACTIC ACID, PLASMA - Abnormal; Notable for the following components:   Lactic Acid, Venous 2.4 (*)    All other components within normal limits  I-STAT CHEM 8, ED - Abnormal; Notable for the following components:   BUN 32 (*)    Creatinine, Ser 1.10 (*)    Glucose, Bld 121 (*)    All other components within normal limits  RESP PANEL BY RT-PCR (FLU A&B, COVID) ARPGX2  LIPASE, BLOOD  CBC  LACTIC ACID, PLASMA    EKG None  Radiology CT ABDOMEN PELVIS W CONTRAST  Result Date: 06/27/2021 CLINICAL DATA:  Acute, non localized abdominal pain since last night. Some relief with Pepto-Bismol. EXAM: CT ABDOMEN AND PELVIS WITH CONTRAST TECHNIQUE: Multidetector CT imaging of the abdomen and pelvis was performed using the standard protocol following bolus administration of intravenous contrast. RADIATION DOSE REDUCTION: This exam was performed according to the departmental dose-optimization program which includes automated exposure control, adjustment of the mA and/or kV according to patient size and/or use of iterative reconstruction technique. CONTRAST:  116mL OMNIPAQUE IOHEXOL 300 MG/ML  SOLN COMPARISON:  Complete abdomen ultrasound dated 07/20/2014 FINDINGS: Lower chest: Enlarged heart. Mild right and minimal left dependent atelectasis. Hepatobiliary: Small liver cyst. Pancreas: Unremarkable. No pancreatic ductal dilatation or surrounding inflammatory changes. Spleen: Normal in size without focal abnormality. Adrenals/Urinary Tract: Adrenal glands are unremarkable. Kidneys are normal, without renal calculi, focal lesion, or hydronephrosis. Bladder is unremarkable. Stomach/Bowel: Multiple normal caliber small bowel loops with diffuse wall thickening and adjacent soft tissue stranding and edema. No pneumatosis is seen. Multiple colonic diverticula without evidence of diverticulitis. Multiple high density particles in the stool compatible with the history of taking Pepto-Bismol. Unremarkable stomach.   The appendix is not visualized. Vascular/Lymphatic: Atheromatous arterial calcifications without aneurysm. No enlarged lymph nodes. Reproductive: 2.1 cm simple appearing left ovarian cyst. Unremarkable uterus and right adnexa. Other: Moderate amount of free peritoneal air. Small amount of free peritoneal fluid. Musculoskeletal: Lumbar and lower thoracic spine degenerative changes. IMPRESSION: 1. Moderate amount of free peritoneal air, compatible with bowel perforation. 2. Multiple small bowel loops with diffuse wall thickening and adjacent soft tissue edema. This is most likely due to infectious enteritis. This does not have the typical distribution of ischemic changes. 3. Extensive colonic diverticulosis with no localized findings suspicious for diverticulitis. 4. Cardiomegaly. 5. 2.1 cm simple appearing left ovarian cyst. No follow-up imaging recommended.  Note: This recommendation does not apply to those with increased risk (genetic, family history, elevated tumor markers or other high-risk factors) of ovarian cancer. Reference: JACR 2020 Feb; 17(2):248-254 Critical Value/emergent results were called by telephone at the time of interpretation on 06/27/2021 at 2:41 pm to provider Redith Drach , who verbally acknowledged these results. Electronically Signed   By: Claudie Revering M.D.   On: 06/27/2021 14:48    Procedures Procedures    Medications Ordered in ED Medications  piperacillin-tazobactam (ZOSYN) IVPB 3.375 g (3.375 g Intravenous New Bag/Given 06/27/21 1501)  morphine 4 MG/ML injection 4 mg (4 mg Intravenous Given 06/27/21 1315)  ondansetron (ZOFRAN) injection 4 mg (4 mg Intravenous Given 06/27/21 1314)  sodium chloride 0.9 % bolus 1,000 mL (1,000 mLs Intravenous New Bag/Given 06/27/21 1318)  iohexol (OMNIPAQUE) 300 MG/ML solution 100 mL (100 mLs Intravenous Contrast Given 06/27/21 1401)    ED Course/ Medical Decision Making/ A&P                           Medical Decision Making Amount and/or  Complexity of Data Reviewed Labs: ordered. Radiology: ordered.  Risk Prescription drug management. Decision regarding hospitalization.   Patient is a 71 y.o. female with a cc of abdominal discomfort.  Started yesterday after she was having some trail mix.  Diffuse abdominal discomfort with guarding on my exam.  Will obtain a laboratory evaluation and CT scan of the abdomen pelvis with contrast treat pain and nausea.  Patient brought in the note from the PCPs office today.  No obvious specific concern.   I reviewed her chart and she has a significant PMH of colon polyps.  Renal artery stenosis diagnosed on angiogram..  I independently interpreted the patients labs and imaging lactic acid mildly elevated.  No significant electrolyte abnormality LFTs and lipase are unremarkable.  No leukocytosis.  I received a call from the radiologist.  He is concerned that the patient has perforated bowel.  He thinks secondary to an enteritis.  Will start on Zosyn.  Will discuss with general surgery.  They will see at bedside.  Admit.   The patients results and plan were reviewed and discussed.   Any x-rays performed were independently reviewed by myself.   Differential diagnosis were considered with the presenting HPI.  Medications  piperacillin-tazobactam (ZOSYN) IVPB 3.375 g (3.375 g Intravenous New Bag/Given 06/27/21 1501)  morphine 4 MG/ML injection 4 mg (4 mg Intravenous Given 06/27/21 1315)  ondansetron (ZOFRAN) injection 4 mg (4 mg Intravenous Given 06/27/21 1314)  sodium chloride 0.9 % bolus 1,000 mL (1,000 mLs Intravenous New Bag/Given 06/27/21 1318)  iohexol (OMNIPAQUE) 300 MG/ML solution 100 mL (100 mLs Intravenous Contrast Given 06/27/21 1401)    Vitals:   06/27/21 1213 06/27/21 1234 06/27/21 1300 06/27/21 1455  BP: (!) 117/52 (!) 113/48 129/60 116/64  Pulse: 65 61 70 71  Resp: 16 18 16 18   Temp: 97.9 F (36.6 C) 98.3 F (36.8 C)    TempSrc: Oral Oral    SpO2: 98% 99% 97% 100%  Weight:  63  kg    Height:  5' 1.5" (1.562 m)      Final diagnoses:  Bowel perforation [facility])    Admission/ observation were discussed with the admitting physician, patient and/or family and they are comfortable with the plan.         Final Clinical Impression(s) / ED Diagnoses Final diagnoses:  Bowel perforation ([place])    Rx / DC  Orders ED Discharge Orders     None         Deno Etienne, [place] 06/27/21 1502

## 2021-06-27 NOTE — ED Notes (Signed)
Pt received ~500cc of 1L bolus at time second lactic drawn

## 2021-06-27 NOTE — ED Triage Notes (Signed)
C/o generalized abdominal pain that started last night about 2 hours after eating trail mix.  Pt reports taking Pepto bismol with some relief.   Today patient went to [facility] physicians and was told her BP was 80s/40's  Denies N/V   Last BM yesterday.    Allergy to sulfa.

## 2021-06-27 NOTE — Anesthesia Procedure Notes (Signed)
Procedure Name: Intubation Date/Time: 06/27/2021 6:03 PM Performed by: Barnet Glasgow, MD Pre-anesthesia Checklist: Patient identified, Emergency Drugs available, Suction available and Patient being monitored Patient Re-evaluated:Patient Re-evaluated prior to induction Oxygen Delivery Method: Circle system utilized Preoxygenation: Pre-oxygenation with 100% oxygen Induction Type: IV induction Ventilation: Mask ventilation without difficulty Laryngoscope Size: Miller and 2 Grade View: Grade II Tube type: Oral Tube size: 7.0 mm Number of attempts: 1 Airway Equipment and Method: Stylet Placement Confirmation: ETT inserted through vocal cords under direct vision, positive ETCO2 and breath sounds checked- equal and bilateral Secured at: 21 cm Tube secured with: Tape Dental Injury: Teeth and Oropharynx as per pre-operative assessment

## 2021-06-27 NOTE — Op Note (Signed)
06/27/2021  7:32 PM  PATIENT:  Carla Ramirez  71 y.o. female  PRE-OPERATIVE DIAGNOSIS:  FREE AIR  POST-OPERATIVE DIAGNOSIS:  PERFORATED SIGMOID COLON, DIVERTICULITIS  PROCEDURE:  Procedure(s): EXPLORATORY LAPAROTOMY, SIGMOID COLECTOMY WITH COLOSTOMY (N/A)  SURGEON:  Surgeon(s) and Role:    * Jovita Kussmaul, MD - Primary  PHYSICIAN ASSISTANT:   ASSISTANTS: none   ANESTHESIA:   general  EBL:  30 mL   BLOOD ADMINISTERED:none  DRAINS: none   LOCAL MEDICATIONS USED:  NONE  SPECIMEN:  Source of Specimen:  sigmoid colon  DISPOSITION OF SPECIMEN:  PATHOLOGY  COUNTS:  YES  TOURNIQUET:  * No tourniquets in log *  DICTATION: .Dragon Dictation  After informed consent was obtained the patient was brought to the operating room and placed in the supine position on the operating table.  After adequate induction of general anesthesia the patient's abdomen was prepped with ChloraPrep, allowed to dry, and draped in usual sterile manner.  An appropriate timeout was performed.  A lower midline incision was then made with a 10 blade knife.  The incision was carried through the skin and subcutaneous tissue sharply with the electrocautery until the linea alba was identified.  The linea alba was incised with the electrocautery.  The preperitoneal space was probed bluntly with a hemostat until the peritoneum was opened and access was gained to the abdominal cavity.  The rest of the incision was then opened under direct vision.  The abdomen was generally inspected.  There was significant amount of pus evacuated.  There was generalized peritonitis.  The source of the infection appeared to be a perforated diverticulum of the sigmoid colon.  The left and sigmoid colon was mobilized initially by incising its retroperitoneal attachment along the white line of Toldt.  This allowed the sigmoid colon and rectum to be mobilized up high into the abdominal cavity.  I was able to identify the diverticulum with the  perforation.  I chose a site below this is near the rectosigmoid junction for division of the rectosigmoid.  The mesentery at this point was opened sharply with the electrocautery.  A green load contour stapler was placed across the rectosigmoid at this point, clamped, and fired thereby dividing the rectosigmoid between staple lines.  Another site was chosen above the area of the perforation where the colon appeared healthy and normal.  The mesentery at this point was opened sharply with the electrocautery.  Another firing of a contour green load stapler was used to divide the colon at this point.  The mesentery to the colon causing the problem was then taken down sharply with the LigaSure.  A small bleeding vessel from the mesentery was controlled with a 2-0 silk figure-of-eight stitch.  The staple line of the rectal stump was marked with a 2-0 Prolene stitch.  The offending segment of sigmoid colon was then removed from the patient.  It was marked with a stitch on the proximal staple line and sent to pathology for further evaluation.  Hemostasis was achieved using the Bovie electrocautery.  The abdomen was then irrigated with copious amounts of saline until the effluent was clear.  The small bowel was run from the ligament of Treitz to the ileocecal valve and no other abnormalities were noted.  The rest of the abdomen in all 4 quadrants was examined and again no other abnormalities were noted.  There was some omental adhesion to the right lower quadrant from a previous appendectomy.  At this point a site  was chosen on the left mid abdomen to bring out the ostomy.  A small circle of skin and core of fatty tissue was removed sharply with the electrocautery.  Vertically oriented incision was made in the fascia of the abdominal wall at this point so that 2 fingers could come through the opening.  A Babcock was placed through the opening and used to grasp the staple line of the descending colon.  This was able to be  brought up through the abdominal wall without difficulty.  There was no tension.  At this point the fascia the anterior abdominal wall was closed with 2 running #1 double-stranded looped PDS sutures.  The staple line of the ostomy was then removed sharply with the electrocautery and the ostomy was matured with interrupted 3-0 Vicryl stitches.  An ostomy appliance was applied.  The midline wound was then packed with a moistened Kerlix.  Sterile dressings were applied.  The patient tolerated the procedure well.  At the end of the case all needle sponge and instrument counts were correct.  The patient was then awakened and taken to recovery in stable condition.  PLAN OF CARE: Admit to inpatient   PATIENT DISPOSITION:  PACU - hemodynamically stable.   Delay start of Pharmacological VTE agent (>24hrs) due to surgical blood loss or risk of bleeding: no

## 2021-06-27 NOTE — ED Notes (Signed)
Patient transported to CT 

## 2021-06-27 NOTE — ED Notes (Signed)
Single set of blood cultures drawn and sent to lab

## 2021-06-27 NOTE — ED Notes (Signed)
Pt NAD, a/ox4. C/o general ABD pain since last noc. Denies n/v/d. ABD soft, tender

## 2021-06-27 NOTE — Anesthesia Preprocedure Evaluation (Addendum)
Anesthesia Evaluation  Patient identified by MRN, date of birth, ID band Patient awake    Reviewed: Allergy & Precautions, NPO status , Patient's Chart, lab work & pertinent test results  Airway Mallampati: II  TM Distance: >3 FB Neck ROM: Full    Dental no notable dental hx. (+) Dental Advisory Given, Teeth Intact   Pulmonary neg pulmonary ROS,    Pulmonary exam normal breath sounds clear to auscultation       Cardiovascular hypertension, Pt. on medications Normal cardiovascular exam Rhythm:Regular Rate:Normal     Neuro/Psych Anxiety negative neurological ROS     GI/Hepatic Neg liver ROS, hiatal hernia, GERD  ,Hx of diverticulosis   Endo/Other    Renal/GU Lab Results      Component                Value               Date                      CREATININE               1.10 (H)            06/27/2021               BUN                      32 (H)              06/27/2021                NA                       136                 06/27/2021                K                        3.8                 06/27/2021                 Musculoskeletal negative musculoskeletal ROS (+)   Abdominal   Peds  Hematology Lab Results      Component                Value               Date                      WBC                      8.1                 06/27/2021                HGB                      12.2                06/27/2021                HCT                      36.0  06/27/2021                MCV                      93.1                06/27/2021                PLT                      217                 06/27/2021                Anesthesia Other Findings ALL : Sulfa  Reproductive/Obstetrics                            Anesthesia Physical Anesthesia Plan  ASA: 2 and emergent  Anesthesia Plan: General   Post-op Pain Management: Dilaudid IV   Induction: Intravenous, Rapid  sequence and Cricoid pressure planned  PONV Risk Score and Plan: 4 or greater and Treatment may vary due to age or medical condition, Ondansetron and Midazolam  Airway Management Planned: Oral ETT  Additional Equipment: None  Intra-op Plan:   Post-operative Plan: Possible Post-op intubation/ventilation  Informed Consent: I have reviewed the patients History and Physical, chart, labs and discussed the procedure including the risks, benefits and alternatives for the proposed anesthesia with the patient or authorized representative who has indicated his/her understanding and acceptance.     Dental advisory given  Plan Discussed with: CRNA and Anesthesiologist  Anesthesia Plan Comments: (2 IV)       Anesthesia Quick Evaluation

## 2021-06-27 NOTE — Transfer of Care (Signed)
Immediate Anesthesia Transfer of Care Note  Patient: Carla Ramirez  Procedure(s) Performed: EXPLORATORY LAPAROTOMY, SIGMOID COLECTOMY WITH COLOSTOMY (Abdomen)  Patient Location: PACU  Anesthesia Type:General  Level of Consciousness: awake  Airway & Oxygen Therapy: Patient Spontanous Breathing and Patient connected to face mask oxygen  Post-op Assessment: Report given to RN and Post -op Vital signs reviewed and stable  Post vital signs: Reviewed and stable  Last Vitals:  Vitals Value Taken Time  BP    Temp    Pulse 70 06/27/21 1947  Resp 13 06/27/21 1947  SpO2 100 % 06/27/21 1947  Vitals shown include unvalidated device data.  Last Pain:  Vitals:   06/27/21 1649  TempSrc:   PainSc: 4       Patients Stated Pain Goal: 4 (38/17/71 1657)  Complications: No notable events documented.

## 2021-06-27 NOTE — Progress Notes (Signed)
Pharmacy Antibiotic Note  Carla Ramirez is a 70 y.o. female admitted on 06/27/2021 with  bowel perforation .  Pharmacy has been consulted for Zosyn dosing.  Plan: Zosyn 3.375g IV q8h (4 hour infusion). No dose adjustments needed, Pharmacy will sign off  Height: 5' 1.5" (156.2 cm) Weight: 63 kg (139 lb) IBW/kg (Calculated) : 48.95  Temp (24hrs), Avg:98.1 F (36.7 C), Min:97.9 F (36.6 C), Max:98.3 F (36.8 C)  Recent Labs  Lab 06/27/21 1323 06/27/21 1334 06/27/21 1502  WBC 8.1  --   --   CREATININE 1.03* 1.10*  --   LATICACIDVEN 2.4*  --  1.6    Estimated Creatinine Clearance: 41 mL/min (A) (by C-G formula based on SCr of 1.1 mg/dL (H)).    Allergies  Allergen Reactions   Sulfa Antibiotics Swelling    Facial swelling    Antimicrobials this admission: 1/30 Zosyn >>  Dose adjustments this admission: none  Microbiology results: 1/30 COVID: neg 1/30 Flu: neg  Thank you for allowing pharmacy to be a part of this patients care.  Peggyann Juba, PharmD, BCPS Pharmacy: [phone] 06/27/2021 4:27 PM

## 2021-06-27 NOTE — H&P (Signed)
Carla Ramirez 06-04-1950  [id number].    Requesting MD: Dr. Deno Etienne Chief Complaint/Reason for Consult: Free air   HPI: Carla Ramirez is a 71 y.o. female with a hx of HTN and GERD who presented to the ED for acute onset of abdominal pain. Patient reports that she had sudden onset of lower abdominal pain yesterday around 6pm. The pain was constant and severe, getting to a 7/10 at it's peak. She thought this was bad gas pain from trail mix she had 2 hours prior but the symptoms did not resolve. Since onset her pain became more generalized, but still most severe in the lower abdomen. She awoke with a temp of 100.1. She tried ibuprofen and pepto bismol for this without relief. Denies flatus or bm since symptoms onset. No hx of similar symptoms in the past. Denies associated n/v. No preceding abdominal pain prior to last night. She went to an urgent care and was found to have BP of 80s/40s so was directed to the ED. In the ED she has been afebrile, without tachycardia or hypotension. WBC 8.1. Lactic 2.4. CT A/P showed moderate amount of free air, compatible with bowel perforation. There are multiple small bowel loops with diffuse wall thickening and adjacent soft tissue edema as well as colonic diverticular changes. Patient has been resucitated with IVF bolus and is being started on maintenance fluid. She has received IV Zosyn. We were called for admission. Patient is not on any blood thinners. She denies prior abdominal surgeries. She does not take NSAIDs frequently, is not on steroids and denies excess alcohol use. She takes a PPI daily. Her last colonoscopy was reported to be within the last 2 years with [facility] and showed diverticulosis without other abnormalities per the patient; I am unable to see this in her chart.   ROS: Review of Systems  Constitutional:  Positive for fever (subjective. Tmax - 100.1). Negative for chills.  Respiratory:  Negative for cough and shortness of breath.    Cardiovascular:  Negative for chest pain.  Gastrointestinal:  Positive for abdominal pain. Negative for constipation, diarrhea, nausea and vomiting.  All other systems reviewed and are negative.  Family History  Problem Relation Age of Onset   Stroke Mother    Dementia Mother    Lymphoma Brother             Past Medical History:  Diagnosis Date   Acid reflux    Anxiety    Diverticulosis    Hiatal hernia    Hypertension    off medication since 9/13   Hypertension    Iron deficiency anemia 03/19/2014   Leukopenia 03/19/2014   Melanoma ([place]) 2018   On back.  Dr. Ubaldo Glassing follows.   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 She has never used smokeless tobacco. She reports current alcohol use. She reports that she does not use drugs.  Allergies:  Allergies  Allergen Reactions   Sulfa Antibiotics Swelling    Facial swelling    (Not in a hospital admission)    Physical Exam: Blood pressure 116/64, pulse 71, temperature 98.3 F (36.8 C), temperature source  Oral, resp. rate 18, height 5' 1.5" (1.562 m), weight 63 kg, last menstrual period 05/30/2003, SpO2 100 %. General: pleasant, WD/WN white female who is laying in bed in NAD HEENT: head is normocephalic, atraumatic.  Sclera are noninjected.  PERRL.  Ears and nose without any masses or lesions.  Mouth is pink and moist. Dentition fair Heart: regular, rate, and rhythm.  Normal s1,s2. No obvious murmurs, gallops, or rubs noted.  Palpable pedal pulses bilaterally  Lungs: CTAB, no wheezes, rhonchi, or rales noted.  Respiratory effort nonlabored Abd: Moderate distension. Upper abdomen  is soft. She has generalized tenderness greatest in the lower abdomen with guarding with palpation of the suprapubic abdomen. Hypoactive BS. no masses, hernias, or organomegaly MS: no BUE/BLE edema, calves soft and nontender Skin: warm and dry with no masses, lesions, or rashes Psych: A&Ox4 with an appropriate affect Neuro: cranial nerves grossly intact, equal strength in BUE/BLE bilaterally, normal speech, thought process intact, moves all extremities, gait not assessed  Results for orders placed or performed during the hospital encounter of 06/27/21 (from the past 48 hour(s))  Urinalysis, Routine w reflex microscopic Urine, Clean Catch     Status: Abnormal   Collection Time: 06/27/21 12:19 PM  Result Value Ref Range   Color, Urine AMBER (A) YELLOW   APPearance HAZY (A) CLEAR   Specific Gravity, Urine 1.023 1.005 - 1.030   pH 5.0 5.0 - 8.0   Glucose, UA NEGATIVE NEGATIVE mg/dL   Hgb urine dipstick NEGATIVE NEGATIVE   Bilirubin Urine NEGATIVE NEGATIVE   Ketones, ur NEGATIVE NEGATIVE mg/dL   Protein, ur 30 (A) NEGATIVE mg/dL   Nitrite NEGATIVE NEGATIVE   Leukocytes,Ua SMALL (A) NEGATIVE   RBC / HPF 0-5 0 - 5 RBC/hpf   WBC, UA 6-10 0 - 5 WBC/hpf   Bacteria, UA FEW (A) NONE SEEN   Squamous Epithelial / LPF 0-5 0 - 5   Mucus PRESENT    Hyaline Casts, UA PRESENT     Comment: Performed at [facility], [place] [address] [place], [place] [address]  Lipase, blood     Status: None   Collection Time: 06/27/21  1:23 PM  Result Value Ref Range   Lipase 28 11 - 51 U/L    Comment: Performed at [facility], [place] [address] [place], [place] [address]  Comprehensive metabolic panel     Status: Abnormal   Collection Time: 06/27/21  1:23 PM  Result Value Ref Range   Sodium 135 135 - 145 mmol/L   Potassium 3.5 3.5 - 5.1 mmol/L   Chloride 101 98 - 111 mmol/L   CO2 26 22 - 32 mmol/L   Glucose, Bld 123 (H) 70 - 99 mg/dL    Comment: Glucose reference range  applies only to samples taken after fasting for at least 8 hours.   BUN 33 (H) 8 - 23 mg/dL   Creatinine, Ser 1.03 (H) 0.44 - 1.00 mg/dL   Calcium 8.8 (L) 8.9 - 10.3 mg/dL   Total Protein 6.7 6.5 - 8.1 g/dL   Albumin 3.6 3.5 - 5.0 g/dL   AST 22 15 - 41 U/L   ALT 17 0 - 44 U/L   Alkaline Phosphatase 52 38 - 126 U/L   Total Bilirubin 0.9 0.3 - 1.2 mg/dL   GFR, Estimated 58 (L) >60 mL/min    Comment: (NOTE) Calculated using the CKD-EPI Creatinine Equation (2021)    Anion gap 8 5 - 15    Comment: Performed  at [facility], [place] [address], [place], [place] [address]  CBC     Status: None   Collection Time: 06/27/21  1:23 PM  Result Value Ref Range   WBC 8.1 4.0 - 10.5 K/uL   RBC 3.91 3.87 - 5.11 MIL/uL   Hemoglobin 12.6 12.0 - 15.0 g/dL   HCT 36.4 36.0 - 46.0 %   MCV 93.1 80.0 - 100.0 fL   MCH 32.2 26.0 - 34.0 pg   MCHC 34.6 30.0 - 36.0 g/dL   RDW 12.6 11.5 - 15.5 %   Platelets 217 150 - 400 K/uL   nRBC 0.0 0.0 - 0.2 %    Comment: Performed at [facility], [place] [address] [place], [place] [address]  Lactic acid, plasma     Status: Abnormal   Collection Time: 06/27/21  1:23 PM  Result Value Ref Range   Lactic Acid, Venous 2.4 (HH) 0.5 - 1.9 mmol/L    Comment: CRITICAL RESULT CALLED TO, READ BACK BY AND VERIFIED WITH: BRATU,D. EMTP AT 1358 06/27/21 MULLINS,T Performed at [facility], [place] [address] [place], [place] [address]   I-stat chem 8, ED (not at [facility] or [facility])     Status: Abnormal   Collection Time: 06/27/21  1:34 PM  Result Value Ref Range   Sodium 136 135 - 145 mmol/L   Potassium 3.8 3.5 - 5.1 mmol/L   Chloride 100 98 - 111 mmol/L   BUN 32 (H) 8 - 23 mg/dL   Creatinine, Ser 1.10 (H) 0.44 - 1.00 mg/dL   Glucose, Bld 121 (H) 70 - 99 mg/dL    Comment: Glucose reference range applies only to samples taken after fasting for at least 8 hours.   Calcium, Ion 1.17 1.15 - 1.40 mmol/L   TCO2 28 22 - 32 mmol/L    Hemoglobin 12.2 12.0 - 15.0 g/dL   HCT 36.0 36.0 - 46.0 %  Lactic acid, plasma     Status: None   Collection Time: 06/27/21  3:02 PM  Result Value Ref Range   Lactic Acid, Venous 1.6 0.5 - 1.9 mmol/L    Comment: Performed at [facility], [place] [address] [place], [place] [address]   CT ABDOMEN PELVIS W CONTRAST  Result Date: 06/27/2021 CLINICAL DATA:  Acute, non localized abdominal pain since last night. Some relief with Pepto-Bismol. EXAM: CT ABDOMEN AND PELVIS WITH CONTRAST TECHNIQUE: Multidetector CT imaging of the abdomen and pelvis was performed using the standard protocol following bolus administration of intravenous contrast. RADIATION DOSE REDUCTION: This exam was performed according to the departmental dose-optimization program which includes automated exposure control, adjustment of the mA and/or kV according to patient size and/or use of iterative reconstruction technique. CONTRAST:  127mL OMNIPAQUE IOHEXOL 300 MG/ML  SOLN COMPARISON:  Complete abdomen ultrasound dated 07/20/2014 FINDINGS: Lower chest: Enlarged heart. Mild right and minimal left dependent atelectasis. Hepatobiliary: Small liver cyst. Pancreas: Unremarkable. No pancreatic ductal dilatation or surrounding inflammatory changes. Spleen: Normal in size without focal abnormality. Adrenals/Urinary Tract: Adrenal glands are unremarkable. Kidneys are normal, without renal calculi, focal lesion, or hydronephrosis. Bladder is unremarkable. Stomach/Bowel: Multiple normal caliber small bowel loops with diffuse wall thickening and adjacent soft tissue stranding and edema. No pneumatosis is seen. Multiple colonic diverticula without evidence of diverticulitis. Multiple high density particles in the stool compatible with the history of taking Pepto-Bismol. Unremarkable stomach.  The appendix is not visualized. Vascular/Lymphatic: Atheromatous arterial calcifications without aneurysm. No enlarged lymph nodes.  Reproductive: 2.1  cm simple appearing left ovarian cyst. Unremarkable uterus and right adnexa. Other: Moderate amount of free peritoneal air. Small amount of free peritoneal fluid. Musculoskeletal: Lumbar and lower thoracic spine degenerative changes. IMPRESSION: 1. Moderate amount of free peritoneal air, compatible with bowel perforation. 2. Multiple small bowel loops with diffuse wall thickening and adjacent soft tissue edema. This is most likely due to infectious enteritis. This does not have the typical distribution of ischemic changes. 3. Extensive colonic diverticulosis with no localized findings suspicious for diverticulitis. 4. Cardiomegaly. 5. 2.1 cm simple appearing left ovarian cyst. No follow-up imaging recommended. Note: This recommendation does not apply to those with increased risk (genetic, family history, elevated tumor markers or other high-risk factors) of ovarian cancer. Reference: JACR 2020 Feb; 17(2):248-254 Critical Value/emergent results were called by telephone at the time of interpretation on 06/27/2021 at 2:41 pm to provider DAN FLOYD , who verbally acknowledged these results. Electronically Signed   By: Claudie Revering M.D.   On: 06/27/2021 14:48    Anti-infectives (From admission, onward)    Start     Dose/Rate Route Frequency Ordered Stop   06/27/21 1500  piperacillin-tazobactam (ZOSYN) IVPB 3.375 g        3.375 g 100 mL/hr over 30 Minutes Intravenous  Once 06/27/21 1445 06/27/21 1531       Assessment/Plan Pneumoperitoneum This is a 71 y.o. female that presented with < 24 hours of abdominal pain. She was found to be hypotensive at [facility] and sent to the ED. WBC 8.1. Lactic 2.4. CT A/P showed moderate amount of free air, compatible with bowel perforation. There are multiple small bowel loops with diffuse wall thickening and adjacent soft tissue edema as well as colonic diverticular changes. I reviewed the images with one of my MD's and suspect pneumoperitoneum is coming from her colon. On exam  patient has generalized tenderness that is greatest in the lower abdomen with some guarding. Would recommend OR for exploratory laparotomy tonight. I discussed with the patient the anatomy and physiology of the GI tract. The planned procedure and material risks were discussed with the patient. Risks include but are not limited to anesthesia, pain, bleeding, infection, scarring, hernia, damage to surrounding structures (blood vessels/nerves/viscus/organs/ureter), ileus, post operative abscess, leak from anastomosis if one is made and possible need for stoma/ostomy. We discussed if ostomy is created we will have [facility] nurse see her post operative to assist with learning to care for this. We also discussed typical post-operative care including having PT see her post op. The patient's questions were answered to their satisfaction, they voiced understanding and elected to proceed with surgery. Her husband was at bedside for this conversation. She is being resuscitated with IVF and has already been started on IV abx. Please keep NPO. Admit to inpatient.   FEN - NPO, IVF VTE - SCDs, Lovenox ID - Zosyn Foley - None currently  Dispo - Admit to inpatient. OR tonight   HTN GERD  L ovarian cyst - will need outpatient f/u  This care required high  level of medical decision making.   Jillyn Ledger, [facility] 06/27/2021, 3:43 PM Please see Amion for pager number during day hours 7:00am-4:30pm

## 2021-06-28 ENCOUNTER — Ambulatory Visit: Payer: Medicare PPO | Admitting: Neurology

## 2021-06-28 ENCOUNTER — Encounter (HOSPITAL_COMMUNITY): Payer: Self-pay | Admitting: General Surgery

## 2021-06-28 LAB — BASIC METABOLIC PANEL
Anion gap: 6 (ref 5–15)
BUN: 23 mg/dL (ref 8–23)
CO2: 26 mmol/L (ref 22–32)
Calcium: 8.3 mg/dL — ABNORMAL LOW (ref 8.9–10.3)
Chloride: 107 mmol/L (ref 98–111)
Creatinine, Ser: 0.85 mg/dL (ref 0.44–1.00)
GFR, Estimated: >60 mL/min (ref >60)
Glucose, Bld: 144 mg/dL — ABNORMAL HIGH (ref 70–99)
Potassium: 4.4 mmol/L (ref 3.5–5.1)
Sodium: 139 mmol/L (ref 135–145)

## 2021-06-28 LAB — CBC
HCT: 34.4 % — ABNORMAL LOW (ref 36.0–46.0)
Hemoglobin: 11.4 g/dL — ABNORMAL LOW (ref 12.0–15.0)
MCH: 32.5 pg (ref 26.0–34.0)
MCHC: 33.1 g/dL (ref 30.0–36.0)
MCV: 98 fL (ref 80.0–100.0)
Platelets: 216 10*3/uL (ref 150–400)
RBC: 3.51 MIL/uL — ABNORMAL LOW (ref 3.87–5.11)
RDW: 12.9 % (ref 11.5–15.5)
WBC: 9.9 10*3/uL (ref 4.0–10.5)
nRBC: 0 % (ref 0.0–0.2)

## 2021-06-28 LAB — HIV ANTIBODY (ROUTINE TESTING W REFLEX): HIV Screen 4th Generation wRfx: NONREACTIVE

## 2021-06-28 MED ORDER — ACETAMINOPHEN 500 MG PO TABS
1000.0000 mg | ORAL_TABLET | Freq: Four times a day (QID) | ORAL | Status: DC | PRN
  Administered 2021-07-01: 1000 mg via ORAL
  Filled 2021-06-28: qty 2

## 2021-06-28 MED ORDER — METHOCARBAMOL 500 MG PO TABS
500.0000 mg | ORAL_TABLET | Freq: Four times a day (QID) | ORAL | Status: DC | PRN
  Administered 2021-06-28: 500 mg via ORAL
  Filled 2021-06-28: qty 1

## 2021-06-28 MED ORDER — METHOCARBAMOL 1000 MG/10ML IJ SOLN
500.0000 mg | Freq: Three times a day (TID) | INTRAVENOUS | Status: DC
  Filled 2021-06-28: qty 5

## 2021-06-28 MED ORDER — OXYCODONE HCL 5 MG PO TABS: 5.0000 mg | ORAL_TABLET | ORAL | Status: DC | PRN

## 2021-06-28 MED ORDER — MORPHINE SULFATE (PF) 2 MG/ML IV SOLN: 2.0000 mg | INTRAVENOUS | Status: DC | PRN

## 2021-06-28 MED ORDER — ACETAMINOPHEN 10 MG/ML IV SOLN: 1000.0000 mg | Freq: Four times a day (QID) | INTRAVENOUS | Status: DC

## 2021-06-28 NOTE — Anesthesia Postprocedure Evaluation (Signed)
Anesthesia Post Note  Patient: Carla Ramirez  Procedure(s) Performed: EXPLORATORY LAPAROTOMY, SIGMOID COLECTOMY WITH COLOSTOMY (Abdomen)     Patient location during evaluation: PACU Anesthesia Type: General Level of consciousness: awake and alert Pain management: pain level controlled Vital Signs Assessment: post-procedure vital signs reviewed and stable Respiratory status: spontaneous breathing, nonlabored ventilation, respiratory function stable and patient connected to nasal cannula oxygen Cardiovascular status: blood pressure returned to baseline and stable Postop Assessment: no apparent nausea or vomiting Anesthetic complications: no   No notable events documented.  Last Vitals:  Vitals:   06/28/21 0844 06/28/21 1238  BP: (!) 111/51 (!) 102/58  Pulse: 60 63  Resp: 15 16  Temp: 36.4 C 36.9 C  SpO2: 99% 98%    Last Pain:  Vitals:   06/28/21 1238  TempSrc: Oral  PainSc:                  Barnet Glasgow

## 2021-06-28 NOTE — Progress Notes (Addendum)
1 Day Post-Op  Subjective: CC: Doing well. Feels better than pre-op. She has some soreness, mainly around her incision. No n/v. No ostomy output yet. Ambulate in halls with IV pole. Foley out. Small void since removal.   Objective: Vital signs in last 24 hours: Temp:  [97.4 F (36.3 C)-98.7 F (37.1 C)] 97.6 F (36.4 C) (01/31 0844) Pulse Rate:  [60-72] 60 (01/31 0844) Resp:  [10-18] 16 (01/31 0500) BP: (103-131)/(48-66) 111/51 (01/31 0844) SpO2:  [94 %-100 %] 99 % (01/31 0844) Weight:  [63 kg] 63 kg (01/30 1234)    Intake/Output from previous day: 01/30 0701 - 01/31 0700 In: 2457.7 [I.V.:2419.3; IV Piggyback:38.4] Out: 475 [Urine:445; Blood:30] Intake/Output this shift: Total I/O In: -  Out: 100 [Urine:100]  PE: Gen:  Alert, NAD, pleasant Card:  Reg Pulm:  Normal rate and effort  Abd: Soft, ND, appropriately tender, +BS, ostomy pink and viable. Sweat in ostomy bag - no air or stool. Midline wound clean.  Ext:  No LE edema Psych: A&Ox3  Skin: no rashes noted, warm and dry  Lab Results:  Recent Labs    06/27/21 1323 06/27/21 1334 06/28/21 0850  WBC 8.1  --  9.9  HGB 12.6 12.2 11.4*  HCT 36.4 36.0 34.4*  PLT 217  --  216   BMET Recent Labs    06/27/21 1323 06/27/21 1334 06/28/21 0850  NA 135 136 139  K 3.5 3.8 4.4  CL 101 100 107  CO2 26  --  26  GLUCOSE 123* 121* 144*  BUN 33* 32* 23  CREATININE 1.03* 1.10* 0.85  CALCIUM 8.8*  --  8.3*   PT/INR No results for input(s): LABPROT, INR in the last 72 hours. CMP     Component Value Date/Time   NA 139 06/28/2021 0850   K 4.4 06/28/2021 0850   CL 107 06/28/2021 0850   CO2 26 06/28/2021 0850   GLUCOSE 144 (H) 06/28/2021 0850   BUN 23 06/28/2021 0850   CREATININE 0.85 06/28/2021 0850   CALCIUM 8.3 (L) 06/28/2021 0850   PROT 6.7 06/27/2021 1323   ALBUMIN 3.6 06/27/2021 1323   AST 22 06/27/2021 1323   ALT 17 06/27/2021 1323   ALKPHOS 52 06/27/2021 1323   BILITOT 0.9 06/27/2021 1323   GFRNONAA  >60 06/28/2021 0850   Lipase     Component Value Date/Time   LIPASE 28 06/27/2021 1323    Studies/Results: CT ABDOMEN PELVIS W CONTRAST  Result Date: 06/27/2021 CLINICAL DATA:  Acute, non localized abdominal pain since last night. Some relief with Pepto-Bismol. EXAM: CT ABDOMEN AND PELVIS WITH CONTRAST TECHNIQUE: Multidetector CT imaging of the abdomen and pelvis was performed using the standard protocol following bolus administration of intravenous contrast. RADIATION DOSE REDUCTION: This exam was performed according to the departmental dose-optimization program which includes automated exposure control, adjustment of the mA and/or kV according to patient size and/or use of iterative reconstruction technique. CONTRAST:  111mL OMNIPAQUE IOHEXOL 300 MG/ML  SOLN COMPARISON:  Complete abdomen ultrasound dated 07/20/2014 FINDINGS: Lower chest: Enlarged heart. Mild right and minimal left dependent atelectasis. Hepatobiliary: Small liver cyst. Pancreas: Unremarkable. No pancreatic ductal dilatation or surrounding inflammatory changes. Spleen: Normal in size without focal abnormality. Adrenals/Urinary Tract: Adrenal glands are unremarkable. Kidneys are normal, without renal calculi, focal lesion, or hydronephrosis. Bladder is unremarkable. Stomach/Bowel: Multiple normal caliber small bowel loops with diffuse wall thickening and adjacent soft tissue stranding and edema. No pneumatosis is seen. Multiple colonic diverticula without evidence of  diverticulitis. Multiple high density particles in the stool compatible with the history of taking Pepto-Bismol. Unremarkable stomach.  The appendix is not visualized. Vascular/Lymphatic: Atheromatous arterial calcifications without aneurysm. No enlarged lymph nodes. Reproductive: 2.1 cm simple appearing left ovarian cyst. Unremarkable uterus and right adnexa. Other: Moderate amount of free peritoneal air. Small amount of free peritoneal fluid. Musculoskeletal: Lumbar and  lower thoracic spine degenerative changes. IMPRESSION: 1. Moderate amount of free peritoneal air, compatible with bowel perforation. 2. Multiple small bowel loops with diffuse wall thickening and adjacent soft tissue edema. This is most likely due to infectious enteritis. This does not have the typical distribution of ischemic changes. 3. Extensive colonic diverticulosis with no localized findings suspicious for diverticulitis. 4. Cardiomegaly. 5. 2.1 cm simple appearing left ovarian cyst. No follow-up imaging recommended. Note: This recommendation does not apply to those with increased risk (genetic, family history, elevated tumor markers or other high-risk factors) of ovarian cancer. Reference: JACR 2020 Feb; 17(2):248-254 Critical Value/emergent results were called by telephone at the time of interpretation on 06/27/2021 at 2:41 pm to provider DAN FLOYD , who verbally acknowledged these results. Electronically Signed   By: Claudie Revering M.D.   On: 06/27/2021 14:48    Anti-infectives: Anti-infectives (From admission, onward)    Start     Dose/Rate Route Frequency Ordered Stop   06/27/21 2200  piperacillin-tazobactam (ZOSYN) IVPB 3.375 g        3.375 g 12.5 mL/hr over 240 Minutes Intravenous Every 8 hours 06/27/21 1626 07/02/21 2159   06/27/21 1500  piperacillin-tazobactam (ZOSYN) IVPB 3.375 g        3.375 g 100 mL/hr over 30 Minutes Intravenous  Once 06/27/21 1445 06/27/21 1531        Assessment/Plan POD 1 s/p exploratory laparotomy, sigmoid colectomy with colostomy for perforated sigmoid diverticulitis - Dr. Autumn Messing - 06/27/2021 - AROBF. Okay for CLD today - Cont 5d abx post op. Monitor wbc - Start BID WTD to midline wound - WOCN consult for new ostomy - Path pending  - Pulm toilet - Mobilize  FEN - CLD, IVF VTE - SCDs, subq heparin ID - Zosyn 1/30 >> Foley - D/c'd POD 1. Monitor for retention. PRN bladder scan. Cr normalized.   HTN - home meds + IV PRN GERD  - PPI L ovarian cyst  - will need outpatient f/u  This care required moderate level of medical decision making.    LOS: 1 day    Jillyn Ledger , [facility] 06/28/2021, 10:42 AM Please see Amion for pager number during day hours 7:00am-4:30pm

## 2021-06-28 NOTE — Consult Note (Signed)
[facility] Nurse ostomy consult note Stoma type/location: LLQ colostomy  POD 1.  Husband present for teaching session. Very engaged. Stomal assessment/size: 1 and 3/8 inch, slightly oval, red, raised, lumen in center, slightly edematous Peristomal assessment: intact. Treatment options for stomal/peristomal skin: skin barrier ring Output: serosanguinous, flatus, small smear of stool at lumen Ostomy pouching: 2pc. 2 and 1/4 inch pouching system with skin barrier ring Education provided:  Explained role of ostomy nurse and creation of stoma  Explained stoma characteristics (budded, flush, color, texture, care) Demonstrated pouch change (cutting new skin barrier, measuring stoma, cleaning peristomal skin and stoma, use of barrier ring) Education on emptying when 1/3 to 1/2 full and how to empty Demonstrated use of wick to clean spout   Answered patient/family questions.  Patient able to give return demo x2 on opening and closing pouch using [employer] closure.  Patient would benefit from referral to outpatient ostomy clinic.  If you agree, please initiate referral.    Enrolled patient in [facility] program: Yes  [facility] nursing team will follow, and will remain available to this patient, the nursing and medical teams.   Thanks, Maudie Flakes, MSN, RN, [place], Arther Abbott  Pager# [phone]

## 2021-06-29 LAB — CBC
HCT: 29.1 % — ABNORMAL LOW (ref 36.0–46.0)
Hemoglobin: 9.4 g/dL — ABNORMAL LOW (ref 12.0–15.0)
MCH: 31.6 pg (ref 26.0–34.0)
MCHC: 32.3 g/dL (ref 30.0–36.0)
MCV: 98 fL (ref 80.0–100.0)
Platelets: 174 10*3/uL (ref 150–400)
RBC: 2.97 MIL/uL — ABNORMAL LOW (ref 3.87–5.11)
RDW: 12.9 % (ref 11.5–15.5)
WBC: 7.2 10*3/uL (ref 4.0–10.5)
nRBC: 0 % (ref 0.0–0.2)

## 2021-06-29 LAB — BASIC METABOLIC PANEL
Anion gap: 4 — ABNORMAL LOW (ref 5–15)
BUN: 13 mg/dL (ref 8–23)
CO2: 21 mmol/L — ABNORMAL LOW (ref 22–32)
Calcium: 8 mg/dL — ABNORMAL LOW (ref 8.9–10.3)
Chloride: 116 mmol/L — ABNORMAL HIGH (ref 98–111)
Creatinine, Ser: 0.81 mg/dL (ref 0.44–1.00)
GFR, Estimated: >60 mL/min (ref >60)
Glucose, Bld: 135 mg/dL — ABNORMAL HIGH (ref 70–99)
Potassium: 3.9 mmol/L (ref 3.5–5.1)
Sodium: 141 mmol/L (ref 135–145)

## 2021-06-29 LAB — SURGICAL PATHOLOGY

## 2021-06-29 MED ORDER — TRAMADOL HCL 50 MG PO TABS
50.0000 mg | ORAL_TABLET | Freq: Four times a day (QID) | ORAL | Status: DC | PRN
  Administered 2021-06-29 – 2021-06-30 (×2): 100 mg via ORAL
  Filled 2021-06-29: qty 2
  Filled 2021-06-29: qty 1
  Filled 2021-06-29 (×2): qty 2

## 2021-06-29 NOTE — Progress Notes (Signed)
Progress Note  2 Days Post-Op  Subjective: Pt reports passing some flatus via stoma. She tolerated CLD and has had good pain control. Denies nausea or vomiting. Pain is primarily with mobilizing but she is getting up and walking.   Objective: Vital signs in last 24 hours: Temp:  [98.4 F (36.9 C)-99 F (37.2 C)] 99 F (37.2 C) (02/01 0558) Pulse Rate:  [63-70] 67 (02/01 0558) Resp:  [16] 16 (02/01 0558) BP: (102-144)/(58-67) 144/67 (02/01 0558) SpO2:  [93 %-98 %] 93 % (02/01 0558)    Intake/Output from previous day: 01/31 0701 - 02/01 0700 In: 3751.3 [P.O.:1140; I.V.:2399.8; IV Piggyback:211.6] Out: 2100 [Urine:2100] Intake/Output this shift: No intake/output data recorded.  PE: General: pleasant, WD, WN female who is laying in bed in NAD Heart: regular, rate, and rhythm.  Lungs: Respiratory effort nonlabored Abd: soft, appropriately ttp, ND, +BS, midline wound clean, stoma viable with smear of stool at os Skin: warm and dry with no masses, lesions, or rashes Psych: A&Ox3 with an appropriate affect.    Lab Results:  Recent Labs    06/28/21 0850 06/29/21 0424  WBC 9.9 7.2  HGB 11.4* 9.4*  HCT 34.4* 29.1*  PLT 216 174   BMET Recent Labs    06/28/21 0850 06/29/21 0424  NA 139 141  K 4.4 3.9  CL 107 116*  CO2 26 21*  GLUCOSE 144* 135*  BUN 23 13  CREATININE 0.85 0.81  CALCIUM 8.3* 8.0*   PT/INR No results for input(s): LABPROT, INR in the last 72 hours. CMP     Component Value Date/Time   NA 141 06/29/2021 0424   K 3.9 06/29/2021 0424   CL 116 (H) 06/29/2021 0424   CO2 21 (L) 06/29/2021 0424   GLUCOSE 135 (H) 06/29/2021 0424   BUN 13 06/29/2021 0424   CREATININE 0.81 06/29/2021 0424   CALCIUM 8.0 (L) 06/29/2021 0424   PROT 6.7 06/27/2021 1323   ALBUMIN 3.6 06/27/2021 1323   AST 22 06/27/2021 1323   ALT 17 06/27/2021 1323   ALKPHOS 52 06/27/2021 1323   BILITOT 0.9 06/27/2021 1323   GFRNONAA >60 06/29/2021 0424   Lipase     Component  Value Date/Time   LIPASE 28 06/27/2021 1323       Studies/Results: CT ABDOMEN PELVIS W CONTRAST  Result Date: 06/27/2021 CLINICAL DATA:  Acute, non localized abdominal pain since last night. Some relief with Pepto-Bismol. EXAM: CT ABDOMEN AND PELVIS WITH CONTRAST TECHNIQUE: Multidetector CT imaging of the abdomen and pelvis was performed using the standard protocol following bolus administration of intravenous contrast. RADIATION DOSE REDUCTION: This exam was performed according to the departmental dose-optimization program which includes automated exposure control, adjustment of the mA and/or kV according to patient size and/or use of iterative reconstruction technique. CONTRAST:  171mL OMNIPAQUE IOHEXOL 300 MG/ML  SOLN COMPARISON:  Complete abdomen ultrasound dated 07/20/2014 FINDINGS: Lower chest: Enlarged heart. Mild right and minimal left dependent atelectasis. Hepatobiliary: Small liver cyst. Pancreas: Unremarkable. No pancreatic ductal dilatation or surrounding inflammatory changes. Spleen: Normal in size without focal abnormality. Adrenals/Urinary Tract: Adrenal glands are unremarkable. Kidneys are normal, without renal calculi, focal lesion, or hydronephrosis. Bladder is unremarkable. Stomach/Bowel: Multiple normal caliber small bowel loops with diffuse wall thickening and adjacent soft tissue stranding and edema. No pneumatosis is seen. Multiple colonic diverticula without evidence of diverticulitis. Multiple high density particles in the stool compatible with the history of taking Pepto-Bismol. Unremarkable stomach.  The appendix is not visualized. Vascular/Lymphatic: Atheromatous arterial  calcifications without aneurysm. No enlarged lymph nodes. Reproductive: 2.1 cm simple appearing left ovarian cyst. Unremarkable uterus and right adnexa. Other: Moderate amount of free peritoneal air. Small amount of free peritoneal fluid. Musculoskeletal: Lumbar and lower thoracic spine degenerative changes.  IMPRESSION: 1. Moderate amount of free peritoneal air, compatible with bowel perforation. 2. Multiple small bowel loops with diffuse wall thickening and adjacent soft tissue edema. This is most likely due to infectious enteritis. This does not have the typical distribution of ischemic changes. 3. Extensive colonic diverticulosis with no localized findings suspicious for diverticulitis. 4. Cardiomegaly. 5. 2.1 cm simple appearing left ovarian cyst. No follow-up imaging recommended. Note: This recommendation does not apply to those with increased risk (genetic, family history, elevated tumor markers or other high-risk factors) of ovarian cancer. Reference: JACR 2020 Feb; 17(2):248-254 Critical Value/emergent results were called by telephone at the time of interpretation on 06/27/2021 at 2:41 pm to provider DAN FLOYD , who verbally acknowledged these results. Electronically Signed   By: Claudie Revering M.D.   On: 06/27/2021 14:48    Anti-infectives: Anti-infectives (From admission, onward)    Start     Dose/Rate Route Frequency Ordered Stop   06/27/21 2200  piperacillin-tazobactam (ZOSYN) IVPB 3.375 g        3.375 g 12.5 mL/hr over 240 Minutes Intravenous Every 8 hours 06/27/21 1626 07/02/21 2159   06/27/21 1500  piperacillin-tazobactam (ZOSYN) IVPB 3.375 g        3.375 g 100 mL/hr over 30 Minutes Intravenous  Once 06/27/21 1445 06/27/21 1531        Assessment/Plan POD2 s/p exploratory laparotomy, sigmoid colectomy with colostomy for perforated sigmoid diverticulitis - Dr. Autumn Messing - 06/27/2021 - tolerating CLD - advance to FLD - Cont 5d abx post op. WBC 7.2 today - Continue BID WTD to midline wound - WOCN following for new ostomy - Path pending  - Pulm toilet - Mobilize   FEN - FLD, decrease IVF to 50 cc/h VTE - SCDs, subq heparin ID - Zosyn 1/30 >> Foley - removed 1/31, voiding    HTN - home meds + IV PRN GERD  - PPI L ovarian cyst - will need outpatient f/u   LOS: 2 days   I reviewed  Consultant WOC RN notes, last 24 h vitals and pain scores, last 48 h intake and output, and last 24 h labs and trends.  This care required moderate level of medical decision making.    Norm Parcel, [facility] Surgery 06/29/2021, 9:10 AM Please see Amion for pager number during day hours 7:00am-4:30pm

## 2021-06-29 NOTE — TOC Initial Note (Signed)
Transition of Care [facility]) - Initial/Assessment Note   Patient Details  Name: Carla Ramirez MRN: [id number] Date of Birth: [birth year]  Transition of [facility]) CM/SW Contact:    Sherie Don, LCSW Phone Number: 06/29/2021, 2:01 PM  Clinical Narrative: Patient is a new ostomy and will need HHRN at discharge. CSW spoke with patient and is agreeable to [facility] referral. CSW made referral to Cindie with [facility], which was accepted. CSW updated patient. TOC to follow.  Expected Discharge Plan: [place] Barriers to Discharge: Continued Medical Work up  Patient Goals and CMS Choice Patient states their goals for this hospitalization and ongoing recovery are:: Return home with [facility] CMS Medicare.gov Compare Post Acute Care list provided to:: Patient Choice offered to / list presented to : Patient  Expected Discharge Plan and Services Expected Discharge Plan: [place] In-house Referral: Clinical Social Work Post Acute Care Choice: [place] arrangements for the past 2 months: Single Family Home             DME Arranged: N/A DME Agency: NA HH Arranged: RN [place] Agency: [place] Date [place]: 06/29/21 Time [place]: 0109 Representative spoke with at [place]: Tat [place] Arrangements/Services Living arrangements for the past 2 months: [place] with:: Spouse Patient language and need for interpreter reviewed:: Yes Do you feel safe going back to the place where you live?: Yes      Need for Family Participation in Patient Care: No (Comment) Care giver support system in place?: Yes (comment) Criminal Activity/Legal Involvement Pertinent to Current Situation/Hospitalization: No - Comment as needed  Activities of Daily Living Home Assistive Devices/Equipment: Eyeglasses ADL Screening (condition at time of admission) Patient's cognitive ability adequate to safely complete daily activities?:  Yes Is the patient deaf or have difficulty hearing?: No Does the patient have difficulty seeing, even when wearing glasses/contacts?: No Does the patient have difficulty concentrating, remembering, or making decisions?: No Patient able to express need for assistance with ADLs?: Yes Does the patient have difficulty dressing or bathing?: No Independently performs ADLs?: Yes (appropriate for developmental age) Does the patient have difficulty walking or climbing stairs?: No Weakness of Legs: None Weakness of Arms/Hands: None  Permission Sought/Granted Permission sought to share information with : Other (comment) Permission granted to share information with : Yes, Verbal Permission Granted Permission granted to share info w AGENCY: [place] agencies  Emotional Assessment Appearance:: Appears stated age Attitude/Demeanor/Rapport: Engaged Affect (typically observed): Accepting Orientation: : Oriented to Self, Oriented to Place, Oriented to  Time, Oriented to Situation Alcohol / Substance Use: Not Applicable Psych Involvement: No (comment)  Admission diagnosis:  Pneumoperitoneum [K66.8] Bowel perforation (HCC) [K63.1] Perforation of sigmoid colon due to diverticulitis [K57.20] Patient Active Problem List   Diagnosis Date Noted   Pneumoperitoneum 06/27/2021   Perforation of sigmoid colon due to diverticulitis 06/27/2021   Renal artery stenosis ([place]) 03/28/2019   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 [place] [address] Phone: [phone] Fax: [phone]  Readmission Risk Interventions No flowsheet data found.

## 2021-06-30 LAB — CBC
HCT: 30.7 % — ABNORMAL LOW (ref 36.0–46.0)
Hemoglobin: 9.9 g/dL — ABNORMAL LOW (ref 12.0–15.0)
MCH: 31.1 pg (ref 26.0–34.0)
MCHC: 32.2 g/dL (ref 30.0–36.0)
MCV: 96.5 fL (ref 80.0–100.0)
Platelets: 168 10*3/uL (ref 150–400)
RBC: 3.18 MIL/uL — ABNORMAL LOW (ref 3.87–5.11)
RDW: 12.7 % (ref 11.5–15.5)
WBC: 4.8 10*3/uL (ref 4.0–10.5)
nRBC: 0 % (ref 0.0–0.2)

## 2021-06-30 LAB — BASIC METABOLIC PANEL
Anion gap: 4 — ABNORMAL LOW (ref 5–15)
BUN: 10 mg/dL (ref 8–23)
CO2: 24 mmol/L (ref 22–32)
Calcium: 8 mg/dL — ABNORMAL LOW (ref 8.9–10.3)
Chloride: 108 mmol/L (ref 98–111)
Creatinine, Ser: 0.8 mg/dL (ref 0.44–1.00)
GFR, Estimated: >60 mL/min (ref >60)
Glucose, Bld: 94 mg/dL (ref 70–99)
Potassium: 3.6 mmol/L (ref 3.5–5.1)
Sodium: 136 mmol/L (ref 135–145)

## 2021-06-30 MED ORDER — DOCUSATE SODIUM 100 MG PO CAPS
100.0000 mg | ORAL_CAPSULE | Freq: Two times a day (BID) | ORAL | Status: DC
  Administered 2021-06-30 – 2021-07-02 (×5): 100 mg via ORAL
  Filled 2021-06-30 (×5): qty 1

## 2021-06-30 NOTE — Progress Notes (Signed)
Progress Note  3 Days Post-Op  Subjective: Doing well.  No bowel movement yet.    Objective: Vital signs in last 24 hours: Temp:  [97.8 F (36.6 C)-98.3 F (36.8 C)] 97.8 F (36.6 C) (02/02 0511) Pulse Rate:  [58] 58 (02/02 0511) Resp:  [16] 16 (02/02 0511) BP: (127-132)/(63-66) 127/63 (02/02 0511) SpO2:  [93 %] 93 % (02/02 0511) Last BM Date: 06/29/21  Intake/Output from previous day: 02/01 0701 - 02/02 0700 In: 1229.9 [P.O.:1080; IV Piggyback:149.9] Out: 3000 [Urine:3000] Intake/Output this shift: No intake/output data recorded.  PE: General: pleasant, WD, WN female who is laying in bed in NAD Heart: regular, rate, and rhythm.  Lungs: Respiratory effort nonlabored Abd: soft, appropriately ttp, ND, +BS, midline wound clean, stoma viable  Skin: warm and dry with no masses, lesions, or rashes Psych: A&Ox3 with an appropriate affect.    Lab Results:  Recent Labs    06/29/21 0424 06/30/21 0428  WBC 7.2 4.8  HGB 9.4* 9.9*  HCT 29.1* 30.7*  PLT 174 168   BMET Recent Labs    06/29/21 0424 06/30/21 0428  NA 141 136  K 3.9 3.6  CL 116* 108  CO2 21* 24  GLUCOSE 135* 94  BUN 13 10  CREATININE 0.81 0.80  CALCIUM 8.0* 8.0*   PT/INR No results for input(s): LABPROT, INR in the last 72 hours. CMP     Component Value Date/Time   NA 136 06/30/2021 0428   K 3.6 06/30/2021 0428   CL 108 06/30/2021 0428   CO2 24 06/30/2021 0428   GLUCOSE 94 06/30/2021 0428   BUN 10 06/30/2021 0428   CREATININE 0.80 06/30/2021 0428   CALCIUM 8.0 (L) 06/30/2021 0428   PROT 6.7 06/27/2021 1323   ALBUMIN 3.6 06/27/2021 1323   AST 22 06/27/2021 1323   ALT 17 06/27/2021 1323   ALKPHOS 52 06/27/2021 1323   BILITOT 0.9 06/27/2021 1323   GFRNONAA >60 06/30/2021 0428   Lipase     Component Value Date/Time   LIPASE 28 06/27/2021 1323       Studies/Results: No results found.  Anti-infectives: Anti-infectives (From admission, onward)    Start     Dose/Rate Route  Frequency Ordered Stop   06/27/21 2200  piperacillin-tazobactam (ZOSYN) IVPB 3.375 g        3.375 g 12.5 mL/hr over 240 Minutes Intravenous Every 8 hours 06/27/21 1626 07/02/21 2159   06/27/21 1500  piperacillin-tazobactam (ZOSYN) IVPB 3.375 g        3.375 g 100 mL/hr over 30 Minutes Intravenous  Once 06/27/21 1445 06/27/21 1531        Assessment/Plan POD3 s/p exploratory laparotomy, sigmoid colectomy with colostomy for perforated sigmoid diverticulitis - Dr. Autumn Messing - 06/27/2021 - Regular diet - Cont 5d abx post op - Continue BID WTD to midline wound - WOCN following for new ostomy - Path pending  - Pulm toilet - [place] - home once she has a bowel movement, she's doing well   FEN - Reg, d/c IVF VTE - SCDs, subq heparin ID - Zosyn 1/30 >> Foley - removed 1/31, voiding    HTN - home meds + IV PRN GERD  - PPI L ovarian cyst - will need outpatient f/u   LOS: 3 days   I reviewed Consultant WOC RN notes, last 24 h vitals and pain scores, last 48 h intake and output, and last 24 h labs and trends.  This care required moderate level of  medical decision making.    Felicie Morn, [place] [facility] 06/30/2021, 1:35 PM Please see Amion for pager number during day hours 7:00am-4:30pm

## 2021-06-30 NOTE — Discharge Instructions (Addendum)
#######################################################  Ostomy Support Information  Theresia Majors heard that people get along just fine with only one of their eyes, or one of their lungs, or one of their kidneys. 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   YOU ARE NOT ALONE.  There are nearly three quarters of a million people in the [place] who have an ostomy; people who have had surgery to remove all or part of their colons or bladders.   There is even a national association, the [place] [employer] [place] with over 350 local affiliated support groups that are organized by volunteers who provide peer support and counseling. [facility] has a toll free telephone num-ber, [phone] and an educational, interactive website, www.ostomy.org   An ostomy is an opening in the belly (abdominal wall) made by surgery. Ostomates are people who have had this procedure. The opening (stoma) allows the kidney or bowel to grdischarge waste. An external pouch covers the stoma to collect waste. Pouches are are a simple bag and are odor free. Different companies have disposable or reusable pouches to fit one's lifestyle. An ostomy can either be temporary or permanent.   THERE ARE THREE MAIN TYPES OF OSTOMIES Colostomy. A colostomy is a surgically created opening in the large intestine (colon). Ileostomy. An ileostomy is a surgically created opening in the small intestine. Urostomy. A urostomy is a surgically created opening to divert urine away from the bladder.  OSTOMY Care  The following guidelines will make care of your colostomy easier. Keep this information close by for quick reference.  Helpful DIET hints Eat a well-balanced diet including vegetables and fresh  fruits. Eat on a regular schedule.  Drink at least 6 to 8 glasses of fluids daily. Eat slowly in a relaxed atmosphere. Chew your food thoroughly. Avoid chewing gum, smoking, and drinking from a straw. This will help decrease the amount of air you swallow, which may help reduce gas. Eating yogurt or drinking buttermilk may help reduce gas.  To control gas at night, do not eat after 8 p.m. This will give your bowel time to quiet down before you go to bed.  If gas is a problem, you can purchase Beano. Sprinkle Beano on the first bite of food before eating to reduce gas. It has no flavor and should not change the taste of your food. You can buy Beano over the counter at your local drugstore.  Foods like fish, onions, garlic, broccoli, asparagus, and cabbage produce odor. Although your pouch is odor-proof, if you eat these foods you may notice a stronger odor when emptying your pouch. If this is a concern, you may want to limit these foods in your diet.  If you have an ileostomy, you will have chronic diarrhea & need to drink more liquids to avoid getting dehydrated.  Consider antidiarrheal medicine like imodium (loperamide) or Lomotil to help slow down bowel movements / diarrhea into your ileostomy bag.  GETTING TO GOOD BOWEL HEALTH WITH AN ILEOSTOMY    With the colon bypassed & not in use, you will have small bowel diarrhea.   It is important to thicken & slow your bowel movements down.   The goal: 4-6 small BOWEL MOVEMENTS A DAY It is important to drink plenty of liquids to avoid  getting dehydrated  CONTROLLING ILEOSTOMY DIARRHEA  TAKE A FIBER SUPPLEMENT (FiberCon or Benefiner soluble fiber) twice a day - to thicken stools by absorbing excess fluid and retrain the intestines to act more normally.  Slowly increase the dose over a few weeks.  Too much fiber too soon can backfire and cause cramping & bloating.  TAKE AN IRON SUPPLEMENT twice a day to naturally constipate your bowels.  Usually  ferrous sulfate 354m twice a day)  TAKE ANTI-DIARRHEAL MEDICINES: Loperamide (Imodium) can slow down diarrhea.  Start with two tablets (= 435m first and then try one tablet every 6 hours.  Can go up to 2 pills four times day (8 pills of 27m75max) Avoid if you are having fevers or severe pain.  If you are not better or start feeling worse, stop all medicines and call your doctor for advice LoMotil (Diphenoxylate / Atropine) is another medicine that can constipate & slow down bowel moevements Pepto Bismol (bismuth) can gently thicken bowels as well  If diarrhea is worse,: drink plenty of liquids and try simpler foods for a few days to avoid stressing your intestines further. Avoid dairy products (especially milk & ice cream) for a short time.  The intestines often can lose the ability to digest lactose when stressed. Avoid foods that cause gassiness or bloating.  Typical foods include beans and other legumes, cabbage, broccoli, and dairy foods.  Every person has some sensitivity to other foods, so listen to our body and avoid those foods that trigger problems for you.Call your doctor if you are getting worse or not better.  Sometimes further testing (cultures, endoscopy, X-ray studies, bloodwork, etc) may be needed to help diagnose and treat the cause of the diarrhea. Take extra anti-diarrheal medicines (maximum is 8 pills of 27mg427mperamide a day)   Tips for POUCHING an OSTOMY   Changing Your Pouch The best time to change your pouch is in the morning, before eating or drinking anything. Your stoma can function at any time, but it will function more after eating or drinking.   Applying the pouching system  Place all your equipment close at hand before removing your pouch.  Wash your hands.  Stand or sit in front of a mirror. Use the position that works best for you. Remember that you must keep the skin around the stoma wrinkle-free for a good seal.  Gently remove the used pouch (1-piece  system) or the pouch and old wafer (2-piece system). Empty the pouch into the toilet. Save the closure clip to use again.  Wash the stoma itself and the skin around the stoma. Your stoma may bleed a little when being washed. This is normal. Rinse and pat dry. You may use a wash cloth or soft paper towels (like Bounty), mild soap (like Dial, Safeguard, or IvorMongoliand water. Avoid soaps that contain perfumes or lotions.  For a new pouch (1-piece system) or a new wafer (2-piece system), measure your stoma using the stoma guide in each box of supplies.  Trace the shape of your stoma onto the back of the new pouch or the back of the new wafer. Cut out the opening. Remove the paper backing and set it aside.  Optional: Apply a skin barrier powder to surrounding skin if it is irritated (bare or weeping), and dust off the excess. Optional: Apply a skin-prep wipe (such as Skin Prep or All-Kare) to the skin around the stoma, and let it dry. Do not apply this solution if the  skin is irritated (red, tender, or broken) or if you have shaved around the stoma. Optional: Apply a skin barrier paste (such as Stomahesive, Coloplast, or Premium) around the opening cut in the back of the pouch or wafer. Allow it to dry for 30 to 60 seconds.  Hold the pouch (1-piece system) or wafer (2-piece system) with the sticky side toward your body. Make sure the skin around the stoma is wrinkle-free. Center the opening on the stoma, then press firmly to your abdomen (Fig. 4). Look in the mirror to check if you are placing the pouch, or wafer, in the right position. For a 2-piece system, snap the pouch onto the wafer. Make sure it snaps into place securely.  Place your hand over the stoma and the pouch or wafer for about 30 seconds. The heat from your hand can help the pouch or wafer stick to your skin.  Add deodorant (such as Super Banish or Nullo) to your pouch. Other options include food extracts such as vanilla oil and peppermint  extract. Add about 10 drops of the deodorant to the pouch. Then apply the closure clamp. Note: Do not use toxic  chemicals or commercial cleaning agents in your pouch. These substances may harm the stoma.  Optional: For extra seal, apply tape to all 4 sides around the pouch or wafer, as if you were framing a picture. You may use any brand of medical adhesive tape. Change your pouch every 5 to 7 days. Change it immediately if a leak occurs.  Wash your hands afterwards.  If you are wearing a 2-piece system, you may use 2 new pouches per week and alternate them. Rinse the pouch with mild soap and warm water and hang it to dry for the next day. Apply the fresh pouch. Alternate the 2 pouches like this for a week. After a week, change the wafer and begin with 2 new pouches. Place the old pouches in a plastic bag, and put them in the trash.   LIVING WITH AN OSTOMY  Emptying Your Pouch Empty your pouch when it is one-third full (of urine, stool, and/or gas). If you wait until your pouch is fuller than this, it will be more difficult to empty and more noticeable. When you empty your pouch, either put toilet paper in the toilet bowl first, or flush the toilet while you empty the pouch. This will reduce splashing. You can empty the pouch between your legs or to one side while sitting, or while standing or stooping. If you have a 2-piece system, you can snap off the pouch to empty it. Remember that your stoma may function during this time. If you wish to rinse your pouch after you empty it, a [place] baster can be helpful. When using a baster, squirt water up into the pouch through the opening at the bottom. With a 2-piece system, you can snap off the pouch to rinse it. After rinsing  your pouch, empty it into the toilet. When rinsing your pouch at home, put a few granules of Dreft soap in the rinse water. This helps lubricate and freshen your pouch. The inside of your pouch can be sprayed with non-stick cooking  oil (Pam spray). This may help reduce stool sticking to the inside of the pouch.  Bathing You may shower or bathe with your pouch on or off. Remember that your stoma may function during this time.  The materials you use to wash your stoma and the skin around it should be  clean, but they do not need to be sterile.  Wearing Your Pouch During hot weather, or if you perspire a lot in general, wear a cover over your pouch. This may prevent a rash on your skin under the pouch. Pouch covers are sold at ostomy supply stores. Wear the pouch inside your underwear for better support. Watch your weight. Any gain or loss of 10 to 15 pounds or more can change the way your pouch fits.  Going Away From Home A collapsible cup (like those that come in travel kits) or a soft plastic squirt bottle with a pull-up top (like a travel bottle for shampoo) can be used for rinsing your pouch when you are away from home. Tilt the opening of the pouch at an upward angle when using a cup to rinse.  Carry wet wipes or extra tissues to use in public bathrooms.  Carry an extra pouching system with you at all times.  Never keep ostomy supplies in the glove compartment of your car. Extreme heat or cold can damage the skin barriers and adhesive wafers on the pouch.  When you travel, carry your ostomy supplies with you at all times. Keep them within easy reach. Do not pack ostomy supplies in baggage that will be checked or otherwise separated from you, because your baggage might be lost. If youre traveling out of the country, it is helpful to have a letter stating that you are carrying ostomy supplies as a medical necessity.  If you need ostomy supplies while traveling, look in the yellow pages of the telephone book under Surgical Supplies. Or call the local ostomy organization to find out where supplies are available.  Do not let your ostomy supplies get low. Always order new pouches before you use the last one.  Reducing  Odor Limit foods such as broccoli, cabbage, onions, fish, and garlic in your diet to help reduce odor. Each time you empty your pouch, carefully clean the opening of the pouch, both inside and outside, with toilet paper. Rinse your pouch 1 or 2 times daily after you empty it (see directions for emptying your pouch and going away from home). Add deodorant (such as Super Banish or Nullo) to your pouch. Use air deodorizers in your bathroom. Do not add aspirin to your pouch. Even though aspirin can help prevent odor, it could cause ulcers on your stoma.  When to call the doctor Call the doctor if you have any of the following symptoms: Purple, black, or white stoma Severe cramps lasting more than 6 hours Severe watery discharge from the stoma lasting more than 6 hours No output from the colostomy for 3 days Excessive bleeding from your stoma Swelling of your stoma to more than 1/2-inch larger than usual Pulling inward of your stoma below skin level Severe skin irritation or deep ulcers Bulging or other changes in your abdomen  When to call your ostomy nurse Call your ostomy/enterostomal therapy (WOCN) nurse if any of the following occurs: Frequent leaking of your pouching system Change in size or appearance of your stoma, causing discomfort or problems with your pouch Skin rash or rawness Weight gain or loss that causes problems with your pouch     FREQUENTLY ASKED QUESTIONS   Why havent you met any of these folks who have an ostomy?  Well, maybe you have! You just did not recognize them because an ostomy doesn't show. It can be kept secret if you wish. Why, maybe some of your best friends, office  associates or neighbors have an ostomy ... you never can tell. People facing ostomy surgery have many quality-of-life questions like: Will you bulge? Smell? Make noises? Will you feel waste leaving your body? Will you be a captive of the toilet? Will you starve? Be a social outcast? Get/stay  married? Have babies? Easily bathe, go swimming, bend over?  OK, lets look at what you can expect:   Will you bulge?  Remember, without part of the intestine or bladder, and its contents, you should have a flatter tummy than before. You can expect to wear, with little exception, what you wore before surgery ... and this in-cludes tight clothing and bathing suits.   Will you smell?  Today, thanks to modern odor proof pouching systems, you can walk into an ostomy support group meeting and not smell anything that is foul or offensive. And, for those with an ileostomy or colostomy who are concerned about odor when emptying their pouch, there are in-pouch deodorants that can be used to eliminate any waste odors that may exist.   Will you make noises?  Everyone produces gas, especially if they are an air-swallower. But intestinal sounds that occur from time to time are no differ-ent than a gurgling tummy, and quite often your clothing will muffle any sounds.   Will you feel the waste discharges?  For those with a colostomy or ileostomy there might be a slight pressure when waste leaves your body, but understand that the intestines have no nerve endings, so there will be no unpleasant sensations. Those with a urostomy will probably be unaware of any kidney drainage.   Will you be a captive of the toilet?  Immediately post-op you will spend more time in the bathroom than you will after your body recovers from surgery. Every person is different, but on average those with an ileostomy or urostomy may empty their pouches 4 to 6 times a day; a little  less if you have a colostomy. The average wear time between pouch system changes is 3 to 5 days and the changing process should take less than 30 minutes.   Will I need to be on a special diet? Most people return to their normal diet when they have recovered from surgery. Be sure to chew your food well, eat a well-balanced diet and drink plenty of fluids. If  you experience problems with a certain food, wait a couple of weeks and try it again.  Will there be odor and noises? Pouching systems are designed to be odor-proof or odor-resistant. There are deodorants that can be used in the pouch. Medications are also available to help reduce odor. Limit gas-producing foods and carbonated beverages. You will experience less gas and fewer noises as you heal from surgery.  How much time will it take to care for my ostomy? At first, you may spend a lot of time learning about your ostomy and how to take care of it. As you become more comfortable and skilled at changing the pouching system, it will take very little time to care for it.   Will I be able to return to work? People with ostomies can perform most jobs. As soon as you have healed from surgery, you should be able to return to work. Heavy lifting (more than 10 pounds) may be discouraged.   What about intimacy? Sexual relationships and intimacy are important and fulfilling aspects of your life. They should continue after ostomy surgery. Intimacy-related concerns should be discussed openly between you and  your partner.   Can I wear regular clothing? You do not need to wear special clothing. Ostomy pouches are fairly flat and barely noticeable. Elastic undergarments will not hurt the stoma or prevent the ostomy from functioning.   Can I participate in sports? An ostomy should not limit your involvement in sports. Many people with ostomies are runners, skiers, swimmers or participate in other active lifestyles. Talk with your caregiver first before doing heavy physical activity.  Will you starve?  Not if you follow doctors orders at each stage of your post-op adjustment. There is no such thing as an ostomy diet. Some people with an ostomy will be able to eat and tolerate anything; others may find diffi-culty with some foods. Each person is an individual and must determine, by trial, what is best for  them. A good practice for all is to drink plenty of water.   Will you be a social outcast?  Have you met anyone who has an ostomy and is a social outcast? Why should you be the first? Only your attitude and self image will effect how you are treated. No confi-dent person is an Occupational psychologist.    PROFESSIONAL HELP   Resources are available if you need help or have questions about your ostomy.   Specially trained nurses called Wound, Ostomy Continence Nurses (WOCN) are available for consultation in most major medical centers.  Consider getting an ostomy consult at an outpatient ostomy clinic.   [facility] has an [facility] run by an Programmer, systems at the [facility].  [phone]. [facility] can help set up an appointment   The [employer] (UOA) is a group made up of many local chapters throughout the [place]. These local groups hold meetings and provide support to prospective and existing ostomates. They sponsor educational events and have qualified visitors to make personal or telephone visits. Contact the UOA for the chapter nearest you and for other educational publications.  More detailed information can be found in Colostomy Guide, a publication of the [employer] (UOA). Contact UOA at 1-5137107504 or visit their web site at [email or web address] The website contains links to other sites, suppliers and resources.  Tree surgeon Start Services: Start at the website to enlist for support.  Your Wound Ostomy (WOCN) nurse may have started this process. https://www.hollister.com/en/securestart Secure Start services are designed to support people as they live their lives with an ostomy or neurogenic bladder. Enrolling is easy and at no cost to the patient. We realize that each person's needs and life journey are different. Through Secure Start services, we want to help people live their life, their  way.  #######################################################    WOUND CARE  It is important that the wound be kept open.   -Keeping the skin edges apart will allow the wound to gradually heal from the base upwards.   - If the skin edges of the wound close too early, a new fluid pocket can form and infection can occur. -This is the reason to pack deeper wounds with gauze or ribbon -This is why drained wounds cannot be sewed closed right away  A healthy wound should form a lining of bright red "beefy" granulating tissue that will help shrink the wound and help the edges grow new skin into it.   -A little mucus / yellow discharge is normal (the body's natural way to try and form a scab) and should be gently washed off with soap and water with  daily dressing changes.  -Green or foul smelling drainage implies bacterial colonization and can slow wound healing - a short course of antibiotic ointment (3-5 days) can help it clear up.  Call the doctor if it does not improve or worsens  -Avoid use of antibiotic ointments for more than a week as they can slow wound healing over time.    -Sometimes other wound care products will be used to reduce need for dressing changes and/or help clean up dirty wounds -Sometimes the surgeon needs to debride the wound in the office to remove dead or infected tissue out of the wound so it can heal more quickly and safely.    Change the dressing at least once a day -Wash the wound with mild soap and water gently every day.  It is good to shower or bathe the wound to help it clean out. -Use clean 4x4 gauze for medium/large wounds or ribbon plain NU-gauze for smaller wounds (it does not need to be sterile, just clean) -Keep the raw wound moist with a little saline or KY (saline) gel on the gauze.  -A dry wound will take longer to heal.  -Keep the skin dry around the wound to prevent breakdown and irritation. -Pack the wound down to the base -The goal is to keep the  skin apart, not overpack the wound -Use a Q-tip or blunt-tipped kabob stick toothpick to push the gauze down to the base in narrow or deep wounds   -Cover with a clean gauze and tape -paper or Medipore tape tend to be gentle on the skin -rotate the orientation of the tape to avoid repeated stress/trauma on the skin -using an ACE or Coban wrap on wounds on arms or legs can be used instead.  Complete all antibiotics through the entire prescription to help the infection heal and prevent new places of infection   Returning the see the surgeon is helpful to follow the healing process and help the wound close as fast as possible.   ################################   SURGERY: POST OP INSTRUCTIONS (Surgery for small bowel obstruction, colon resection, etc)   ######################################################################  EAT Gradually transition to a high fiber diet with a fiber supplement over the next few days after discharge  WALK Walk an hour a day.  Control your pain to do that.    CONTROL PAIN Control pain so that you can walk, sleep, tolerate sneezing/coughing, go up/down stairs.  HAVE A BOWEL MOVEMENT DAILY Keep your bowels regular to avoid problems.  OK to try a laxative to override constipation.  OK to use an antidairrheal to slow down diarrhea.  Call if not better after 2 tries  CALL IF YOU HAVE PROBLEMS/CONCERNS Call if you are still struggling despite following these instructions. Call if you have concerns not answered by these instructions  ######################################################################   DIET Follow a light diet the first few days at home.  Start with a bland diet such as soups, liquids, starchy foods, low fat foods, etc.  If you feel full, bloated, or constipated, stay on a ful liquid or pureed/blenderized diet for a few days until you feel better and no longer constipated. Be sure to drink plenty of fluids every day to avoid getting  dehydrated (feeling dizzy, not urinating, etc.). Gradually add a fiber supplement to your diet over the next week.  Gradually get back to a regular solid diet.  Avoid fast food or heavy meals the first week as you are more likely to get nauseated. It is  expected for your digestive tract to need a few months to get back to normal.  It is common for your bowel movements and stools to be irregular.  You will have occasional bloating and cramping that should eventually fade away.  Until you are eating solid food normally, off all pain medications, and back to regular activities; your bowels will not be normal. Focus on eating a low-fat, high fiber diet the rest of your life (See Getting to [place], below).  CARE of your INCISION or WOUND   It is good for even open wounds to be washed every day.  Shower every day.  Short baths are fine.  Wash the incisions and wounds clean with soap & water.    You may leave closed incisions open to air if it is dry.   You may cover the incision with clean gauze & replace it after your daily shower for comfort.       ACTIVITIES as tolerated Start light daily activities --- self-care, walking, climbing stairs-- beginning the day after surgery.  Gradually increase activities as tolerated.  Control your pain to be active.  Stop when you are tired.  Ideally, walk several times a day, eventually an hour a day.   Most people are back to most day-to-day activities in a few weeks.  It takes 4-8 weeks to get back to unrestricted, intense activity. If you can walk 30 minutes without difficulty, it is safe to try more intense activity such as jogging, treadmill, bicycling, low-impact aerobics, swimming, etc. Save the most intensive and strenuous activity for last (Usually 4-8 weeks after surgery) such as sit-ups, heavy lifting, contact sports, etc.  Refrain from any intense heavy lifting or straining until you are off narcotics for pain control.  You will have off  days, but things should improve week-by-week. DO NOT PUSH THROUGH PAIN.  Let pain be your guide: If it hurts to do something, don't do it.  Pain is your body warning you to avoid that activity for another week until the pain goes down. You may drive when you are no longer taking narcotic prescription pain medication, you can comfortably wear a seatbelt, and you can safely make sudden turns/stops to protect yourself without hesitating due to pain. You may have sexual intercourse when it is comfortable. If it hurts to do something, stop.  MEDICATIONS Take your usually prescribed home medications unless otherwise directed.   Blood thinners:  Usually you can restart any strong blood thinners after the second postoperative day.  It is OK to take aspirin right away.     If you are on strong blood thinners (warfarin/Coumadin, Plavix, Xerelto, Eliquis, Pradaxa, etc), discuss with your surgeon, medicine PCP, and/or cardiologist for instructions on when to restart the blood thinner & if blood monitoring is needed (PT/INR blood check, etc).     PAIN CONTROL Pain after surgery or related to activity is often due to strain/injury to muscle, tendon, nerves and/or incisions.  This pain is usually short-term and will improve in a few months.  To help speed the process of healing and to get back to regular activity more quickly, DO THE FOLLOWING THINGS TOGETHER: Increase activity gradually.  DO NOT PUSH THROUGH PAIN Use Ice and/or Heat Try Gentle Massage and/or Stretching Take over the counter pain medication Take Narcotic prescription pain medication for more severe pain  Good pain control = faster recovery.  It is better to take more medicine to be more active than to stay  in bed all day to avoid medications.  Increase activity gradually Avoid heavy lifting at first, then increase to lifting as tolerated over the next 6 weeks. Do not push through the pain.  Listen to your body and avoid positions and  maneuvers than reproduce the pain.  Wait a few days before trying something more intense Walking an hour a day is encouraged to help your body recover faster and more safely.  Start slowly and stop when getting sore.  If you can walk 30 minutes without stopping or pain, you can try more intense activity (running, jogging, aerobics, cycling, swimming, treadmill, sex, sports, weightlifting, etc.) Remember: If it hurts to do it, then dont do it! Use Ice and/or Heat You will have swelling and bruising around the incisions.  This will take several weeks to resolve. Ice packs or heating pads (6-8 times a day, 30-60 minutes at a time) will help sooth soreness & bruising. Some people prefer to use ice alone, heat alone, or alternate between ice & heat.  Experiment and see what works best for you.  Consider trying ice for the first few days to help decrease swelling and bruising; then, switch to heat to help relax sore spots and speed recovery. Shower every day.  Short baths are fine.  It feels good!  Keep the incisions and wounds clean with soap & water.   Try Gentle Massage and/or Stretching Massage at the area of pain many times a day Stop if you feel pain - do not overdo it Take over the counter pain medication This helps the muscle and nerve tissues become less irritable and calm down faster Choose ONE of the following over-the-counter anti-inflammatory medications: Acetaminophen 554m tabs (Tylenol) 1-2 pills with every meal and just before bedtime (avoid if you have liver problems or if you have acetaminophen in you narcotic prescription) Naproxen 2236mtabs (ex. Aleve, Naprosyn) 1-2 pills twice a day (avoid if you have kidney, stomach, IBD, or bleeding problems) Ibuprofen 20056mabs (ex. Advil, Motrin) 3-4 pills with every meal and just before bedtime (avoid if you have kidney, stomach, IBD, or bleeding problems) Take with food/snack several times a day as directed for at least 2 weeks to help keep  pain / soreness down & more manageable. Take Narcotic prescription pain medication for more severe pain A prescription for strong pain control is often given to you upon discharge (for example: oxycodone/Percocet, hydrocodone/Norco/Vicodin, or tramadol/Ultram) Take your pain medication as prescribed. Be mindful that most narcotic prescriptions contain Tylenol (acetaminophen) as well - avoid taking too much Tylenol. If you are having problems/concerns with the prescription medicine (does not control pain, nausea, vomiting, rash, itching, etc.), please call us [phone] see if we need to switch you to a different pain medicine that will work better for you and/or control your side effects better. If you need a refill on your pain medication, you must call the office before 4 pm and on weekdays only.  By federal law, prescriptions for narcotics cannot be called into a pharmacy.  They must be filled out on paper & picked up from our office by the patient or authorized caretaker.  Prescriptions cannot be filled after 4 pm nor on weekends.    WHEN TO CALL US [phone] uncontrolled or worsening pain  Fever over 101 F (38.5 C) Concerns with the incision: Worsening pain, redness, rash/hives, swelling, bleeding, or drainage Reactions / problems with new medications (itching, rash, hives, nausea, etc.) Nausea and/or vomiting  Difficulty urinating Difficulty breathing Worsening fatigue, dizziness, lightheadedness, blurred vision Other concerns If you are not getting better after two weeks or are noticing you are getting worse, contact our office [phone] for further advice.  We may need to adjust your medications, re-evaluate you in the office, send you to the emergency room, or see what other things we can do to help. The clinic staff is available to answer your questions during regular business hours (8:30am-5pm).  Please dont hesitate to call and ask to speak to one of our nurses  for clinical concerns.    A surgeon from [facility] is always on call at the hospitals 24 hours/day If you have a medical emergency, go to the nearest emergency room or call 911.  FOLLOW UP in our office One the day of your discharge from the hospital (or the next business weekday), please call [facility] to set up or confirm an appointment to see your surgeon in the office for a follow-up appointment.  Usually it is 2-3 weeks after your surgery.   If you have skin staples at your incision(s), let the office know so we can set up a time in the office for the nurse to remove them (usually around 10 days after surgery). Make sure that you call for appointments the day of discharge (or the next business weekday) from the hospital to ensure a convenient appointment time. IF YOU HAVE DISABILITY OR FAMILY LEAVE FORMS, BRING THEM TO THE OFFICE FOR PROCESSING.  DO NOT GIVE THEM TO YOUR DOCTOR.  [facility] [address], [place], [place], [place]  [address] ? [phone] - Main [phone] - [place],  [phone] - Fax [email or web address]  GETTING TO GOOD BOWEL HEALTH. It is expected for your digestive tract to need a few months to get back to normal.  It is common for your bowel movements and stools to be irregular.  You will have occasional bloating and cramping that should eventually fade away.  Until you are eating solid food normally, off all pain medications, and back to regular activities; your bowels will not be normal.   Avoiding constipation The goal: ONE SOFT BOWEL MOVEMENT A DAY!    Drink plenty of fluids.  Choose water first. TAKE A FIBER SUPPLEMENT EVERY DAY THE REST OF YOUR LIFE During your first week back home, gradually add back a fiber supplement every day Experiment which form you can tolerate.   There are many forms such as powders, tablets, wafers, gummies, etc Psyllium bran (Metamucil), methylcellulose  (Citrucel), Miralax or Glycolax, Benefiber, Flax Seed.  Adjust the dose week-by-week (1/2 dose/day to 6 doses a day) until you are moving your bowels 1-2 times a day.  Cut back the dose or try a different fiber product if it is giving you problems such as diarrhea or bloating. Sometimes a laxative is needed to help jump-start bowels if constipated until the fiber supplement can help regulate your bowels.  If you are tolerating eating & you are farting, it is okay to try a gentle laxative such as double dose MiraLax, prune juice, or Milk of Magnesia.  Avoid using laxatives too often. Stool softeners can sometimes help counteract the constipating effects of narcotic pain medicines.  It can also cause diarrhea, so avoid using for too long. If you are still constipated despite taking fiber daily, eating solids, and a few doses of laxatives, call our office. Controlling diarrhea Try drinking liquids and eating bland  foods for a few days to avoid stressing your intestines further. Avoid dairy products (especially milk & ice cream) for a short time.  The intestines often can lose the ability to digest lactose when stressed. Avoid foods that cause gassiness or bloating.  Typical foods include beans and other legumes, cabbage, broccoli, and dairy foods.  Avoid greasy, spicy, fast foods.  Every person has some sensitivity to other foods, so listen to your body and avoid those foods that trigger problems for you. Probiotics (such as active yogurt, Align, etc) may help repopulate the intestines and colon with normal bacteria and calm down a sensitive digestive tract Adding a fiber supplement gradually can help thicken stools by absorbing excess fluid and retrain the intestines to act more normally.  Slowly increase the dose over a few weeks.  Too much fiber too soon can backfire and cause cramping & bloating. It is okay to try and slow down diarrhea with a few doses of antidiarrheal medicines.   Bismuth  subsalicylate (ex. Kayopectate, Pepto Bismol) for a few doses can help control diarrhea.  Avoid if pregnant.   Loperamide (Imodium) can slow down diarrhea.  Start with one tablet (69m) first.  Avoid if you are having fevers or severe pain.  ILEOSTOMY PATIENTS WILL HAVE CHRONIC DIARRHEA since their colon is not in use.    Drink plenty of liquids.  You will need to drink even more glasses of water/liquid a day to avoid getting dehydrated. Record output from your ileostomy.  Expect to empty the bag every 3-4 hours at first.  Most people with a permanent ileostomy empty their bag 4-6 times at the least.   Use antidiarrheal medicine (especially Imodium) several times a day to avoid getting dehydrated.  Start with a dose at bedtime & breakfast.  Adjust up or down as needed.  Increase antidiarrheal medications as directed to avoid emptying the bag more than 8 times a day (every 3 hours). Work with your wound ostomy nurse to learn care for your ostomy.  See ostomy care instructions. TROUBLESHOOTING IRREGULAR BOWELS 1) Start with a soft & bland diet. No spicy, greasy, or fried foods.  2) Avoid gluten/wheat or dairy products from diet to see if symptoms improve. 3) Miralax 17gm or flax seed mixed in [address] water or juice-daily. May use 2-4 times a day as needed. 4) Gas-X, Phazyme, etc. as needed for gas & bloating.  5) Prilosec (omeprazole) over-the-counter as needed 6)  Consider probiotics (Align, Activa, etc) to help calm the bowels down  Call your doctor if you are getting worse or not getting better.  Sometimes further testing (cultures, endoscopy, X-ray studies, CT scans, bloodwork, etc.) may be needed to help diagnose and treat the cause of the diarrhea. [facility], [place]  [phone]- Main.    [phone] - Toll Free.   [phone] Fax [email or web address]

## 2021-06-30 NOTE — Consult Note (Signed)
[facility] Nurse ostomy follow up Stoma type/location: LLQ colostomy.  POD 3. Dr. Thermon Leyland in to see at time of my assessment. Daughter with patient and very engaged. She will also be a support to patient (along with patient's husband). Patient is independent in emptying. Stomal assessment/size: 1 and 3/8 inch oval, red, slightly raised. Lumen in center. Peristomal assessment: intact, clear Treatment options for stomal/peristomal skin: skin barrier ring Output: flatus, small amt mucus and stool-colored liquid.  Ostomy pouching: 2pc. 2 and 1/4 inch pouches, skin barrier ring Education provided:   Demonstrated pouch change (cutting new skin barrier, measuring stoma, cleaning peristomal skin and stoma, use of barrier ring). Patient able to give return demon of pouch removal, pouch preparation (with cueing) and pouch application (with cueing).  Discussed belt tabs/belt and a sample of a belt is coming via [employer].  Discussed opaque pouches, pouches with integrated gas filters.  Both samples have been requested of [employer]. Discussed obtaining more supplies and the role that [employer] will play in that. Discussed pouch change frequency of every 3-4 days (twice weekly). Discussed bathing, diet, gas, medication use, constipation  Answered patient/family questions. Patient with increasing confidence.  Will begin regular diet today.     Enrolled patient in [facility] Discharge program: Yes  [facility] nursing team will follow, and will remain available to this patient, the nursing and medical teams.   Thanks, Maudie Flakes, MSN, RN, [place], [place] Abbott  Pager# [phone]

## 2021-07-01 LAB — RESP PANEL BY RT-PCR (FLU A&B, COVID) ARPGX2
Influenza A by PCR: NEGATIVE
Influenza B by PCR: NEGATIVE
SARS Coronavirus 2 by RT PCR: NEGATIVE

## 2021-07-01 MED ORDER — PANTOPRAZOLE SODIUM 40 MG PO TBEC
40.0000 mg | DELAYED_RELEASE_TABLET | Freq: Every day | ORAL | Status: DC
  Administered 2021-07-01 – 2021-07-02 (×2): 40 mg via ORAL
  Filled 2021-07-01 (×2): qty 1

## 2021-07-01 NOTE — Progress Notes (Signed)
4 Days Post-Op  Subjective: CC: Doing well. Not having much abdominal pain. Some soreness of her abdomen. Tolerating soft diet without n/v. Some air in ostomy bag, no stool. Mobilizing well. Voiding.   She plans for her daughter to help with WTD dressings at home. She states her daughter is an Therapist, sports and familiar with how to perform these. Her husband was just dx with covid. Pt is neg for covid here.   Objective: Vital signs in last 24 hours: Temp:  [97.8 F (36.6 C)-98.3 F (36.8 C)] 98.3 F (36.8 C) (02/03 0518) Pulse Rate:  [55-61] 55 (02/03 0518) Resp:  [16-18] 16 (02/03 0518) BP: (133-159)/(60-72) 133/60 (02/03 0518) SpO2:  [90 %-100 %] 90 % (02/03 0518) Last BM Date: 06/29/21  Intake/Output from previous day: 02/02 0701 - 02/03 0700 In: 1535.1 [P.O.:1440; IV Piggyback:95.1] Out: 2500 [Urine:2500] Intake/Output this shift: Total I/O In: 240 [P.O.:240] Out: 200 [Urine:200]  PE: Gen:  Alert, NAD, pleasant Card:  Reg Pulm:  Normal rate and effort, on room air Abd: Soft, ND, appropriately tender, +BS. Midline wound clean. Stoma viable. Some air in ostomy bag, no stool  Ext:  No LE edema  Psych: A&Ox3  Skin: no rashes noted, warm and dry  Lab Results:  Recent Labs    06/29/21 0424 06/30/21 0428  WBC 7.2 4.8  HGB 9.4* 9.9*  HCT 29.1* 30.7*  PLT 174 168   BMET Recent Labs    06/29/21 0424 06/30/21 0428  NA 141 136  K 3.9 3.6  CL 116* 108  CO2 21* 24  GLUCOSE 135* 94  BUN 13 10  CREATININE 0.81 0.80  CALCIUM 8.0* 8.0*   PT/INR No results for input(s): LABPROT, INR in the last 72 hours. CMP     Component Value Date/Time   NA 136 06/30/2021 0428   K 3.6 06/30/2021 0428   CL 108 06/30/2021 0428   CO2 24 06/30/2021 0428   GLUCOSE 94 06/30/2021 0428   BUN 10 06/30/2021 0428   CREATININE 0.80 06/30/2021 0428   CALCIUM 8.0 (L) 06/30/2021 0428   PROT 6.7 06/27/2021 1323   ALBUMIN 3.6 06/27/2021 1323   AST 22 06/27/2021 1323   ALT 17 06/27/2021 1323    ALKPHOS 52 06/27/2021 1323   BILITOT 0.9 06/27/2021 1323   GFRNONAA >60 06/30/2021 0428   Lipase     Component Value Date/Time   LIPASE 28 06/27/2021 1323    Studies/Results: No results found.  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 Autumn Messing - 06/27/2021 - Tolerating diet. Some air in ostomy, no stool - Cont 5d abx post op - Continue BID WTD to midline wound. Patient plans for her daughter who is an Therapist, sports to help at home.  - WOCN following for new ostomy. - Path with diverticulitis. Discussed with patient  - Pulm toilet - [place] - home once she has a bowel movement,  she's doing well   FEN - Reg VTE - SCDs, subq heparin ID - Zosyn 1/30 >> Foley - removed 1/31, voiding    HTN - home meds + IV PRN GERD  - PPI L ovarian cyst - will need outpatient f/u   This care required moderate level of medical decision making. Patient is post op   LOS: 4 days    Jillyn Ledger , [facility] 07/01/2021, 10:57 AM Please see Amion for pager number during day hours 7:00am-4:30pm

## 2021-07-02 MED ORDER — ALUM & MAG HYDROXIDE-SIMETH 200-200-20 MG/5ML PO SUSP
30.0000 mL | Freq: Four times a day (QID) | ORAL | Status: DC | PRN
  Administered 2021-07-02: 30 mL via ORAL
  Filled 2021-07-02: qty 30

## 2021-07-02 MED ORDER — TRAMADOL HCL 50 MG PO TABS: 50.0000 mg | ORAL_TABLET | Freq: Four times a day (QID) | ORAL | 0 refills | Status: DC | PRN

## 2021-07-02 NOTE — Progress Notes (Signed)
Instructed daughter and pt on how to do abd dsg change. Watched daughter change pt's ostomy appliance, questions answered and good technique observed. They both verbalized understanding. Reviewed written d/c instructions w pt and her daughter and all questions answered. They again verbalized understanding. D/C via w/c w all belongings in stable condition.

## 2021-07-02 NOTE — Discharge Summary (Signed)
Physician Discharge Summary    Patient ID: Carla Ramirez MRN: [id number] DOB/AGE: 06-12-1950  71 y.o.  Patient Care Team: Jonathon Jordan, MD as PCP - General (Family Medicine) Megan Salon, MD as Consulting Physician (Gynecology)  Admit date: 06/27/2021  Discharge date: 07/02/2021  Hospital Stay = 5 days    Discharge Diagnoses:  Principal Problem:   Pneumoperitoneum Active Problems:   Perforation of sigmoid colon due to diverticulitis   5 Days Post-Op  06/27/2021  POST-OPERATIVE DIAGNOSIS:   PERFORATED SIGMOID COLON, DIVERTICULITIS  SURGERY:  06/27/2021  Procedure(s): EXPLORATORY LAPAROTOMY, SIGMOID COLECTOMY WITH COLOSTOMY  SURGEON:    Surgeon(s): Jovita Kussmaul, MD  Consults: Case Management / Social Work.  Wound ostomy consulting nurse  Hospital Course:   Pleasant woman who came to emergency room with free air and found to have pneumoperitoneum.  Found to have perforated colon.  Underwent colectomy with [facility] resection.  Placed on IV antibiotics.  Wound left open.  Patient stabilized.  Postoperatively, the patient gradually mobilized and advanced to a solid diet.  Pain and other symptoms were treated aggressively.    By the time of discharge, the patient was walking well the hallways, eating food, having flatus.  Had colostomy training as well as wound care.  Completed 5 days of IV piperacillin/tazobactam antibiotics.  Improved.  Patient pain was well-controlled on an oral medications.  Based on meeting discharge criteria and continuing to recover, I felt it was safe for the patient to be discharged from the hospital to further recover with close followup.  Home health wound care as well as colostomy care.  Work to establish with outpatient ostomy clinic as well.  Close follow-up in the office.  Postoperative recommendations were discussed in detail to the patient and her daughter.  Patient's husband has COVID so she will be living with her daughter in the  short-term until it is safe for them to reconnect.  Instructions discussed and written as well.  Discharged Condition:   Discharge Exam: Blood pressure (!) 174/72, pulse (!) 54, temperature 97.8 F (36.6 C), temperature source Oral, resp. rate 18, height 5' 1.5" (1.562 m), weight 63 kg, last menstrual period 05/30/2003, SpO2 95 %.  General: Pt awake/alert/oriented x4 in No acute distress.  Sitting up in chair.  Bright and alert.  Inquisitive Eyes: PERRL, normal EOM.  Sclera clear.  No icterus Neuro: CN II-XII intact w/o focal sensory/motor deficits. Lymph: No head/neck/groin lymphadenopathy Psych:  No delerium/psychosis/paranoia.  Mildly anxious but consolable HENT: Normocephalic, Mucus membranes moist.  No thrush Neck: Supple, No tracheal deviation Chest:  No chest wall pain w good excursion CV:  Pulses intact.  Regular rhythm MS: Normal AROM mjr joints.  No obvious deformity Abdomen: Soft.  Nondistended.  Mildly tender at incisions only.  Midline wound shallow with decent granulation tissue and minimal tissue necrosis.  Left-sided colostomy pink with gas and thick but liquid stool in bag.  No evidence of peritonitis.  No incarcerated hernias. Ext:  SCDs BLE.  No mjr edema.  No cyanosis Skin: No petechiae / purpura   Disposition:    Follow-up Information     Care, [facility] Follow up.   Specialty: Home Health Services Why: RN for ostomy needs. Contact information: [address] [place] [address] [phone]         Jovita Kussmaul, MD. Go on 07/20/2021.   Specialty: General Surgery Why: 9:40 AM. Please arrive 30 min prior to appointment time for check in.  Bring ID and insurance information with you. Contact information: [address] [place] [address] [phone]         [facility] Follow up in 3 day(s).   Specialty: General Surgery Why: TO GET OSTOMY CARE / TRAINING AT [place]  information: [address] [id number] [place] [phone]                Discharge disposition: 01-Home or Self Care       Discharge Instructions     Call MD for:   Complete by: As directed    FEVER > 101.5 F (Temperatures <101.58F can occasionally happen and are not significant)   Call MD for:  extreme fatigue   Complete by: As directed    Call MD for:  persistant dizziness or light-headedness   Complete by: As directed    Call MD for:  persistant nausea and vomiting   Complete by: As directed    Call MD for:  redness, tenderness, or signs of infection (pain, swelling, redness, odor or green/yellow discharge around incision site)   Complete by: As directed    Call MD for:  severe uncontrolled pain   Complete by: As directed    Diet - low sodium heart healthy   Complete by: As directed    Follow a light diet the first few days at home.    If you feel full, bloated, or constipated, stay on a liquid diet until you feel better and not constipated. Gradually get back to a solid diet.  Avoid fast food or heavy meals the first week as you are more likely to get nauseated. It is expected for your digestive tract to need a few months to get back to normal.   Discharge wound care:   Complete by: As directed    You have an open wound.  wound care instructions.    In general, it is encouraged that you remove your dressing and packing, shower with soap & water, and replace your dressing once a day.   Pack the wound with clean gauze moistened with normal (0.9%) saline or KY gel to keep the wound moist & uninfected.    .   Eventually your body will heal & pull the open wound closed over the next few months.  Raw open wounds will occasionally bleed or secrete yellow drainage until it heals closed.   Pressure on the dressing for 30 minutes will stop most wound bleeding Drain sites will drain a little until the drain is removed.   SEE  OSTOMY CARE INSTRUCTIONS   Driving Restrictions   Complete by: As directed    You may drive when you are no longer taking narcotic prescription pain medication, you can comfortably wear a seatbelt, and you can safely make sudden turns/stops to protect yourself without hesitating due to pain.   Increase activity slowly   Complete by: As directed    Lifting restrictions   Complete by: As directed    Start light daily activities --- self-care, walking, climbing stairs- beginning the day after surgery.   Gradually increase activities as tolerated.   Control your pain to be active.   Stop when you are tired.   Ideally, walk several times a day, eventually an hour a day.   Most people are back to most day-to-day activities in a few weeks.  It takes 4-8 weeks to get back to unrestricted, intense activity. If you  can walk 30 minutes without difficulty, it is safe to try more intense activity such as jogging, treadmill, bicycling, low-impact aerobics, swimming, etc. Save the most intensive and strenuous activity for last (Usually 4-8 weeks after surgery) such as sit-ups, heavy lifting, contact sports, etc.   Refrain from any intense heavy lifting or straining until you are off narcotics for pain control.  You will have off days, but things should improve week-by-week. DO NOT PUSH THROUGH PAIN.   Let pain be your guide: If it hurts to do something, don't do it.  Pain is your body warning you to avoid that activity for another week until the pain goes down.   May shower / Bathe   Complete by: As directed    May walk up steps   Complete by: As directed    Sexual Activity Restrictions   Complete by: As directed    You may have sexual intercourse when it is comfortable. If it hurts to do something, stop.       Allergies as of 07/02/2021       Reactions   Sulfa Antibiotics Swelling   Facial swelling        Medication List     TAKE these medications    ALPRAZolam 0.25 MG tablet Commonly  known as: XANAX Take 0.25 mg by mouth daily as needed for anxiety.   amLODipine-valsartan 5-320 MG tablet Commonly known as: EXFORGE Take 1 tablet by mouth daily.   atenolol 100 MG tablet Commonly known as: TENORMIN Take 100 mg by mouth daily.   cholecalciferol 1000 units tablet Commonly known as: VITAMIN D Take 1,000 Units by mouth daily.   ibuprofen 200 MG tablet Commonly known as: ADVIL Take 200 mg by mouth every 6 (six) hours as needed for mild pain or headache.   meloxicam 15 MG tablet Commonly known as: MOBIC Take 15 mg by mouth daily.   pantoprazole 20 MG tablet Commonly known as: PROTONIX Take 20 mg by mouth daily before breakfast.   Systane Balance 0.6 % Soln Generic drug: Propylene Glycol Place 1 drop into both eyes daily as needed (dry eyes).   traMADol 50 MG tablet Commonly known as: ULTRAM Take 1-2 tablets (50-100 mg total) by mouth every 6 (six) hours as needed for moderate pain or severe pain (50 mg for moderate, 100 mg for severe).   Viactiv Calcium Plus D 650-12.5-40 MG-MCG-MCG Chew Generic drug: Calcium-Vitamin D-Vitamin K Chew 1 tablet by mouth 2 (two) times daily.               Discharge Care Instructions  (From admission, onward)           Start     Ordered   07/02/21 0000  Discharge wound care:       Comments: You have an open wound.  wound care instructions.    In general, it is encouraged that you remove your dressing and packing, shower with soap & water, and replace your dressing once a day.   Pack the wound with clean gauze moistened with normal (0.9%) saline or KY gel to keep the wound moist & uninfected.    .   Eventually your body will heal & pull the open wound closed over the next few months.  Raw open wounds will occasionally bleed or secrete yellow drainage until it heals closed.   Pressure on the dressing for 30 minutes will stop most wound bleeding Drain sites will drain a little until the drain is removed.  SEE  OSTOMY CARE INSTRUCTIONS   07/02/21 1039            Significant Diagnostic Studies:  Results for orders placed or performed during the hospital encounter of 06/27/21 (from the past 72 hour(s))  CBC     Status: Abnormal   Collection Time: 06/30/21  4:28 AM  Result Value Ref Range   WBC 4.8 4.0 - 10.5 K/uL   RBC 3.18 (L) 3.87 - 5.11 MIL/uL   Hemoglobin 9.9 (L) 12.0 - 15.0 g/dL   HCT 30.7 (L) 36.0 - 46.0 %   MCV 96.5 80.0 - 100.0 fL   MCH 31.1 26.0 - 34.0 pg   MCHC 32.2 30.0 - 36.0 g/dL   RDW 12.7 11.5 - 15.5 %   Platelets 168 150 - 400 K/uL   nRBC 0.0 0.0 - 0.2 %    Comment: Performed at [facility], [place] [address], [place], [place] [address]  Basic metabolic panel     Status: Abnormal   Collection Time: 06/30/21  4:28 AM  Result Value Ref Range   Sodium 136 135 - 145 mmol/L   Potassium 3.6 3.5 - 5.1 mmol/L   Chloride 108 98 - 111 mmol/L   CO2 24 22 - 32 mmol/L   Glucose, Bld 94 70 - 99 mg/dL    Comment: Glucose reference range applies only to samples taken after fasting for at least 8 hours.   BUN 10 8 - 23 mg/dL   Creatinine, Ser 0.80 0.44 - 1.00 mg/dL   Calcium 8.0 (L) 8.9 - 10.3 mg/dL   GFR, Estimated >60 >60 mL/min    Comment: (NOTE) Calculated using the CKD-EPI Creatinine Equation (2021)    Anion gap 4 (L) 5 - 15    Comment: Performed at [facility], [place] [address] [place], [place] [address]  Resp Panel by RT-PCR (Flu A&B, Covid) Nasopharyngeal Swab     Status: None   Collection Time: 07/01/21  8:48 AM   Specimen: Nasopharyngeal Swab; Nasopharyngeal(NP) swabs in vial transport medium  Result Value Ref Range   SARS Coronavirus 2 by RT PCR NEGATIVE NEGATIVE    Comment: (NOTE) SARS-CoV-2 target nucleic acids are NOT DETECTED.  The SARS-CoV-2 RNA is generally detectable in upper respiratory specimens during the acute phase of infection. The lowest concentration of SARS-CoV-2 viral copies this assay can detect is 138  copies/mL. A negative result does not preclude SARS-Cov-2 infection and should not be used as the sole basis for treatment or other patient management decisions. A negative result may occur with  improper specimen collection/handling, submission of specimen other than nasopharyngeal swab, presence of viral mutation(s) within the areas targeted by this assay, and inadequate number of viral copies(<138 copies/mL). A negative result must be combined with clinical observations, patient history, and epidemiological information. The expected result is Negative.  Fact Sheet for Patients:  EntrepreneurPulse.com.au  Fact Sheet for Healthcare Providers:  IncredibleEmployment.be  This test is no t yet approved or cleared by the [place] FDA and  has been authorized for detection and/or diagnosis of SARS-CoV-2 by FDA under an Emergency Use Authorization (EUA). This EUA will remain  in effect (meaning this test can be used) for the duration of the COVID-19 declaration under Section 564(b)(1) of the Act, 21 U.S.C.section 360bbb-3(b)(1), unless the authorization is terminated  or revoked sooner.       Influenza A by PCR NEGATIVE NEGATIVE   Influenza B by PCR NEGATIVE NEGATIVE    Comment: (  NOTE) The Xpert Xpress SARS-CoV-2/FLU/RSV plus assay is intended as an aid in the diagnosis of influenza from Nasopharyngeal swab specimens and should not be used as a sole basis for treatment. Nasal washings and aspirates are unacceptable for Xpert Xpress SARS-CoV-2/FLU/RSV testing.  Fact Sheet for Patients: EntrepreneurPulse.com.au  Fact Sheet for Healthcare Providers: IncredibleEmployment.be  This test is not yet approved or cleared by the [place] FDA and has been authorized for detection and/or diagnosis of SARS-CoV-2 by FDA under an Emergency Use Authorization (EUA). This EUA will remain in effect (meaning this test  can be used) for the duration of the COVID-19 declaration under Section 564(b)(1) of the Act, 21 U.S.C. section 360bbb-3(b)(1), unless the authorization is terminated or revoked.  Performed at [facility], [place] [address] [place],  [address]     CT ABDOMEN PELVIS W CONTRAST  Result Date: 06/27/2021 CLINICAL DATA:  Acute, non localized abdominal pain since last night. Some relief with Pepto-Bismol. EXAM: CT ABDOMEN AND PELVIS WITH CONTRAST TECHNIQUE: Multidetector CT imaging of the abdomen and pelvis was performed using the standard protocol following bolus administration of intravenous contrast. RADIATION DOSE REDUCTION: This exam was performed according to the departmental dose-optimization program which includes automated exposure control, adjustment of the mA and/or kV according to patient size and/or use of iterative reconstruction technique. CONTRAST:  144mL OMNIPAQUE IOHEXOL 300 MG/ML  SOLN COMPARISON:  Complete abdomen ultrasound dated 07/20/2014 FINDINGS: Lower chest: Enlarged heart. Mild right and minimal left dependent atelectasis. Hepatobiliary: Small liver cyst. Pancreas: Unremarkable. No pancreatic ductal dilatation or surrounding inflammatory changes. Spleen: Normal in size without focal abnormality. Adrenals/Urinary Tract: Adrenal glands are unremarkable. Kidneys are normal, without renal calculi, focal lesion, or hydronephrosis. Bladder is unremarkable. Stomach/Bowel: Multiple normal caliber small bowel loops with diffuse wall thickening and adjacent soft tissue stranding and edema. No pneumatosis is seen. Multiple colonic diverticula without evidence of diverticulitis. Multiple high density particles in the stool compatible with the history of taking Pepto-Bismol. Unremarkable stomach.  The appendix is not visualized. Vascular/Lymphatic: Atheromatous arterial calcifications without aneurysm. No enlarged lymph nodes. Reproductive: 2.1 cm simple appearing left  ovarian cyst. Unremarkable uterus and right adnexa. Other: Moderate amount of free peritoneal air. Small amount of free peritoneal fluid. Musculoskeletal: Lumbar and lower thoracic spine degenerative changes. IMPRESSION: 1. Moderate amount of free peritoneal air, compatible with bowel perforation. 2. Multiple small bowel loops with diffuse wall thickening and adjacent soft tissue edema. This is most likely due to infectious enteritis. This does not have the typical distribution of ischemic changes. 3. Extensive colonic diverticulosis with no localized findings suspicious for diverticulitis. 4. Cardiomegaly. 5. 2.1 cm simple appearing left ovarian cyst. No follow-up imaging recommended. Note: This recommendation does not apply to those with increased risk (genetic, family history, elevated tumor markers or other high-risk factors) of ovarian cancer. Reference: JACR 2020 Feb; 17(2):248-254 Critical Value/emergent results were called by telephone at the time of interpretation on 06/27/2021 at 2:41 pm to provider DAN FLOYD , who verbally acknowledged these results. Electronically Signed   By: Claudie Revering M.D.   On: 06/27/2021 14:48    Past Medical History:  Diagnosis Date   Acid reflux    Anxiety    Diverticulosis    Hiatal hernia    Hypertension    off medication since 9/13   Hypertension    Iron deficiency anemia 03/19/2014   Leukopenia 03/19/2014   Melanoma ([place]) 2018   On back.  Dr. Ubaldo Glassing follows.   Memory change  08/16/2015   Spinal stenosis     Past Surgical History:  Procedure Laterality Date   COLONOSCOPY WITH ESOPHAGOGASTRODUODENOSCOPY (EGD)     LAPAROTOMY N/A 06/27/2021   Procedure: EXPLORATORY LAPAROTOMY, SIGMOID COLECTOMY WITH COLOSTOMY;  Surgeon: Jovita Kussmaul, MD;  Location: [facility];  Service: General;  Laterality: N/A;   RENAL ANGIOGRAPHY Right 02/05/2019   Procedure: RENAL ANGIOGRAPHY;  Surgeon: Marty Heck, MD;  Location: [facility];  Service: Cardiovascular;   Laterality: Right;    Social History   Socioeconomic History   Marital status: Married    Spouse name: Not on file   Number of children: 3   Years of education: Masters   Highest education level: Not on file  Occupational History   Not on file  Tobacco Use   Smoking status: Never   Smokeless tobacco: Never  Vaping Use   Vaping Use: Never used  Substance and Sexual Activity   Alcohol use: Yes    Comment: 2 glasses of wine/month   Drug use: No   Sexual activity: Not Currently    Partners: Male    Birth control/protection: Post-menopausal  Other Topics Concern   Not on file  Social History Narrative   Married lives with hsuband at home.  Retired.  Has Masters degree.  Has 3 children.    Right-handed   Caffeine:   Social Determinants of Radio broadcast assistant Strain: Not on file  Food Insecurity: Not on file  Transportation Needs: Not on file  Physical Activity: Not on file  Stress: Not on file  Social Connections: Not on file  Intimate Partner Violence: Not on file    Family History  Problem Relation Age of Onset   Stroke Mother    Dementia Mother    Lymphoma Brother             Current Facility-Administered Medications  Medication Dose Route Frequency Provider Last Rate Last Admin   acetaminophen (TYLENOL) tablet 1,000 mg  1,000 mg Oral Q6H PRN Jillyn Ledger, PA-C   1,000 mg at 07/01/21 2223   ALPRAZolam (XANAX) tablet 0.25 mg  0.25 mg Oral Daily PRN Autumn Messing III, MD       alum & mag hydroxide-simeth (MAALOX/MYLANTA) 200-200-20 MG/5ML suspension 30 mL  30 mL Oral Q6H PRN Ileana Roup, MD   30 mL at 07/02/21 0544   amLODipine (NORVASC) tablet 5 mg  5 mg Oral Daily Autumn Messing III, MD   5 mg at 07/01/21 2202   And   irbesartan (AVAPRO) tablet 300 mg  300 mg Oral Daily Autumn Messing III, MD   300 mg at 07/01/21 0853   atenolol (TENORMIN) tablet 100 mg  100 mg Oral q1800 Autumn Messing III, MD   100 mg at 07/01/21 1705   diphenhydrAMINE (BENADRYL) 12.5  MG/5ML elixir 12.5 mg  12.5 mg Oral Q6H PRN Autumn Messing III, MD       Or   diphenhydrAMINE (BENADRYL) injection 12.5 mg  12.5 mg Intravenous Q6H PRN Autumn Messing III, MD       docusate sodium (COLACE) capsule 100 mg  100 mg Oral BID Norm Parcel, PA-C   100 mg at 07/01/21 2223   heparin injection 5,000 Units  5,000 Units Subcutaneous Q8H Autumn Messing III, MD   5,000 Units at 07/02/21 0544   hydrALAZINE (APRESOLINE) injection 10 mg  10 mg Intravenous Q2H PRN Jovita Kussmaul, MD       melatonin tablet 3  mg  3 mg Oral QHS PRN Autumn Messing III, MD   3 mg at 06/30/21 2221   methocarbamol (ROBAXIN) tablet 500 mg  500 mg Oral Q6H PRN Jillyn Ledger, PA-C   500 mg at 06/28/21 1612   morphine 2 MG/ML injection 2 mg  2 mg Intravenous Q2H PRN Jillyn Ledger, PA-C       ondansetron (ZOFRAN-ODT) disintegrating tablet 4 mg  4 mg Oral Q6H PRN Autumn Messing III, MD       Or   ondansetron [facility]) injection 4 mg  4 mg Intravenous Q6H PRN Autumn Messing III, MD       pantoprazole (PROTONIX) EC tablet 40 mg  40 mg Oral Daily Stechschulte, Nickola Major, MD   40 mg at 07/01/21 1215   piperacillin-tazobactam (ZOSYN) IVPB 3.375 g  3.375 g Intravenous Q8H Autumn Messing III, MD 12.5 mL/hr at 07/02/21 0547 3.375 g at 07/02/21 0547   traMADol (ULTRAM) tablet 50-100 mg  50-100 mg Oral Q6H PRN Norm Parcel, PA-C   100 mg at 06/30/21 2221     Allergies  Allergen Reactions   Sulfa Antibiotics Swelling    Facial swelling    Signed: Morton Peters, MD, FACS, MASCRS Esophageal, Gastrointestinal & Colorectal Surgery Robotic and Minimally Invasive Surgery  [facility], [place]  [place]. [address] [place], [place] [address] [phone] Fax [phone] Main  CONTACT INFORMATION:  Weekday (9AM-5PM): Call [facility] main office at [phone]  Weeknight (5PM-9AM) or Weekend/Holiday: Check [email or web address] (password " [email or web address]") for General Surgery [facility]  coverage  (Please, do not use SecureChat as it is not reliable communication to operating surgeons for immediate patient care)      07/02/2021, 10:41 AM

## 2021-07-05 ENCOUNTER — Other Ambulatory Visit: Payer: Self-pay

## 2021-07-05 ENCOUNTER — Ambulatory Visit (HOSPITAL_COMMUNITY)
Admission: RE | Admit: 2021-07-05 | Discharge: 2021-07-05 | Disposition: A | Discharge status: Home / Self Care | Payer: Medicare PPO | Source: Ambulatory Visit | Attending: Nurse Practitioner | Admitting: Nurse Practitioner

## 2021-07-05 DIAGNOSIS — Z433 Encounter for attention to colostomy: Secondary | ICD-10-CM | POA: Diagnosis not present

## 2021-07-05 DIAGNOSIS — K5900 Constipation, unspecified: Secondary | ICD-10-CM | POA: Insufficient documentation

## 2021-07-05 DIAGNOSIS — T8189XA Other complications of procedures, not elsewhere classified, initial encounter: Secondary | ICD-10-CM | POA: Insufficient documentation

## 2021-07-05 DIAGNOSIS — Y833 Surgical operation with formation of external stoma as the cause of abnormal reaction of the patient, or of later complication, without mention of misadventure at the time of the procedure: Secondary | ICD-10-CM | POA: Diagnosis not present

## 2021-07-05 DIAGNOSIS — K9400 Colostomy complication, unspecified: Secondary | ICD-10-CM | POA: Diagnosis not present

## 2021-07-05 NOTE — Progress Notes (Signed)
[facility]   Reason for visit:  LLQ colostomy HPI:  Perforation of sigmoid colon with colectomy and Hartmann's procedure/ colostomy ROS  Review of Systems  Gastrointestinal:  Positive for constipation.       Discussed adding metamucil and colace to thin out thick pasty stool.  Enocuraged to drink at least 64 ounces water daily and plenty of water with fiber   Skin:  Positive for wound.       Midline abdominal wound  All other systems reviewed and are negative. Vital signs:  BP (!) 143/48 (BP Location: Right Arm)    Pulse (!) 58    Temp 97.7 F (36.5 C) (Oral)    Resp 18    LMP 05/30/2003    SpO2 99%  Exam:  Physical Exam Constitutional:      Appearance: She is normal weight.  Abdominal:     Palpations: Abdomen is soft.     Comments: Midline abdominal wound.  Twice daily NS moist gauze dressing changes.   Skin:    General: Skin is warm and dry.  Neurological:     Mental Status: She is alert.  Psychiatric:        Mood and Affect: Mood normal.        Thought Content: Thought content normal.    Stoma type/location:  LLQ colostomy well budded Stomal assessment/size:  1 1/2" pink and moist Peristomal assessment:  intact Midline abdominal wound, surgical. Dressing changes twice daily. Treatment options for stomal/peristomal skin: 2 piece pouch with barrier ring Output: soft brown stool Ostomy pouching: 2 pc.2 3/4" pouch with barrier ring.    Education provided: Is feeling stronger and wishes to increase activity.  We will add a belt today for increased security.    Impression/dx  Colostomy  Discussion  Pouch change performed.   Patient and daughter feel more confident in care.  Will continue working with [facility].  Plan  See back in 2 weeks. Will enroll with [facility]    Visit time: 50 minutes.   Domenic Moras FNP-BC

## 2021-07-07 NOTE — Discharge Instructions (Signed)
Will enroll with [facility]. Once [facility] is out, you will order supplies from them.

## 2021-07-12 ENCOUNTER — Other Ambulatory Visit (HOSPITAL_COMMUNITY): Payer: Self-pay | Admitting: Nurse Practitioner

## 2021-07-12 DIAGNOSIS — K9400 Colostomy complication, unspecified: Secondary | ICD-10-CM

## 2021-08-23 ENCOUNTER — Other Ambulatory Visit: Payer: Self-pay

## 2021-08-23 ENCOUNTER — Emergency Department (HOSPITAL_BASED_OUTPATIENT_CLINIC_OR_DEPARTMENT_OTHER)
Admission: EM | Admit: 2021-08-23 | Discharge: 2021-08-23 | Disposition: A | Discharge status: Home / Self Care | Payer: Medicare PPO | Attending: Emergency Medicine | Admitting: Emergency Medicine

## 2021-08-23 ENCOUNTER — Ambulatory Visit (HOSPITAL_COMMUNITY)
Admission: RE | Admit: 2021-08-23 | Discharge: 2021-08-23 | Disposition: A | Discharge status: Home / Self Care | Payer: Medicare PPO | Source: Ambulatory Visit

## 2021-08-23 ENCOUNTER — Encounter (HOSPITAL_BASED_OUTPATIENT_CLINIC_OR_DEPARTMENT_OTHER): Payer: Self-pay

## 2021-08-23 DIAGNOSIS — Z433 Encounter for attention to colostomy: Secondary | ICD-10-CM | POA: Diagnosis not present

## 2021-08-23 DIAGNOSIS — I10 Essential (primary) hypertension: Secondary | ICD-10-CM

## 2021-08-23 DIAGNOSIS — Z79899 Other long term (current) drug therapy: Secondary | ICD-10-CM | POA: Insufficient documentation

## 2021-08-23 NOTE — Progress Notes (Signed)
[facility]  ? ?Reason for visit:  ?LLQ colostomy ?HPI:  ?Hartmann's procedure/colostomy ?ROS  ?Review of Systems  ?Cardiovascular:   ?     Hypertension  ?Gastrointestinal:  Positive for constipation.  ?     LLQ colostomy ?Healing midline wound ?Ongoing thick pasty stools  ?Musculoskeletal:  Positive for back pain.  ?Skin: Negative.   ?All other systems reviewed and are negative. ?Vital signs:  ?BP (!) 195/66 (BP Location: Left Arm)   Pulse (!) 56   Temp 97.8 ?F (36.6 ?C) (Oral)   Resp 20   LMP 05/30/2003   SpO2 99%  ?Exam:  ?Physical Exam ?Vitals reviewed.  ?Constitutional:   ?   Appearance: She is normal weight.  ?Cardiovascular:  ?   Rate and Rhythm: Normal rate and regular rhythm.  ?   Pulses: Normal pulses.  ?   Heart sounds: Normal heart sounds.  ?Pulmonary:  ?   Effort: Pulmonary effort is normal.  ?   Breath sounds: Normal breath sounds.  ?Abdominal:  ?   Palpations: Abdomen is soft.  ?   Comments: Midline healing surgical wound  ?Skin: ?   General: Skin is warm and dry.  ?Neurological:  ?   General: No focal deficit present.  ?   Mental Status: She is alert and oriented to person, place, and time.  ?Psychiatric:     ?   Mood and Affect: Mood normal.     ?   Behavior: Behavior normal.  ?  ?Stoma type/location:  LLQ colostomy ?Stomal assessment/size:  1 1/2" pink and moist ?Peristomal assessment:  intact ?Treatment options for stomal/peristomal skin: barrier ring and 2 piece 2 1/4" pouch with ostomy belt ?Output: thick stool  Had improved with taking miralax, has missed some doses and stool is thick.  Encouraged to continue twice daily MIralax, drink plenty of water and get physical activity.  She walks daily.  ?Ostomy pouching: 2 piece 2 1/4" pouch  with barrier ring and belt ?Education provided:  BP is elevated, history of hypertension, on meloxicam and has noted increased edema in her hands.  No headache, dizziness or arrhythmia ? ?  ?Impression/dx  ?Hypertension ?ostomy ?Discussion   ?See back as needed.  ?Plan  ?Will assist in ordering supplies as needed.  ? ? ? ?Visit time: 50 minutes.  ? ?Domenic Moras FNP-BC ? ?  ?

## 2021-08-23 NOTE — ED Triage Notes (Signed)
Pt presents POV for concerns of high blood pressure. Pt reports she had a BP of 204 "over something" and then 170/80 at the ostomy clinic. [facility] RN got a reading of 190/80, pt has been checking her BP at home, 3/26 156/77 and 3/27 147/80. ? ?Pt reports they stopped her Meloxicam 06/28/2021 for a perforated colon. Pt took a Meloxicam today d/t swelling and pain in her fingers.  ? ?Pt denies headache, dizziness, SOB, vision change or CP  ?

## 2021-08-23 NOTE — Discharge Instructions (Signed)
Do not take any more meloxicam and follow-up with your doctor this week for repeat blood pressure check ?

## 2021-08-23 NOTE — ED Provider Notes (Signed)
?[facility] EMERGENCY DEPT ?Provider Note ? ? ?CSN: [id number] ?Arrival date & time: 08/23/21  1157 ? ?  ? ?History ? ?Chief Complaint  ?Patient presents with  ? Hypertension  ? ? ?Makinze DONESHIA HILL is a 71 y.o. female. ? ?71 year old female who presents due to increased blood pressure today.  Patient takes to get medications for hypertension.  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  Denies any swelling in her ankles.  She has not been short of breath.  No headache.  Patient recently had a colostomy placed and was there for a ostomy check at that time.  Was found to have high blood pressure and then sent here. ? ? ?  ? ?Home Medications ?Prior to Admission medications   ?Medication Sig Start Date End Date Taking? Authorizing Provider  ?ALPRAZolam (XANAX) 0.25 MG tablet Take 0.25 mg by mouth daily as needed for anxiety.  11/04/12   [provider]  ?amLODipine-valsartan (EXFORGE) 5-320 MG tablet Take 1 tablet by mouth daily.    [provider]  ?atenolol (TENORMIN) 100 MG tablet Take 100 mg by mouth daily.  11/28/17   [provider]  ?Calcium-Vitamin D-Vitamin K (VIACTIV CALCIUM PLUS D) 650-12.5-40 MG-MCG-MCG CHEW Chew 1 tablet by mouth 2 (two) times daily.    [provider]  ?cholecalciferol (VITAMIN D) 1000 units tablet Take 1,000 Units by mouth daily.    [provider]  ?ibuprofen (ADVIL) 200 MG tablet Take 200 mg by mouth every 6 (six) hours as needed for mild pain or headache.    [provider]  ?meloxicam (MOBIC) 15 MG tablet Take 15 mg by mouth daily.    [provider]  ?pantoprazole (PROTONIX) 20 MG tablet Take 20 mg by mouth daily before breakfast.     [provider]  ?Propylene Glycol (SYSTANE BALANCE) 0.6 % SOLN Place 1 drop into both eyes daily as needed (dry eyes).    [provider]  ?traMADol (ULTRAM) 50 MG tablet Take 1-2 tablets (50-100 mg total) by mouth every 6 (six)  hours as needed for moderate pain or severe pain (50 mg for moderate, 100 mg for severe). 07/02/21   Michael Boston, MD  ?   ? ?Allergies    ?Sulfa antibiotics   ? ?Review of Systems   ?Review of Systems  ?All other systems reviewed and are negative. ? ?Physical Exam ?Updated Vital Signs ?BP (!) 172/82 (BP Location: Right Arm)   Pulse 68   Temp 98.5 ?F (36.9 ?C)   Resp 16   LMP 05/30/2003   SpO2 100%  ?Physical Exam ?Vitals and nursing note reviewed.  ?Constitutional:   ?   General: She is not in acute distress. ?   Appearance: Normal appearance. She is well-developed. She is not toxic-appearing.  ?HENT:  ?   Head: Normocephalic and atraumatic.  ?Eyes:  ?   General: Lids are normal.  ?   Conjunctiva/sclera: Conjunctivae normal.  ?   Pupils: Pupils are equal, round, and reactive to light.  ?Neck:  ?   Thyroid: No thyroid mass.  ?   Trachea: No tracheal deviation.  ?Cardiovascular:  ?   Rate and Rhythm: Normal rate and regular rhythm.  ?   Heart sounds: Normal heart sounds. No murmur heard. ?  No gallop.  ?Pulmonary:  ?   Effort: Pulmonary effort is normal. No respiratory distress.  ?   Breath sounds: Normal breath sounds. No stridor. No decreased  breath sounds, wheezing, rhonchi or rales.  ?Abdominal:  ?   General: There is no distension.  ?   Palpations: Abdomen is soft.  ?   Tenderness: There is no abdominal tenderness. There is no rebound.  ?Musculoskeletal:     ?   General: No tenderness. Normal range of motion.  ?   Cervical back: Normal range of motion and neck supple.  ?Skin: ?   General: Skin is warm and dry.  ?   Findings: No abrasion or rash.  ?Neurological:  ?   Mental Status: She is alert and oriented to person, place, and time. Mental status is at baseline.  ?   GCS: GCS eye subscore is 4. GCS verbal subscore is 5. GCS motor subscore is 6.  ?   Cranial Nerves: No cranial nerve deficit.  ?   Sensory: No sensory deficit.  ?   Motor: Motor function is intact.  ?Psychiatric:     ?   Attention and  Perception: Attention normal.     ?   Speech: Speech normal.     ?   Behavior: Behavior normal.  ? ? ?ED Results / Procedures / Treatments   ?Labs ?(all labs ordered are listed, but only abnormal results are displayed) ?Labs Reviewed - No data to display ? ?EKG ?None ? ?Radiology ?No results found. ? ?Procedures ?Procedures  ? ? ?Medications Ordered in ED ?Medications - No data to display ? ?ED Course/ Medical Decision Making/ A&P ?  ?                        ?Medical Decision Making ? ?Patient's normal blood pressure had a systolic 297.  Repeat blood pressure here shows that she had a systolic in the 989Q 119E.  She remains asymptomatic.  Suspect that the use of the meloxicam is what caused her blood pressure to spike.  She is otherwise asymptomatic.  Plan will be for her to stop take meloxicam and follow-up with her doctor for a repeat blood pressure check ? ? ? ? ? ? ? ?Final Clinical Impression(s) / ED Diagnoses ?Final diagnoses:  ?None  ? ? ?Rx / DC Orders ?ED Discharge Orders   ? ? None  ? ?  ? ? ?  ?Lacretia Leigh, MD ?08/23/21 1249 ? ?

## 2021-08-24 DIAGNOSIS — Z8719 Personal history of other diseases of the digestive system: Secondary | ICD-10-CM | POA: Diagnosis not present

## 2021-08-24 DIAGNOSIS — I10 Essential (primary) hypertension: Secondary | ICD-10-CM | POA: Diagnosis not present

## 2021-08-24 DIAGNOSIS — Z933 Colostomy status: Secondary | ICD-10-CM | POA: Diagnosis not present

## 2021-08-24 DIAGNOSIS — M19071 Primary osteoarthritis, right ankle and foot: Secondary | ICD-10-CM | POA: Diagnosis not present

## 2021-08-26 ENCOUNTER — Ambulatory Visit (HOSPITAL_COMMUNITY)
Admission: RE | Admit: 2021-08-26 | Discharge: 2021-08-26 | Disposition: A | Discharge status: Home / Self Care | Payer: Medicare PPO | Source: Ambulatory Visit | Attending: Nurse Practitioner | Admitting: Nurse Practitioner

## 2021-08-26 DIAGNOSIS — K5901 Slow transit constipation: Secondary | ICD-10-CM | POA: Diagnosis not present

## 2021-08-26 DIAGNOSIS — I10 Essential (primary) hypertension: Secondary | ICD-10-CM | POA: Insufficient documentation

## 2021-08-26 DIAGNOSIS — K5900 Constipation, unspecified: Secondary | ICD-10-CM | POA: Insufficient documentation

## 2021-08-26 DIAGNOSIS — K9400 Colostomy complication, unspecified: Secondary | ICD-10-CM

## 2021-08-26 DIAGNOSIS — R609 Edema, unspecified: Secondary | ICD-10-CM | POA: Insufficient documentation

## 2021-08-26 DIAGNOSIS — Z433 Encounter for attention to colostomy: Secondary | ICD-10-CM | POA: Insufficient documentation

## 2021-08-26 DIAGNOSIS — L539 Erythematous condition, unspecified: Secondary | ICD-10-CM | POA: Insufficient documentation

## 2021-08-26 NOTE — Progress Notes (Signed)
[facility]  ? ?Reason for visit:  ?LLQ colostomy ?HPI:  ?Perforated diverticulum ?ROS  ?Review of Systems  ?Cardiovascular:   ?     Edema to hands after one dose meloxicam and elevated BP   ?Gastrointestinal:  Positive for constipation.  ?     LLQ colostomy  ?Skin:  Positive for color change.  ?     Peristomal skin is erythematous  ?Psychiatric/Behavioral: Negative.    ?All other systems reviewed and are negative. ?Vital signs:  ?BP (!) 191/90   Pulse (!) 58   Temp 97.9 ?F (36.6 ?C) (Oral)   Resp 17   LMP 05/30/2003   SpO2 100%  ?Exam:  ?Physical Exam ?Constitutional:   ?   Appearance: Normal appearance.  ?Abdominal:  ?   Palpations: Abdomen is soft.  ?Skin: ?   Findings: Erythema present.  ?   Comments: Periwound intact but erythematous  ?Neurological:  ?   Mental Status: She is alert and oriented to person, place, and time.  ?Psychiatric:     ?   Mood and Affect: Mood normal.     ?   Behavior: Behavior normal.  ?  ?Stoma type/location:  LLQ colostomy ?Stomal assessment/size:  1 1/2" pink and moist  pouch is leaking at times due to thick stool and pancaking near stoma.  Stool is not falling down into pouch. Has tried lubricating deodorant.  We will stop barrier ring and use convex pouch cut to fit.  ?Peristomal assessment:  erythema, intact ?Treatment options for stomal/peristomal skin: 1 piece convex pouch with belt ?Output: thick brown stool ?Ostomy pouching: 1pc. Convex pouch with belt ?Education provided:  stop barrier ring. Begin 1 piece convex ? ?  ?Impression/dx  ?Remains constipated ? ?Discussion  ?After last visit, went to ED with hypertension.  Following up with cardiology.  Edema is improving, no headaches or dizziness ?Plan  ?Switch to 1 piece convex  no barrier ring  Dust with antifungal powder and skin prep  add ostomy belt  ?Set up with [facility] ? ? ? ?Visit time: 55 minutes.  ? ?Domenic Moras FNP-BC ? ?  ?

## 2021-08-29 NOTE — Discharge Instructions (Signed)
Switch to 1 piece convex  ?No barrier ring ?

## 2021-09-05 ENCOUNTER — Other Ambulatory Visit: Payer: Self-pay | Admitting: Surgery

## 2021-09-05 DIAGNOSIS — K5720 Diverticulitis of large intestine with perforation and abscess without bleeding: Secondary | ICD-10-CM

## 2021-09-12 DIAGNOSIS — Z1231 Encounter for screening mammogram for malignant neoplasm of breast: Secondary | ICD-10-CM | POA: Diagnosis not present

## 2021-09-14 DIAGNOSIS — L84 Corns and callosities: Secondary | ICD-10-CM | POA: Diagnosis not present

## 2021-09-14 DIAGNOSIS — F419 Anxiety disorder, unspecified: Secondary | ICD-10-CM | POA: Diagnosis not present

## 2021-09-14 DIAGNOSIS — I10 Essential (primary) hypertension: Secondary | ICD-10-CM | POA: Diagnosis not present

## 2021-09-15 ENCOUNTER — Other Ambulatory Visit: Payer: Self-pay | Admitting: Surgery

## 2021-09-15 ENCOUNTER — Ambulatory Visit
Admission: RE | Admit: 2021-09-15 | Discharge: 2021-09-15 | Disposition: A | Discharge status: Home / Self Care | Payer: Medicare PPO | Source: Ambulatory Visit | Attending: Surgery | Admitting: Surgery

## 2021-09-15 DIAGNOSIS — K5720 Diverticulitis of large intestine with perforation and abscess without bleeding: Secondary | ICD-10-CM

## 2021-09-15 DIAGNOSIS — K5790 Diverticulosis of intestine, part unspecified, without perforation or abscess without bleeding: Secondary | ICD-10-CM | POA: Diagnosis not present

## 2021-09-19 DIAGNOSIS — K5720 Diverticulitis of large intestine with perforation and abscess without bleeding: Secondary | ICD-10-CM | POA: Diagnosis not present

## 2021-09-19 DIAGNOSIS — Z933 Colostomy status: Secondary | ICD-10-CM | POA: Diagnosis not present

## 2021-10-04 DIAGNOSIS — K921 Melena: Secondary | ICD-10-CM | POA: Diagnosis not present

## 2021-10-05 DIAGNOSIS — D1801 Hemangioma of skin and subcutaneous tissue: Secondary | ICD-10-CM | POA: Diagnosis not present

## 2021-10-05 DIAGNOSIS — L814 Other melanin hyperpigmentation: Secondary | ICD-10-CM | POA: Diagnosis not present

## 2021-10-05 DIAGNOSIS — L570 Actinic keratosis: Secondary | ICD-10-CM | POA: Diagnosis not present

## 2021-10-05 DIAGNOSIS — L821 Other seborrheic keratosis: Secondary | ICD-10-CM | POA: Diagnosis not present

## 2021-10-05 DIAGNOSIS — Z85828 Personal history of other malignant neoplasm of skin: Secondary | ICD-10-CM | POA: Diagnosis not present

## 2021-10-05 DIAGNOSIS — D2239 Melanocytic nevi of other parts of face: Secondary | ICD-10-CM | POA: Diagnosis not present

## 2021-10-05 DIAGNOSIS — Z85820 Personal history of malignant melanoma of skin: Secondary | ICD-10-CM | POA: Diagnosis not present

## 2021-10-05 DIAGNOSIS — D225 Melanocytic nevi of trunk: Secondary | ICD-10-CM | POA: Diagnosis not present

## 2021-10-05 DIAGNOSIS — D692 Other nonthrombocytopenic purpura: Secondary | ICD-10-CM | POA: Diagnosis not present

## 2021-10-13 DIAGNOSIS — K5730 Diverticulosis of large intestine without perforation or abscess without bleeding: Secondary | ICD-10-CM | POA: Diagnosis not present

## 2021-10-13 DIAGNOSIS — R933 Abnormal findings on diagnostic imaging of other parts of digestive tract: Secondary | ICD-10-CM | POA: Diagnosis not present

## 2021-10-26 DIAGNOSIS — R0602 Shortness of breath: Secondary | ICD-10-CM | POA: Diagnosis not present

## 2021-10-26 DIAGNOSIS — I491 Atrial premature depolarization: Secondary | ICD-10-CM | POA: Diagnosis not present

## 2021-11-07 DIAGNOSIS — K5720 Diverticulitis of large intestine with perforation and abscess without bleeding: Secondary | ICD-10-CM | POA: Diagnosis not present

## 2021-11-09 DIAGNOSIS — R413 Other amnesia: Secondary | ICD-10-CM | POA: Diagnosis not present

## 2021-11-09 DIAGNOSIS — F419 Anxiety disorder, unspecified: Secondary | ICD-10-CM | POA: Diagnosis not present

## 2021-11-09 DIAGNOSIS — I491 Atrial premature depolarization: Secondary | ICD-10-CM | POA: Diagnosis not present

## 2021-11-22 ENCOUNTER — Ambulatory Visit: Payer: Medicare PPO | Admitting: Radiology

## 2021-11-22 DIAGNOSIS — N762 Acute vulvitis: Secondary | ICD-10-CM

## 2021-11-22 MED ORDER — NYSTATIN-TRIAMCINOLONE 100000-0.1 UNIT/GM-% EX OINT: 1.0000 | TOPICAL_OINTMENT | Freq: Two times a day (BID) | CUTANEOUS | 1 refills | Status: DC

## 2021-11-30 ENCOUNTER — Other Ambulatory Visit (HOSPITAL_COMMUNITY): Payer: Self-pay

## 2021-11-30 NOTE — Progress Notes (Signed)
Sent message, via epic in basket, requesting order in epic from surgeon     11/30/21 0932  Preop Orders  Has preop orders? No  Name of staff/physician contacted for orders(Indicate phone or IB message) C. Dema Severin, MD.

## 2021-12-02 ENCOUNTER — Encounter (HOSPITAL_COMMUNITY): Payer: Self-pay | Admitting: Nurse Practitioner

## 2021-12-02 ENCOUNTER — Ambulatory Visit: Payer: Self-pay | Admitting: Surgery

## 2021-12-02 DIAGNOSIS — Z01818 Encounter for other preprocedural examination: Secondary | ICD-10-CM

## 2021-12-06 NOTE — Patient Instructions (Signed)
DUE TO COVID-19 ONLY TWO VISITORS  (aged 71 and older)  ARE ALLOWED TO COME WITH YOU AND STAY IN THE WAITING ROOM ONLY DURING PRE OP AND PROCEDURE.   **NO VISITORS ARE ALLOWED IN THE SHORT STAY AREA OR RECOVERY ROOM!!**  IF YOU WILL BE ADMITTED INTO THE HOSPITAL YOU ARE ALLOWED ONLY FOUR SUPPORT PEOPLE DURING VISITATION HOURS ONLY (7 AM -8PM)   The support person(s) must pass our screening, gel in and out, and wear a mask at all times, including in the patient's room. Patients must also wear a mask when staff or their support person are in the room. Visitors GUEST BADGE MUST BE WORN VISIBLY  One adult visitor may remain with you overnight and MUST be in the room by 8 P.M.     Your procedure is scheduled on: 12/16/21   Report to [facility] Main Entrance    Report to admitting at 5:15 AM   Call this number if you have problems the morning of surgery [phone]   Follow clear liquid diet the day before   You may have the following liquids until 4:30 AM DAY OF SURGERY  Water Black Coffee (sugar ok, NO MILK/CREAM OR CREAMERS)  Tea (sugar ok, NO MILK/CREAM OR CREAMERS) regular and decaf                             Plain Jell-O (NO RED)                                           Fruit ices (not with fruit pulp, NO RED)                                     Popsicles (NO RED)                                                                  Juice: apple, WHITE grape, WHITE cranberry Sports drinks like Gatorade (NO RED) Clear broth(vegetable,chicken,beef)               Drink 2 Ensure drinks AT 10:00 PM the night before surgery.        The day of surgery:  Drink ONE (1) Pre-Surgery Clear Ensure at 4:30 AM the morning of surgery. Drink in one sitting. Do not sip.  This drink was given to you during your hospital  pre-op appointment visit. Nothing else to drink after completing the  Pre-Surgery Clear Ensure.          If you have questions, please contact your surgeon's  office.   FOLLOW BOWEL PREP AND ANY ADDITIONAL PRE OP INSTRUCTIONS YOU RECEIVED FROM YOUR SURGEON'S OFFICE!!!     Oral Hygiene is also important to reduce your risk of infection.                                    Remember - BRUSH YOUR TEETH THE MORNING OF SURGERY WITH YOUR REGULAR TOOTHPASTE  Take these medicines the morning of surgery with A SIP OF WATER: Xanax, Atenolol                               You may not have any metal on your body including hair pins, jewelry, and body piercing             Do not wear make-up, lotions, powders, perfumes, or deodorant  Do not wear nail polish including gel and S&S, artificial/acrylic nails, or any other type of covering on natural nails including finger and toenails. If you have artificial nails, gel coating, etc. that needs to be removed by a nail salon please have this removed prior to surgery or surgery may need to be canceled/ delayed if the surgeon/ anesthesia feels like they are unable to be safely monitored.   Do not shave  48 hours prior to surgery.    Do not bring valuables to the hospital. [place].   Bring small overnight bag day of surgery.   DO NOT [place]. PHARMACY WILL DISPENSE MEDICATIONS LISTED ON YOUR MEDICATION LIST TO YOU DURING YOUR ADMISSION [place]!    Special Instructions: Bring a copy of your healthcare power of attorney and living will documents         the day of surgery if you haven't scanned them before.              Please read over the following fact sheets you were given: IF YOU HAVE QUESTIONS ABOUT YOUR PRE-OP INSTRUCTIONS PLEASE CALL [facility] - Preparing for Surgery Before surgery, you can play an important role.  Because skin is not sterile, your skin needs to be as free of germs as possible.  You can reduce the number of germs on your skin by washing with CHG (chlorahexidine gluconate)  soap before surgery.  CHG is an antiseptic cleaner which kills germs and bonds with the skin to continue killing germs even after washing. Please DO NOT use if you have an allergy to CHG or antibacterial soaps.  If your skin becomes reddened/irritated stop using the CHG and inform your nurse when you arrive at Short Stay. Do not shave (including legs and underarms) for at least 48 hours prior to the first CHG shower.  You may shave your face/neck.  Please follow these instructions carefully:  1.  Shower with CHG Soap the night before surgery and the  morning of surgery.  2.  If you choose to wash your hair, wash your hair first as usual with your normal  shampoo.  3.  After you shampoo, rinse your hair and body thoroughly to remove the shampoo.                             4.  Use CHG as you would any other liquid soap.  You can apply chg directly to the skin and wash.  Gently with a scrungie or clean washcloth.  5.  Apply the CHG Soap to your body ONLY FROM THE NECK DOWN.   Do   not use on face/ open  Wound or open sores. Avoid contact with eyes, ears mouth and   genitals (private parts).                       Wash face,  Genitals (private parts) with your normal soap.             6.  Wash thoroughly, paying special attention to the area where your    surgery  will be performed.  7.  Thoroughly rinse your body with warm water from the neck down.  8.  DO NOT shower/wash with your normal soap after using and rinsing off the CHG Soap.                9.  Pat yourself dry with a clean towel.            10.  Wear clean pajamas.            11.  Place clean sheets on your bed the night of your first shower and do not  sleep with pets. Day of Surgery : Do not apply any lotions/deodorants the morning of surgery.  Please wear clean clothes to the hospital/surgery center.  FAILURE TO FOLLOW THESE INSTRUCTIONS MAY RESULT IN THE CANCELLATION OF YOUR SURGERY  PATIENT  SIGNATURE_________________________________  NURSE SIGNATURE__________________________________  ________________________________________________________________________   Adam Phenix  An incentive spirometer is a tool that can help keep your lungs clear and active. This tool measures how well you are filling your lungs with each breath. Taking long deep breaths may help reverse or decrease the chance of developing breathing (pulmonary) problems (especially infection) following: A long period of time when you are unable to move or be active. BEFORE THE PROCEDURE  If the spirometer includes an indicator to show your best effort, your nurse or respiratory therapist will set it to a desired goal. If possible, sit up straight or lean slightly forward. Try not to slouch. Hold the incentive spirometer in an upright position. INSTRUCTIONS FOR USE  Sit on the edge of your bed if possible, or sit up as far as you can in bed or on a chair. Hold the incentive spirometer in an upright position. Breathe out normally. Place the mouthpiece in your mouth and seal your lips tightly around it. Breathe in slowly and as deeply as possible, raising the piston or the ball toward the top of the column. Hold your breath for 3-5 seconds or for as long as possible. Allow the piston or ball to fall to the bottom of the column. Remove the mouthpiece from your mouth and breathe out normally. Rest for a few seconds and repeat Steps 1 through 7 at least 10 times every 1-2 hours when you are awake. Take your time and take a few normal breaths between deep breaths. The spirometer may include an indicator to show your best effort. Use the indicator as a goal to work toward during each repetition. After each set of 10 deep breaths, practice coughing to be sure your lungs are clear. If you have an incision (the cut made at the time of surgery), support your incision when coughing by placing a pillow or rolled up towels  firmly against it. Once you are able to get out of bed, walk around indoors and cough well. You may stop using the incentive spirometer when instructed by your caregiver.  RISKS AND COMPLICATIONS Take your time so you do not get dizzy or light-headed. If you are in pain, you  may need to take or ask for pain medication before doing incentive spirometry. It is harder to take a deep breath if you are having pain. AFTER USE Rest and breathe slowly and easily. It can be helpful to keep track of a log of your progress. Your caregiver can provide you with a simple table to help with this. If you are using the spirometer at home, follow these instructions: [place] IF:  You are having difficultly using the spirometer. You have trouble using the spirometer as often as instructed. Your pain medication is not giving enough relief while using the spirometer. You develop fever of 100.5 F (38.1 C) or higher. SEEK IMMEDIATE MEDICAL CARE IF:  You cough up bloody sputum that had not been present before. You develop fever of 102 F (38.9 C) or greater. You develop worsening pain at or near the incision site. MAKE SURE YOU:  Understand these instructions. Will watch your condition. Will get help right away if you are not doing well or get worse. Document Released: 09/25/2006 Document Revised: 08/07/2011 Document Reviewed: 11/26/2006 Exit[employer] Patient Information 2014 [employer], [place].   ________________________________________________________________________  WHAT IS A BLOOD TRANSFUSION? Blood Transfusion Information  A transfusion is the replacement of blood or some of its parts. Blood is made up of multiple cells which provide different functions. Red blood cells carry oxygen and are used for blood loss replacement. White blood cells fight against infection. Platelets control bleeding. Plasma helps clot blood. Other blood products are available for specialized needs, such as hemophilia or  other clotting disorders. BEFORE THE TRANSFUSION  Who gives blood for transfusions?  Healthy volunteers who are fully evaluated to make sure their blood is safe. This is blood bank blood. Transfusion therapy is the safest it has ever been in the practice of medicine. Before blood is taken from a donor, a complete history is taken to make sure that person has no history of diseases nor engages in risky social behavior (examples are intravenous drug use or sexual activity with multiple partners). The donor's travel history is screened to minimize risk of transmitting infections, such as malaria. The donated blood is tested for signs of infectious diseases, such as HIV and hepatitis. The blood is then tested to be sure it is compatible with you in order to minimize the chance of a transfusion reaction. If you or a relative donates blood, this is often done in anticipation of surgery and is not appropriate for emergency situations. It takes many days to process the donated blood. RISKS AND COMPLICATIONS Although transfusion therapy is very safe and saves many lives, the main dangers of transfusion include:  Getting an infectious disease. Developing a transfusion reaction. This is an allergic reaction to something in the blood you were given. Every precaution is taken to prevent this. The decision to have a blood transfusion has been considered carefully by your caregiver before blood is given. Blood is not given unless the benefits outweigh the risks. AFTER THE TRANSFUSION Right after receiving a blood transfusion, you will usually feel much better and more energetic. This is especially true if your red blood cells have gotten low (anemic). The transfusion raises the level of the red blood cells which carry oxygen, and this usually causes an energy increase. The nurse administering the transfusion will monitor you carefully for complications. HOME CARE INSTRUCTIONS  No special instructions are needed after  a transfusion. You may find your energy is better. Speak with your caregiver about any limitations on  activity for underlying diseases you may have. SEEK MEDICAL CARE IF:  Your condition is not improving after your transfusion. You develop redness or irritation at the intravenous (IV) site. SEEK IMMEDIATE MEDICAL CARE IF:  Any of the following symptoms occur over the next 12 hours: Shaking chills. You have a temperature by mouth above 102 F (38.9 C), not controlled by medicine. Chest, back, or muscle pain. People around you feel you are not acting correctly or are confused. Shortness of breath or difficulty breathing. Dizziness and fainting. You get a rash or develop hives. You have a decrease in urine output. Your urine turns a dark color or changes to pink, red, or brown. Any of the following symptoms occur over the next 10 days: You have a temperature by mouth above 102 F (38.9 C), not controlled by medicine. Shortness of breath. Weakness after normal activity. The white part of the eye turns yellow (jaundice). You have a decrease in the amount of urine or are urinating less often. Your urine turns a dark color or changes to pink, red, or brown. Document Released: 05/12/2000 Document Revised: 08/07/2011 Document Reviewed: 12/30/2007 [facility] Patient Information 2014 [place], [place].  _______________________________________________________________________

## 2021-12-06 NOTE — Progress Notes (Signed)
COVID Vaccine Completed: yes x3  Date of COVID positive in last 90 days:  PCP - Jonathon Jordan, MD Cardiologist -   Chest x-ray -  EKG - 08/23/21 Epic Stress Test -  ECHO -  Cardiac Cath -  Pacemaker/ICD device last checked: Spinal Cord Stimulator:  Bowel Prep - antibiotics, Miralax, Dulcolax  Sleep Study -  CPAP -   Fasting Blood Sugar -  Checks Blood Sugar _____ times a day  Blood Thinner Instructions: Aspirin Instructions: Last Dose:  Activity level:  Can go up a flight of stairs and perform activities of daily living without stopping and without symptoms of chest pain or shortness of breath.  Able to exercise without symptoms  Unable to go up a flight of stairs without symptoms of     Anesthesia review:   Patient denies shortness of breath, fever, cough and chest pain at PAT appointment  Patient verbalized understanding of instructions that were given to them at the PAT appointment. Patient was also instructed that they will need to review over the PAT instructions again at home before surgery.

## 2021-12-07 ENCOUNTER — Encounter (HOSPITAL_COMMUNITY)
Admission: RE | Admit: 2021-12-07 | Discharge: 2021-12-07 | Disposition: A | Discharge status: Home / Self Care | Payer: Medicare PPO | Source: Ambulatory Visit | Attending: Surgery | Admitting: Surgery

## 2021-12-07 ENCOUNTER — Encounter (HOSPITAL_COMMUNITY): Payer: Self-pay

## 2021-12-07 DIAGNOSIS — Z01812 Encounter for preprocedural laboratory examination: Secondary | ICD-10-CM | POA: Insufficient documentation

## 2021-12-07 DIAGNOSIS — Z01818 Encounter for other preprocedural examination: Secondary | ICD-10-CM

## 2021-12-07 LAB — CBC WITH DIFFERENTIAL/PLATELET
Abs Immature Granulocytes: 0.01 10*3/uL (ref 0.00–0.07)
Basophils Absolute: 0 10*3/uL (ref 0.0–0.1)
Basophils Relative: 1 %
Eosinophils Absolute: 0.1 10*3/uL (ref 0.0–0.5)
Eosinophils Relative: 1 %
HCT: 37 % (ref 36.0–46.0)
Hemoglobin: 12.3 g/dL (ref 12.0–15.0)
Immature Granulocytes: 0 %
Lymphocytes Relative: 20 %
Lymphs Abs: 1.2 10*3/uL (ref 0.7–4.0)
MCH: 31.5 pg (ref 26.0–34.0)
MCHC: 33.2 g/dL (ref 30.0–36.0)
MCV: 94.6 fL (ref 80.0–100.0)
Monocytes Absolute: 0.3 10*3/uL (ref 0.1–1.0)
Monocytes Relative: 5 %
Neutro Abs: 4.4 10*3/uL (ref 1.7–7.7)
Neutrophils Relative %: 73 %
Platelets: 277 10*3/uL (ref 150–400)
RBC: 3.91 MIL/uL (ref 3.87–5.11)
RDW: 12.3 % (ref 11.5–15.5)
WBC: 6 10*3/uL (ref 4.0–10.5)
nRBC: 0 % (ref 0.0–0.2)

## 2021-12-07 LAB — COMPREHENSIVE METABOLIC PANEL
ALT: 15 U/L (ref 0–44)
AST: 17 U/L (ref 15–41)
Albumin: 4 g/dL (ref 3.5–5.0)
Alkaline Phosphatase: 72 U/L (ref 38–126)
Anion gap: 7 (ref 5–15)
BUN: 24 mg/dL — ABNORMAL HIGH (ref 8–23)
CO2: 29 mmol/L (ref 22–32)
Calcium: 9.6 mg/dL (ref 8.9–10.3)
Chloride: 104 mmol/L (ref 98–111)
Creatinine, Ser: 0.84 mg/dL (ref 0.44–1.00)
GFR, Estimated: >60 mL/min (ref >60)
Glucose, Bld: 95 mg/dL (ref 70–99)
Potassium: 3.9 mmol/L (ref 3.5–5.1)
Sodium: 140 mmol/L (ref 135–145)
Total Bilirubin: 0.4 mg/dL (ref 0.3–1.2)
Total Protein: 7.2 g/dL (ref 6.5–8.1)

## 2021-12-07 LAB — HEMOGLOBIN A1C
Hgb A1c MFr Bld: 5.5 % (ref 4.8–5.6)
Mean Plasma Glucose: 111.15 mg/dL

## 2021-12-07 LAB — PROTIME-INR
INR: 1 (ref 0.8–1.2)
Prothrombin Time: 13.2 seconds (ref 11.4–15.2)

## 2021-12-09 ENCOUNTER — Encounter (HOSPITAL_COMMUNITY): Payer: Self-pay | Admitting: Nurse Practitioner

## 2021-12-16 ENCOUNTER — Inpatient Hospital Stay (HOSPITAL_COMMUNITY): Admission: RE | Admit: 2021-12-16 | Payer: Medicare PPO | Source: Ambulatory Visit | Admitting: Surgery

## 2021-12-16 ENCOUNTER — Encounter (HOSPITAL_COMMUNITY): Payer: Self-pay | Admitting: Anesthesiology

## 2021-12-16 ENCOUNTER — Encounter (HOSPITAL_COMMUNITY)
Admission: EM | Disposition: A | Discharge status: Home / Self Care | Payer: Self-pay | Source: Home / Self Care | Attending: Emergency Medicine

## 2021-12-16 ENCOUNTER — Emergency Department (HOSPITAL_COMMUNITY): Payer: Medicare PPO

## 2021-12-16 ENCOUNTER — Emergency Department (HOSPITAL_COMMUNITY)
Admission: EM | Admit: 2021-12-16 | Discharge: 2021-12-16 | Disposition: A | Discharge status: Home / Self Care | Payer: Medicare PPO | Attending: Emergency Medicine | Admitting: Emergency Medicine

## 2021-12-16 ENCOUNTER — Encounter (HOSPITAL_COMMUNITY): Payer: Self-pay | Admitting: Emergency Medicine

## 2021-12-16 DIAGNOSIS — R55 Syncope and collapse: Secondary | ICD-10-CM | POA: Diagnosis not present

## 2021-12-16 DIAGNOSIS — Z23 Encounter for immunization: Secondary | ICD-10-CM | POA: Diagnosis not present

## 2021-12-16 DIAGNOSIS — W19XXXA Unspecified fall, initial encounter: Secondary | ICD-10-CM

## 2021-12-16 DIAGNOSIS — W0110XA Fall on same level from slipping, tripping and stumbling with subsequent striking against unspecified object, initial encounter: Secondary | ICD-10-CM | POA: Diagnosis not present

## 2021-12-16 DIAGNOSIS — S0101XA Laceration without foreign body of scalp, initial encounter: Secondary | ICD-10-CM | POA: Insufficient documentation

## 2021-12-16 DIAGNOSIS — Z043 Encounter for examination and observation following other accident: Secondary | ICD-10-CM | POA: Diagnosis not present

## 2021-12-16 DIAGNOSIS — S0990XA Unspecified injury of head, initial encounter: Secondary | ICD-10-CM | POA: Diagnosis present

## 2021-12-16 DIAGNOSIS — I959 Hypotension, unspecified: Secondary | ICD-10-CM | POA: Diagnosis not present

## 2021-12-16 DIAGNOSIS — R001 Bradycardia, unspecified: Secondary | ICD-10-CM | POA: Diagnosis not present

## 2021-12-16 DIAGNOSIS — R42 Dizziness and giddiness: Secondary | ICD-10-CM | POA: Diagnosis not present

## 2021-12-16 DIAGNOSIS — E876 Hypokalemia: Secondary | ICD-10-CM | POA: Diagnosis not present

## 2021-12-16 DIAGNOSIS — R58 Hemorrhage, not elsewhere classified: Secondary | ICD-10-CM | POA: Diagnosis not present

## 2021-12-16 DIAGNOSIS — S0003XA Contusion of scalp, initial encounter: Secondary | ICD-10-CM | POA: Diagnosis not present

## 2021-12-16 LAB — TYPE AND SCREEN
ABO/RH(D): A POS
Antibody Screen: NEGATIVE

## 2021-12-16 LAB — I-STAT CHEM 8, ED
BUN: 13 mg/dL (ref 8–23)
Calcium, Ion: 1.12 mmol/L — ABNORMAL LOW (ref 1.15–1.40)
Chloride: 93 mmol/L — ABNORMAL LOW (ref 98–111)
Creatinine, Ser: 0.7 mg/dL (ref 0.44–1.00)
Glucose, Bld: 129 mg/dL — ABNORMAL HIGH (ref 70–99)
HCT: 35 % — ABNORMAL LOW (ref 36.0–46.0)
Hemoglobin: 11.9 g/dL — ABNORMAL LOW (ref 12.0–15.0)
Potassium: 3.1 mmol/L — ABNORMAL LOW (ref 3.5–5.1)
Sodium: 131 mmol/L — ABNORMAL LOW (ref 135–145)
TCO2: 28 mmol/L (ref 22–32)

## 2021-12-16 LAB — CBC WITH DIFFERENTIAL/PLATELET
Abs Immature Granulocytes: 0.04 10*3/uL (ref 0.00–0.07)
Basophils Absolute: 0 10*3/uL (ref 0.0–0.1)
Basophils Relative: 0 %
Eosinophils Absolute: 0 10*3/uL (ref 0.0–0.5)
Eosinophils Relative: 0 %
HCT: 32.8 % — ABNORMAL LOW (ref 36.0–46.0)
Hemoglobin: 11.7 g/dL — ABNORMAL LOW (ref 12.0–15.0)
Immature Granulocytes: 0 %
Lymphocytes Relative: 7 %
Lymphs Abs: 0.7 10*3/uL (ref 0.7–4.0)
MCH: 32 pg (ref 26.0–34.0)
MCHC: 35.7 g/dL (ref 30.0–36.0)
MCV: 89.6 fL (ref 80.0–100.0)
Monocytes Absolute: 0.4 10*3/uL (ref 0.1–1.0)
Monocytes Relative: 4 %
Neutro Abs: 8.8 10*3/uL — ABNORMAL HIGH (ref 1.7–7.7)
Neutrophils Relative %: 89 %
Platelets: 245 10*3/uL (ref 150–400)
RBC: 3.66 MIL/uL — ABNORMAL LOW (ref 3.87–5.11)
RDW: 11.9 % (ref 11.5–15.5)
WBC: 9.9 10*3/uL (ref 4.0–10.5)
nRBC: 0 % (ref 0.0–0.2)

## 2021-12-16 SURGERY — CLOSURE, COLOSTOMY, ROBOT-ASSISTED
Anesthesia: General

## 2021-12-16 MED ORDER — LIDOCAINE-EPINEPHRINE-TETRACAINE (LET) TOPICAL GEL: 3.0000 mL | Freq: Once | TOPICAL | Status: DC

## 2021-12-16 MED ORDER — POTASSIUM CHLORIDE CRYS ER 20 MEQ PO TBCR: 20.0000 meq | EXTENDED_RELEASE_TABLET | Freq: Every day | ORAL | 0 refills | Status: DC

## 2021-12-16 MED ORDER — SODIUM CHLORIDE 0.9 % IV SOLN: INTRAVENOUS | Status: DC

## 2021-12-16 MED ORDER — SODIUM CHLORIDE 0.9 % IV BOLUS
500.0000 mL | Freq: Once | INTRAVENOUS | Status: AC
  Administered 2021-12-16: 500 mL via INTRAVENOUS

## 2021-12-16 MED ORDER — TETANUS-DIPHTH-ACELL PERTUSSIS 5-2.5-18.5 LF-MCG/0.5 IM SUSY
0.5000 mL | PREFILLED_SYRINGE | Freq: Once | INTRAMUSCULAR | Status: AC
  Administered 2021-12-16: 0.5 mL via INTRAMUSCULAR
  Filled 2021-12-16: qty 0.5

## 2021-12-16 SURGICAL SUPPLY — 128 items
ADAPTER GOLDBERG URETERAL (ADAPTER) IMPLANT
ADPR CATH 15X14FR FL DRN BG (ADAPTER)
APL PRP STRL LF DISP 70% ISPRP (MISCELLANEOUS) ×1
APPLIER CLIP 5 13 M/L LIGAMAX5 (MISCELLANEOUS)
APPLIER CLIP ROT 10 11.4 M/L (STAPLE)
APR CLP MED LRG 11.4X10 (STAPLE)
APR CLP MED LRG 5 ANG JAW (MISCELLANEOUS)
BAG COUNTER SPONGE SURGICOUNT (BAG) IMPLANT
BAG SPNG CNTER NS LX DISP (BAG)
BAG URO CATCHER STRL LF (MISCELLANEOUS) ×2 IMPLANT
BLADE EXTENDED COATED 6.5IN (ELECTRODE) ×2 IMPLANT
CANNULA REDUC XI 12-8 STAPL (CANNULA)
CANNULA REDUCER 12-8 DVNC XI (CANNULA) IMPLANT
CATH URETL OPEN 5X70 (CATHETERS) IMPLANT
CELLS DAT CNTRL 66122 CELL SVR (MISCELLANEOUS) IMPLANT
CHLORAPREP W/TINT 26 (MISCELLANEOUS) ×2 IMPLANT
CLIP APPLIE 5 13 M/L LIGAMAX5 (MISCELLANEOUS) IMPLANT
CLIP APPLIE ROT 10 11.4 M/L (STAPLE) IMPLANT
CLIP LIGATING HEM O LOK PURPLE (MISCELLANEOUS) IMPLANT
CLIP LIGATING HEMO O LOK GREEN (MISCELLANEOUS) IMPLANT
CLOTH BEACON ORANGE TIMEOUT ST (SAFETY) ×2 IMPLANT
COVER SURGICAL LIGHT HANDLE (MISCELLANEOUS) ×4 IMPLANT
COVER TIP SHEARS 8 DVNC (MISCELLANEOUS) IMPLANT
COVER TIP SHEARS 8MM DA VINCI (MISCELLANEOUS)
DEFOGGER SCOPE WARMER CLEARIFY (MISCELLANEOUS) IMPLANT
DEVICE TROCAR PUNCTURE CLOSURE (ENDOMECHANICALS) IMPLANT
DRAIN CHANNEL 19F RND (DRAIN) ×2 IMPLANT
DRAPE ARM DVNC X/XI (DISPOSABLE) IMPLANT
DRAPE COLUMN DVNC XI (DISPOSABLE) ×1 IMPLANT
DRAPE DA VINCI XI ARM (DISPOSABLE)
DRAPE DA VINCI XI COLUMN (DISPOSABLE) ×2
DRAPE SURG IRRIG POUCH 19X23 (DRAPES) ×2 IMPLANT
DRSG OPSITE POSTOP 4X10 (GAUZE/BANDAGES/DRESSINGS) IMPLANT
DRSG OPSITE POSTOP 4X6 (GAUZE/BANDAGES/DRESSINGS) IMPLANT
DRSG OPSITE POSTOP 4X8 (GAUZE/BANDAGES/DRESSINGS) IMPLANT
DRSG TEGADERM 2-3/8X2-3/4 SM (GAUZE/BANDAGES/DRESSINGS) ×10 IMPLANT
DRSG TEGADERM 4X4.75 (GAUZE/BANDAGES/DRESSINGS) ×2 IMPLANT
ELECT REM PT RETURN 15FT ADLT (MISCELLANEOUS) ×2 IMPLANT
ENDOLOOP SUT PDS II  0 18 (SUTURE)
ENDOLOOP SUT PDS II 0 18 (SUTURE) IMPLANT
EVACUATOR SILICONE 100CC (DRAIN) ×2 IMPLANT
GAUZE SPONGE 2X2 8PLY STRL LF (GAUZE/BANDAGES/DRESSINGS) ×1 IMPLANT
GAUZE SPONGE 4X4 12PLY STRL (GAUZE/BANDAGES/DRESSINGS) IMPLANT
GLOVE BIO SURGEON STRL SZ7.5 (GLOVE) ×6 IMPLANT
GLOVE INDICATOR 8.0 STRL GRN (GLOVE) ×6 IMPLANT
GLOVE SURG LX 7.5 STRW (GLOVE) ×1
GLOVE SURG LX STRL 7.5 STRW (GLOVE) ×1 IMPLANT
GOWN SRG XL LVL 4 BRTHBL STRL (GOWNS) ×1 IMPLANT
GOWN STRL NON-REIN XL LVL4 (GOWNS) ×2
GOWN STRL REUS W/ TWL LRG LVL3 (GOWN DISPOSABLE) ×1 IMPLANT
GOWN STRL REUS W/ TWL XL LVL3 (GOWN DISPOSABLE) ×5 IMPLANT
GOWN STRL REUS W/TWL LRG LVL3 (GOWN DISPOSABLE) ×2
GOWN STRL REUS W/TWL XL LVL3 (GOWN DISPOSABLE) ×10
GRASPER SUT TROCAR 14GX15 (MISCELLANEOUS) IMPLANT
GUIDEWIRE ANG ZIPWIRE 038X150 (WIRE) IMPLANT
GUIDEWIRE STR DUAL SENSOR (WIRE) IMPLANT
HOLDER FOLEY CATH W/STRAP (MISCELLANEOUS) ×2 IMPLANT
IRRIG SUCT STRYKERFLOW 2 WTIP (MISCELLANEOUS)
IRRIGATION SUCT STRKRFLW 2 WTP (MISCELLANEOUS) IMPLANT
KIT PROCEDURE DA VINCI SI (MISCELLANEOUS)
KIT PROCEDURE DVNC SI (MISCELLANEOUS) IMPLANT
KIT TURNOVER KIT A (KITS) IMPLANT
MANIFOLD NEPTUNE II (INSTRUMENTS) ×2 IMPLANT
NEEDLE INSUFFLATION 14GA 120MM (NEEDLE) ×2 IMPLANT
PACK CARDIOVASCULAR III (CUSTOM PROCEDURE TRAY) IMPLANT
PACK COLON (CUSTOM PROCEDURE TRAY) IMPLANT
PACK CYSTO (CUSTOM PROCEDURE TRAY) IMPLANT
PAD POSITIONING PINK XL (MISCELLANEOUS) IMPLANT
PENCIL SMOKE EVACUATOR (MISCELLANEOUS) IMPLANT
PROTECTOR NERVE ULNAR (MISCELLANEOUS) ×4 IMPLANT
RELOAD STAPLER 3.5X45 BLU DVNC (STAPLE) IMPLANT
RELOAD STAPLER 3.5X60 BLU DVNC (STAPLE) IMPLANT
RELOAD STAPLER 4.3X45 GRN DVNC (STAPLE) IMPLANT
RELOAD STAPLER 4.3X60 GRN DVNC (STAPLE) IMPLANT
RTRCTR WOUND ALEXIS 18CM MED (MISCELLANEOUS)
SCISSORS LAP 5X35 DISP (ENDOMECHANICALS) IMPLANT
SEAL CANN UNIV 5-8 DVNC XI (MISCELLANEOUS) ×4 IMPLANT
SEAL XI 5MM-8MM UNIVERSAL (MISCELLANEOUS) ×8
SEALER VESSEL DA VINCI XI (MISCELLANEOUS)
SEALER VESSEL EXT DVNC XI (MISCELLANEOUS) IMPLANT
SLEEVE ADV FIXATION 5X100MM (TROCAR) IMPLANT
SOLUTION ELECTROLUBE (MISCELLANEOUS) IMPLANT
SPIKE FLUID TRANSFER (MISCELLANEOUS) ×2 IMPLANT
SPONGE GAUZE 2X2 STER 10/PKG (GAUZE/BANDAGES/DRESSINGS) ×1
STAPLER CANNULA SEAL DVNC XI (STAPLE) IMPLANT
STAPLER CANNULA SEAL XI (STAPLE)
STAPLER ECHELON POWER CIR 29 (STAPLE) IMPLANT
STAPLER ECHELON POWER CIR 31 (STAPLE) IMPLANT
STAPLER RELOAD 3.5X45 BLU DVNC (STAPLE)
STAPLER RELOAD 3.5X45 BLUE (STAPLE)
STAPLER RELOAD 3.5X60 BLU DVNC (STAPLE)
STAPLER RELOAD 3.5X60 BLUE (STAPLE)
STAPLER RELOAD 4.3X45 GREEN (STAPLE)
STAPLER RELOAD 4.3X45 GRN DVNC (STAPLE)
STAPLER RELOAD 4.3X60 GREEN (STAPLE)
STAPLER RELOAD 4.3X60 GRN DVNC (STAPLE)
STOPCOCK 4 WAY LG BORE MALE ST (IV SETS) ×4 IMPLANT
SURGILUBE 2OZ TUBE FLIPTOP (MISCELLANEOUS) ×2 IMPLANT
SUT MNCRL AB 4-0 PS2 18 (SUTURE) IMPLANT
SUT PDS AB 1 CT1 27 (SUTURE) IMPLANT
SUT PDS AB 1 TP1 96 (SUTURE) IMPLANT
SUT PROLENE 0 CT 2 (SUTURE) IMPLANT
SUT PROLENE 2 0 KS (SUTURE) IMPLANT
SUT PROLENE 2 0 SH DA (SUTURE) IMPLANT
SUT SILK 2 0 (SUTURE)
SUT SILK 2 0 SH CR/8 (SUTURE) IMPLANT
SUT SILK 2-0 18XBRD TIE 12 (SUTURE) IMPLANT
SUT SILK 3 0 (SUTURE)
SUT SILK 3 0 SH CR/8 (SUTURE) IMPLANT
SUT SILK 3-0 18XBRD TIE 12 (SUTURE) IMPLANT
SUT V-LOC BARB 180 2/0GR6 GS22 (SUTURE)
SUT VIC AB 3-0 SH 18 (SUTURE) IMPLANT
SUT VIC AB 3-0 SH 27 (SUTURE)
SUT VIC AB 3-0 SH 27XBRD (SUTURE) IMPLANT
SUT VICRYL 0 UR6 27IN ABS (SUTURE) IMPLANT
SUTURE V-LC BRB 180 2/0GR6GS22 (SUTURE) IMPLANT
SYR 10ML LL (SYRINGE) ×2 IMPLANT
SYS LAPSCP GELPORT 120MM (MISCELLANEOUS)
SYS WOUND ALEXIS 18CM MED (MISCELLANEOUS)
SYSTEM LAPSCP GELPORT 120MM (MISCELLANEOUS) IMPLANT
SYSTEM WOUND ALEXIS 18CM MED (MISCELLANEOUS) IMPLANT
TAPE UMBILICAL 1/8 X36 TWILL (MISCELLANEOUS) ×2 IMPLANT
TOWEL OR NON WOVEN STRL DISP B (DISPOSABLE) ×2 IMPLANT
TRAY FOLEY MTR SLVR 16FR STAT (SET/KITS/TRAYS/PACK) IMPLANT
TROCAR ADV FIXATION 5X100MM (TROCAR) ×2 IMPLANT
TUBING CONNECTING 10 (TUBING) ×6 IMPLANT
TUBING INSUFFLATION 10FT LAP (TUBING) ×2 IMPLANT
TUBING UROLOGY SET (TUBING) IMPLANT

## 2021-12-16 NOTE — ED Provider Notes (Signed)
[facility] EMERGENCY DEPARTMENT Provider Note   CSN: [id number] Arrival date & time: 12/16/21  0433     History  Chief Complaint  Patient presents with   Loss of Consciousness    Carla Ramirez is a 71 y.o. female.  The history is provided by the patient.  Fall This is a new problem. The current episode started less than 1 hour ago. The problem occurs constantly. The problem has been resolved. Pertinent negatives include no chest pain, no abdominal pain, no headaches and no shortness of breath. Nothing aggravates the symptoms. Nothing relieves the symptoms. She has tried nothing for the symptoms. The treatment provided no relief.       Home Medications Prior to Admission medications   Medication Sig Start Date End Date Taking? Authorizing Provider  acetaminophen (TYLENOL) 650 MG CR tablet Take 1,300 mg by mouth at bedtime as needed for pain.    [provider]  ALPRAZolam Duanne Moron) 0.25 MG tablet Take 0.25 mg by mouth daily. 11/04/12   [provider]  amLODIPine-Valsartan-HCTZ 5-160-12.5 MG TABS Take 1 tablet by mouth daily.    [provider]  atenolol (TENORMIN) 100 MG tablet Take 100 mg by mouth every evening. 11/28/17   [provider]  Calcium-Cholecalciferol-Zinc (VIACTIV CALCIUM IMMUNE) 650-20-5.5 MG-MCG-MG CHEW Chew 1 tablet by mouth daily.    [provider]  Cholecalciferol (VITAMIN D3) 50 MCG (2000 UT) capsule Take 2,000 Units by mouth daily.    [provider]  docusate sodium (COLACE) 100 MG capsule Take 100 mg by mouth 2 (two) times daily.    [provider]  Multiple Vitamins-Minerals (ZINC PO) Take 1 tablet by mouth daily.    [provider]  nystatin-triamcinolone ointment (MYCOLOG) Apply 1 Application topically 2 (two) times daily. 11/22/21   Chrzanowski, Jami B, NP  polyethylene glycol powder (MIRALAX) 17 GM/SCOOP powder Take 17 g by mouth daily.    [provider]   Propylene Glycol (SYSTANE BALANCE) 0.6 % SOLN Place 1 drop into both eyes daily as needed (dry eyes).    [provider]      Allergies    Sulfa antibiotics    Review of Systems   Review of Systems  Constitutional:  Negative for fever.  HENT:  Negative for voice change.   Respiratory:  Negative for shortness of breath.   Cardiovascular:  Negative for chest pain.  Gastrointestinal:  Negative for abdominal pain.  Musculoskeletal:  Negative for neck pain.  Skin:  Negative for wound.  Neurological:  Negative for headaches.  Psychiatric/Behavioral:  Negative for agitation.   All other systems reviewed and are negative.   Physical Exam Updated Vital Signs BP (!) 165/77 (BP Location: Right Arm)   Pulse (!) 56   Temp 98.4 F (36.9 C) (Oral)   Resp 16   Ht '5\' 2"'$  (1.575 m)   Wt 60.2 kg   LMP 05/30/2003   SpO2 100%   BMI 24.27 kg/m  Physical Exam Vitals and nursing note reviewed.  Constitutional:      General: She is not in acute distress.    Appearance: Normal appearance. She is well-developed.  HENT:     Head: Normocephalic.      Nose: Nose normal.  Eyes:     Pupils: Pupils are equal, round, and reactive to light.  Cardiovascular:     Rate and Rhythm: Normal rate and regular rhythm.     Pulses: Normal pulses.     Heart sounds:  Normal heart sounds.  Pulmonary:     Effort: Pulmonary effort is normal. No respiratory distress.     Breath sounds: Normal breath sounds.  Abdominal:     General: Abdomen is flat. Bowel sounds are normal. There is no distension.     Palpations: Abdomen is soft.     Tenderness: There is no abdominal tenderness. There is no guarding or rebound.  Genitourinary:    Vagina: No vaginal discharge.  Musculoskeletal:        General: Normal range of motion.     Cervical back: Neck supple.  Skin:    General: Skin is warm and dry.     Capillary Refill: Capillary refill takes less than 2 seconds.     Findings: No erythema or rash.   Neurological:     General: No focal deficit present.     Mental Status: She is alert.     Deep Tendon Reflexes: Reflexes normal.  Psychiatric:        Mood and Affect: Mood normal.     ED Results / Procedures / Treatments   Labs (all labs ordered are listed, but only abnormal results are displayed) Labs Reviewed  CBC WITH DIFFERENTIAL/PLATELET  I-STAT CHEM 8, ED    EKG None  Radiology CT Cervical Spine Wo Contrast  Result Date: 12/16/2021 CLINICAL DATA:  Patient passed out. Fall from standing position. EXAM: CT CERVICAL SPINE WITHOUT CONTRAST TECHNIQUE: Multidetector CT imaging of the cervical spine was performed without intravenous contrast. Multiplanar CT image reconstructions were also generated. RADIATION DOSE REDUCTION: This exam was performed according to the departmental dose-optimization program which includes automated exposure control, adjustment of the mA and/or kV according to patient size and/or use of iterative reconstruction technique. COMPARISON:  None Available. FINDINGS: Alignment: No significant listhesis is present. Straightening of the normal cervical lordosis is noted. Skull base and vertebrae: Craniocervical junction is within normal limits. Vertebral body heights are normal. No acute fractures are present. Soft tissues and spinal canal: No prevertebral fluid or swelling. No visible canal hematoma. Disc levels: Multilevel chronic endplate degenerative changes are present. Uncovertebral and facet spurring are worse left than right throughout the cervical spine. Left foraminal narrowing is greatest at C4-5 and C5-6. Posterior elements are fused on the left at C2-3. Central canal stenosis is greatest at C4-5. Upper chest: The lung apices are clear. Thoracic inlet is within normal limits. IMPRESSION: 1. No acute fracture or traumatic subluxation. 2. Multilevel degenerative changes as described. Electronically Signed   By: San Morelle M.D.   On: 12/16/2021 05:48    CT Head Wo Contrast  Result Date: 12/16/2021 CLINICAL DATA:  Facial trauma, penetrating. Patient passed out and hit her head. Scalp laceration. EXAM: CT HEAD WITHOUT CONTRAST TECHNIQUE: Contiguous axial images were obtained from the base of the skull through the vertex without intravenous contrast. RADIATION DOSE REDUCTION: This exam was performed according to the departmental dose-optimization program which includes automated exposure control, adjustment of the mA and/or kV according to patient size and/or use of iterative reconstruction technique. COMPARISON:  None Available. FINDINGS: Brain: No acute infarct, hemorrhage, or mass lesion is present. Mild white matter hypoattenuation is noted. Basal ganglia are intact. The ventricles are of normal size. No significant extraaxial fluid collection is present. Insert normal brainstem Vascular: No hyperdense vessel or unexpected calcification. Skull: A right occipital scalp hematoma and laceration is present. No underlying fracture is present. Extraneous soft tissues are otherwise within normal limits. Sinuses/Orbits: The paranasal sinuses and mastoid air cells  are clear. The globes and orbits are within normal limits. IMPRESSION: 1. Right occipital scalp hematoma and laceration without underlying fracture. 2. No acute intracranial abnormality. 3. Mild white matter disease is mildly advanced for age. This likely reflects the sequela of chronic microvascular ischemia. Electronically Signed   By: San Morelle M.D.   On: 12/16/2021 05:45    Procedures Procedures    Medications Ordered in ED Medications  lidocaine-EPINEPHrine-tetracaine (LET) topical gel (0 mLs Topical Hold 12/16/21 0454)  0.9 %  sodium chloride infusion ( Intravenous New Bag/Given 12/16/21 0508)  Tdap (BOOSTRIX) injection 0.5 mL (0.5 mLs Intramuscular Given 12/16/21 0512)  sodium chloride 0.9 % bolus 500 mL (500 mLs Intravenous New Bag/Given 12/16/21 5038)    ED Course/ Medical  Decision Making/ A&P                           Medical Decision Making Near syncope while on bowel prepping for surgery using the commode  Amount and/or Complexity of Data Reviewed Independent Historian: EMS    Details: see above External Data Reviewed: notes.    Details: previous notes reviewed Labs: ordered. Radiology: ordered and independent interpretation performed.    Details: negative CTs head and c spine Discussion of management or test interpretation with external provider(s): Case d/w Dr. Grandville Silos, call [facility] surgery Case d/w Dr. Catalina Antigua, will attempt to speak with Dr. Dema Severin regarding patient's procedure Case d/w Dr. Dema Severin, will cancel surgery for this am.  To be called with follow up   Risk Prescription drug management. Risk Details: IVF given.  Normotensive.  Well appearing.  Stable for discharge with close follow up.  Strict return precautions given     Final Clinical Impression(s) / ED Diagnoses Final diagnoses:  Fall, initial encounter  Laceration of scalp, initial encounter   Return for intractable cough, coughing up blood, fevers > 100.4 unrelieved by medication, shortness of breath, intractable vomiting, chest pain, shortness of breath, weakness, numbness, changes in speech, facial asymmetry, abdominal pain, passing out, Inability to tolerate liquids or food, cough, altered mental status or any concerns. No signs of systemic illness or infection. The patient is nontoxic-appearing on exam and vital signs are within normal limits.  I have reviewed the triage vital signs and the nursing notes. Pertinent labs & imaging results that were available during my care of the patient were reviewed by me and considered in my medical decision making (see chart for details). After history, exam, and medical workup I feel the patient has been appropriately medically screened and is safe for discharge home. Pertinent diagnoses were discussed with the patient. Patient was given return  precautions.    Sonnie Bias, MD 12/16/21 [phone]

## 2021-12-16 NOTE — Progress Notes (Addendum)
Called Dr. Dema Severin to make him aware that patient was in the ED over at [facility].  Left message for him to return call.   Spoke to Dr Dema Severin this morning, pts procedure is cancelled at this time.   Jasmine Pang BSN, Academic librarian - Perioperative Services [facility]

## 2021-12-16 NOTE — ED Triage Notes (Signed)
BIB EMS per patient, she is schedule for a colostomy reversal surgery today 12/16/21. She was doing a bowel prep that consisted of miralax and gatorade. While completing the bowel prep she passed out and hit the back of her head. Denies blood thinner

## 2021-12-20 ENCOUNTER — Encounter (HOSPITAL_COMMUNITY): Payer: Self-pay | Admitting: Nurse Practitioner

## 2021-12-21 ENCOUNTER — Ambulatory Visit (HOSPITAL_COMMUNITY)
Admission: EM | Admit: 2021-12-21 | Discharge: 2021-12-21 | Disposition: A | Discharge status: Home / Self Care | Payer: Medicare PPO

## 2021-12-21 ENCOUNTER — Encounter (HOSPITAL_COMMUNITY): Payer: Self-pay | Admitting: Emergency Medicine

## 2021-12-21 NOTE — ED Triage Notes (Signed)
Pt here to get out single stitch out of posterior head. Reports that got placed at ED last Friday. Denies any s/s of infection. Only tender with palpation.

## 2021-12-23 ENCOUNTER — Other Ambulatory Visit (HOSPITAL_COMMUNITY): Payer: Self-pay

## 2021-12-28 NOTE — Progress Notes (Unsigned)
PATIENT: Carla Ramirez DOB: 03-07-1951  REASON FOR VISIT: follow up for memory  HISTORY FROM: patient PRIMARY NEUROLOGIST: Carla Ramirez  HISTORY OF PRESENT ILLNESS: Today 12/29/21 Carla Ramirez is here today for follow-up.  Has not been seen since January 2022.  MMSE 30/30. Had perforated colon in January 2023. Has diverticulosis. Has colostomy. Was scheduled for reversal few weeks ago, got dehydrated from the prep, had syncopal episodes, the reversal was delayed to 8/17. Feels some brain fog. Anxiety. Hasn't given up anything due to memory. Does get distracted. She drives, manages the household. Involved in church ministries. 1 example, at car dealership, husband asked for "card" she didn't understand he meant credit card. Uses 1/2 tablet of Xanax daily. Her mother had LBD and parkinson's.   CT Head 12/16/21 IMPRESSION: 1. Right occipital scalp hematoma and laceration without underlying fracture. 2. No acute intracranial abnormality. 3. Mild white matter disease is mildly advanced for age. This likely reflects the sequela of chronic microvascular ischemia.  Update 06/28/2020 SS: Carla Ramirez is a 71 year old female with history of mild memory disturbance.  Her trouble with memory is exacerbated by anxiety.  She is not on any memory medications.  MMSE 30/30 today.  Over the last year, has not noted any changes to the memory.  At times, she may feel scattered, forget what she went into her room for.  She may have difficulty remembering people's names.  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  Has gone back to singing in the choir.  She has Xanax to take if needed for anxiety, rarely has to take.  She did take 1 this morning, she was anxious about the appointment.  She drives a car, does her own ADLs, manages her household.  Has reported some back issues, is seeing orthopedics.  She will be turning 70 in a few days. Does 2 days a  week of exercise group.  Here today for evaluation unaccompanied.  HISTORY 06/26/2019 SS: Carla Ramirez is a 71 year old female with history of mild memory disturbance.  She lives with her husband, performs her own ADLs.  She drives a car without difficulty.  Her trouble with memory is exacerbated by anxiety.  She is not on any memory medication.  She feels her memory is stable, really only has difficulty during times of stress or anxiety.  Due to the pandemic, she really has not had much stress or anxiety lately, therefore her memory has been well.  She does report at times she has difficulty remembering people's names, or may lose her train of thought, but she thinks this may just be normal with age.  She does report that her mother had dementia.  She has Xanax she may take as needed for anxiety.  Overall, she has been doing well, no new problems or concerns.  She does take medication for blood pressure.  She has been doing a lot of reading, and walks regularly for exercise.  She presents today for follow-up unaccompanied.    REVIEW OF SYSTEMS: Out of a complete 14 system review of symptoms, the patient complains only of the following symptoms, and all other reviewed systems are negative.  See HPI  ALLERGIES: Allergies  Allergen Reactions   Sulfa Antibiotics Swelling    Facial swelling    HOME MEDICATIONS: Outpatient Medications Prior to Visit  Medication Sig Dispense Refill   acetaminophen (TYLENOL) 650 MG CR tablet Take 650  mg by mouth at bedtime as needed for pain.     ALPRAZolam (XANAX) 0.25 MG tablet Take 0.125 mg by mouth in the morning.     amLODIPine-Valsartan-HCTZ 5-160-12.5 MG TABS Take 1 tablet by mouth daily.     atenolol (TENORMIN) 100 MG tablet Take 100 mg by mouth every evening.  7   Calcium-Cholecalciferol-Zinc (VIACTIV CALCIUM IMMUNE) 650-20-5.5 MG-MCG-MG CHEW Chew 1 tablet by mouth daily.     Cholecalciferol (VITAMIN D3) 50 MCG (2000 UT) capsule Take 2,000 Units by mouth daily.      docusate sodium (COLACE) 100 MG capsule Take 100 mg by mouth 2 (two) times daily.     Multiple Vitamins-Minerals (ZINC PO) Take 1 tablet by mouth daily.     nystatin-triamcinolone ointment (MYCOLOG) Apply 1 Application topically 2 (two) times daily. (Patient taking differently: Apply 1 Application topically 2 (two) times daily as needed (yeast rash).) 30 g 1   polyethylene glycol powder (MIRALAX) 17 GM/SCOOP powder Take 17 g by mouth daily.     Propylene Glycol (SYSTANE BALANCE) 0.6 % SOLN Place 1 drop into both eyes daily as needed (dry eyes).     potassium chloride SA (KLOR-CON M) 20 MEQ tablet Take 1 tablet (20 mEq total) by mouth daily. (Patient not taking: Reported on 12/22/2021) 4 tablet 0   No facility-administered medications prior to visit.    PAST MEDICAL HISTORY: Past Medical History:  Diagnosis Date   Acid reflux    Anxiety    Diverticulosis    Hiatal hernia    Hypertension    off medication since 9/13   Hypertension    Iron deficiency anemia 03/19/2014   Leukopenia 03/19/2014   Melanoma ([place]) 2018   On back.  Dr. Ubaldo Glassing follows.   Memory change 08/16/2015   Spinal stenosis     PAST SURGICAL HISTORY: Past Surgical History:  Procedure Laterality Date   APPENDECTOMY     COLONOSCOPY WITH ESOPHAGOGASTRODUODENOSCOPY (EGD)     COLOSTOMY     LAPAROTOMY N/A 06/27/2021   Procedure: EXPLORATORY LAPAROTOMY, SIGMOID COLECTOMY WITH COLOSTOMY;  Surgeon: Jovita Kussmaul, MD;  Location: [facility];  Service: General;  Laterality: N/A;   RENAL ANGIOGRAPHY Right 02/05/2019   Procedure: RENAL ANGIOGRAPHY;  Surgeon: Marty Heck, MD;  Location: [facility];  Service: Cardiovascular;  Laterality: Right;    FAMILY HISTORY: Family History  Problem Relation Age of Onset   Stroke Mother    Dementia Mother    Lymphoma Brother             SOCIAL HISTORY: Social History   Socioeconomic History   Marital status: Married    Spouse name: Not on file   Number of  children: 3   Years of education: Masters   Highest education level: Not on file  Occupational History   Not on file  Tobacco Use   Smoking status: Never   Smokeless tobacco: Never  Vaping Use   Vaping Use: Never used  Substance and Sexual Activity   Alcohol use: Yes    Comment: 2 glasses of wine/month   Drug use: No   Sexual activity: Not Currently    Partners: Male    Birth control/protection: Post-menopausal  Other Topics Concern   Not on file  Social History Narrative   Married lives with hsuband at home.  Retired.  Has Masters degree.  Has 3 children.    Right-handed   Caffeine:   Social Determinants of Health   Financial Resource Strain:  Not on file  Food Insecurity: Not on file  Transportation Needs: Not on file  Physical Activity: Not on file  Stress: Not on file  Social Connections: Not on file  Intimate Partner Violence: Not on file   PHYSICAL EXAM  Vitals:   12/29/21 0839  BP: (!) 150/79  Pulse: (!) 55  Weight: 134 lb (60.8 kg)  Height: '5\' 2"'$  (1.575 m)    Body mass index is 24.51 kg/m.  Generalized: Well developed, in no acute distress     12/29/2021    8:42 AM 06/28/2020   10:07 AM 06/26/2019   11:11 AM  MMSE - Mini Mental State Exam  Orientation to time '5 5 5  '$ Orientation to Place '5 5 5  '$ Registration '3 3 3  '$ Attention/ Calculation '5 5 4  '$ Recall '3 3 3  '$ Language- name 2 objects '2 2 2  '$ Language- repeat '1 1 1  '$ Language- follow 3 step command '3 3 3  '$ Language- read & follow direction '1 1 1  '$ Write a sentence '1 1 1  '$ Copy design '1 1 1  '$ Copy design-comments   named 14 animals  Total score '30 30 29   '$ Neurological examination  Mentation: Alert oriented to time, place, history taking. Follows all commands speech and language fluent Cranial nerve II-XII: Pupils were equal round reactive to light. Extraocular movements were full, visual field were full on confrontational test. Facial sensation and strength were normal.  Head turning and shoulder shrug   were normal and symmetric. Motor: The motor testing reveals 5 over 5 strength of all 4 extremities. Good symmetric motor tone is noted throughout.  Sensory: Sensory testing is intact to soft touch on all 4 extremities. No evidence of extinction is noted.  Coordination: Cerebellar testing reveals good finger-nose-finger and heel-to-shin bilaterally.  Gait and station: Gait is normal.  Reflexes: Deep tendon reflexes are symmetric and normal bilaterally.   DIAGNOSTIC DATA (LABS, IMAGING, TESTING) - I reviewed patient records, labs, notes, testing and imaging myself where available.  Lab Results  Component Value Date   WBC 9.9 12/16/2021   HGB 11.9 (L) 12/16/2021   HCT 35.0 (L) 12/16/2021   MCV 89.6 12/16/2021   PLT 245 12/16/2021      Component Value Date/Time   NA 131 (L) 12/16/2021 0632   K 3.1 (L) 12/16/2021 0632   CL 93 (L) 12/16/2021 0632   CO2 29 12/07/2021 1335   GLUCOSE 129 (H) 12/16/2021 0632   BUN 13 12/16/2021 0632   CREATININE 0.70 12/16/2021 0632   CALCIUM 9.6 12/07/2021 1335   PROT 7.2 12/07/2021 1335   ALBUMIN 4.0 12/07/2021 1335   AST 17 12/07/2021 1335   ALT 15 12/07/2021 1335   ALKPHOS 72 12/07/2021 1335   BILITOT 0.4 12/07/2021 1335   GFRNONAA >60 12/07/2021 1335   No results found for: "CHOL", "HDL", "LDLCALC", "LDLDIRECT", "TRIG", "CHOLHDL" Lab Results  Component Value Date   HGBA1C 5.5 12/07/2021   Lab Results  Component Value Date   [id number] 062 08/16/2015   No results found for: "TSH"  ASSESSMENT AND PLAN 71 y.o. year old female   1.  Mild memory disturbance  -MMSE 30/30 -Since last seen, under more stress with medical issues, family stress -Discussed considering low dose anxiety medication with PCP  -Return back in 6 months for memory recheck, consider Aricept   Butler Denmark, AGNP-C, DNP 12/29/2021, 8:54 AM [facility] [address] [place], [place] [address] [phone]

## 2021-12-29 ENCOUNTER — Ambulatory Visit: Payer: Medicare PPO | Admitting: Neurology

## 2021-12-29 ENCOUNTER — Encounter: Payer: Self-pay | Admitting: Neurology

## 2021-12-29 VITALS — BP 150/79 | HR 55 | Ht 62.0 in | Wt 134.0 lb

## 2021-12-29 DIAGNOSIS — R413 Other amnesia: Secondary | ICD-10-CM | POA: Diagnosis not present

## 2021-12-29 NOTE — Patient Instructions (Signed)
Memory test was 30/30 I will see you back in 6 months We can continue to monitor the memory

## 2021-12-29 NOTE — Progress Notes (Signed)
Lelon Huh at Dr. Orest Dikes- requested orders for this patient that I'm seeing on Monday for PST interview. I also sent a staff message to [place] at Dr. Marcello Moores' office for orders as well.

## 2021-12-29 NOTE — Progress Notes (Addendum)
COVID Vaccine received:  '[]'$  No '[x]'$  Yes  Date of any COVID positive Test in last 90 days: None  PCP - Jonathon Jordan, MD Cardiologist - None Neurologist - Margette Fast, MD  Butler Denmark, NP  Chest x-ray -  EKG -  08-23-21  Epic  Repeated EKG at PST d/t patient c/o Chest tightness. EKG approved by Levada Dy  Stress Test -  ECHO -  Cardiac Cath -   Pacemaker/ICD device     '[x]'$  N/A Spinal Cord Stimulator:'[x]'$  No '[]'$  Yes   Other Implants:   Bowel Prep - as Per Dr. Dema Severin, Neomycin, Flagyl, Mirlax, [place] and Clear liquids the day prior to surgery.   History of Sleep Apnea? '[x]'$  No '[]'$  Yes  '[]'$  unknown Sleep Study Date:   CPAP used?- '[x]'$  No '[]'$  Yes    Does the patient monitor blood sugar? '[]'$  No '[]'$  Yes  '[x]'$  N/A  Blood Thinner Instructions:None Aspirin Instructions:None Last Dose:  ERAS Protocol Ordered: '[]'$  No  '[x]'$  Yes PRE-SURGERY '[x]'$  ENSURE  X  3 per Dr. Orest Dikes order   Comments:   Activity level: Patient can not do a flight of stairs without difficulty, would have SOB  Anesthesia review: Mild Memory disorder,  patient c/o "funny feeling in her chest and tightness" for the past week. She had this tightness today when walking in to [facility] for her PST appt. I had our NP come and talk to her and it was decided to repeat an EKG on her today.   Patient denies fever, cough at PAT appointment. She does have chest heaviness and some SOB  today.   Patient verbalized understanding and agreement to the Pre-Surgical Instructions that were given to them at this PAT appointment. Patient was also educated of the need to review these PAT instructions again prior to his/her surgery.I reviewed the appropriate phone numbers to call if they have any and questions or concerns.

## 2021-12-29 NOTE — Patient Instructions (Addendum)
DUE TO SPACE LIMITATIONS, ONLY TWO VISITORS  (aged 71 and older) ARE ALLOWED TO COME WITH YOU AND STAY IN THE WAITING ROOM DURING YOUR PRE OP AND PROCEDURE.   **NO VISITORS ARE ALLOWED IN THE SHORT STAY AREA OR RECOVERY ROOM!!**  IF YOU WILL BE ADMITTED INTO THE HOSPITAL YOU ARE ALLOWED ONLY FOUR SUPPORT PEOPLE DURING VISITATION HOURS (7 AM -8PM)   The support person(s) must pass our screening, and use Hand sanitizing gel. Visitors GUEST BADGE MUST BE WORN VISIBLY  One adult visitor may remain with you overnight and MUST be in the room by 8 P.M.   You are not required to quarantine at this time prior to your surgery. However, you must do this: Hand Hygiene often Do NOT share personal items Notify your provider if you are in close contact with someone who has COVID or you develop fever 100.4 or greater, new onset of sneezing, cough, sore throat, shortness of breath or body aches.       Your procedure is scheduled on:  Thursday January 12, 2022  Report to [facility] Main Entrance.  Report to admitting at: 08:15  AM  +++++Call this number if you have any questions or problems the morning of surgery [phone]  Do not eat food :After Midnight the night prior to your surgery/procedure.  After Midnight you may have the following liquids until    07:30  AM DAY OF SURGERY  Clear Liquid Diet Water Black Coffee (sugar ok, NO MILK/CREAM OR CREAMERS)  Tea (sugar ok, NO MILK/CREAM OR CREAMERS) regular and decaf                             Plain Jell-O (NO RED)                                           Fruit ices (not with fruit pulp, NO RED)                                     Popsicles (NO RED)                                                                  Juice: apple, WHITE grape, WHITE cranberry Sports drinks like Gatorade (NO RED)                 Dulcolax 20 mg - Take  one (1) tablet with water at 07:00 am the day prior to surgery.  Miralax 255 g - Mix with 64 oz  Gatorade/Powerade.  Starting at 10:00 am ,Drink this gradually over the next few hours (8 oz glass every 15-30 minutes) until gone the day prior to surgery You should finish in 4 hours-6 hours.    Neomycin 1000 mg - At 2 pm, 3 pm and 10 pm after Miralax  bowel prep the day prior to surgery.  Metronidazole 1000 mg - At 2 pm, 3 pm and 10 pm after Miralax bowel prep the day prior to surgery.   Drink plenty of  clear liquids all evening to avoid getting dehydrated. This is in addition to drinking 2 (two) Pre-Surgery Clear Ensure.    The day of surgery:  Drink ONE (1) Pre-Surgery Clear Ensure or G2 at  07 :15      AM the morning of surgery. Drink in one sitting. Do not sip.  This drink was given to you during your hospital pre-op appointment visit. Nothing else to drink after completing the Pre-Surgery Clear Ensure   FOLLOW BOWEL PREP AND ANY ADDITIONAL PRE OP INSTRUCTIONS YOU RECEIVED FROM YOUR SURGEON'S OFFICE!!!   Oral Hygiene is also important to reduce your risk of infection.        Remember - BRUSH YOUR TEETH THE MORNING OF SURGERY WITH YOUR REGULAR TOOTHPASTE  Do NOT smoke after Midnight the night before surgery.  Take ONLY these medicines the morning of surgery with A SIP OF WATER: Xanax, Atenolol, and if needed you can take Tylenol and use Systane eye drops.    Bring CPAP mask and tubing day of surgery.                   You may not have any metal on your body including hair pins, jewelry, and body piercing  Do not wear make-up, lotions, powders, perfumes/cologne, or deodorant  Do not wear nail polish including gel and S&S, artificial / acrylic nails, or any other type of covering on natural nails including finger and toenails. If you have artificial nails, gel coating, etc., that needs to be removed by a nail salon, Please have this removed prior to surgery. Not doing so may mean that your surgery could be cancelled or delayed if the Surgeon or anesthesia staff feels like they are  unable to monitor you safely.   Do not shave 48 hours prior to surgery to avoid nicks in your skin which may contribute to postoperative infections.   Contacts, Hearing Aids, dentures or bridgework may not be worn into surgery.   You may bring a small overnight bag with you on the day of surgery, only pack items that are not valuable .[facility] IS NOT RESPONSIBLE   FOR VALUABLES THAT ARE LOST OR STOLEN.   DO NOT [place]. PHARMACY WILL DISPENSE MEDICATIONS LISTED ON YOUR MEDICATION LIST TO YOU DURING YOUR ADMISSION [place]!   Special Instructions: Bring a copy of your healthcare power of attorney and living will documents the day of surgery, if you wish to have them scanned into your [facility] Records- EPIC  Please read over the following fact sheets you were given: IF YOU HAVE QUESTIONS ABOUT YOUR PRE-OP INSTRUCTIONS, PLEASE CALL [phone]  ([place])   [facility] - Preparing for Surgery Before surgery, you can play an important role.  Because skin is not sterile, your skin needs to be as free of germs as possible.  You can reduce the number of germs on your skin by washing with CHG (chlorahexidine gluconate) soap before surgery.  CHG is an antiseptic cleaner which kills germs and bonds with the skin to continue killing germs even after washing. Please DO NOT use if you have an allergy to CHG or antibacterial soaps.  If your skin becomes reddened/irritated stop using the CHG and inform your nurse when you arrive at Short Stay. Do not shave (including legs and underarms) for at least 48 hours prior to the first CHG shower.  You may shave your face/neck.  Please follow these instructions carefully:  1.  Shower with CHG Soap the night before surgery and the  morning of surgery.  2.  If you choose to wash your hair, wash your hair first as usual with your normal  shampoo.  3.  After you shampoo, rinse your hair and body thoroughly to remove  the shampoo.                             4.  Use CHG as you would any other liquid soap.  You can apply chg directly to the skin and wash.  Gently with a scrungie or clean washcloth.  5.  Apply the CHG Soap to your body ONLY FROM THE NECK DOWN.   Do not use on face/ open                           Wound or open sores. Avoid contact with eyes, ears mouth and genitals (private parts).                       Wash face,  Genitals (private parts) with your normal soap.             6.  Wash thoroughly, paying special attention to the area where your  surgery  will be performed.  7.  Thoroughly rinse your body with warm water from the neck down.  8.  DO NOT shower/wash with your normal soap after using and rinsing off the CHG Soap.            9.  Pat yourself dry with a clean towel.            10.  Wear clean pajamas.            11.  Place clean sheets on your bed the night of your first shower and do not  sleep with pets.  ON THE DAY OF SURGERY : Do not apply any lotions/deodorants the morning of surgery.  Please wear clean clothes to the hospital/surgery center.    FAILURE TO FOLLOW THESE INSTRUCTIONS MAY RESULT IN THE CANCELLATION OF YOUR SURGERY  PATIENT SIGNATURE_________________________________  NURSE SIGNATURE__________________________________  ________________________________________________________________________   Adam Phenix    An incentive spirometer is a tool that can help keep your lungs clear and active. This tool measures how well you are filling your lungs with each breath. Taking long deep breaths may help reverse or decrease the chance of developing breathing (pulmonary) problems (especially infection) following: A long period of time when you are unable to move or be active. BEFORE THE PROCEDURE  If the spirometer includes an indicator to show your best effort, your nurse or respiratory therapist will set it to a desired goal. If possible, sit up straight or lean  slightly forward. Try not to slouch. Hold the incentive spirometer in an upright position. INSTRUCTIONS FOR USE  Sit on the edge of your bed if possible, or sit up as far as you can in bed or on a chair. Hold the incentive spirometer in an upright position. Breathe out normally. Place the mouthpiece in your mouth and seal your lips tightly around it. Breathe in slowly and as deeply as possible, raising the piston or the ball toward the top of the column. Hold your breath for 3-5 seconds or for as long as possible. Allow the piston or ball to fall to the bottom of the column. Remove  the mouthpiece from your mouth and breathe out normally. Rest for a few seconds and repeat Steps 1 through 7 at least 10 times every 1-2 hours when you are awake. Take your time and take a few normal breaths between deep breaths. The spirometer may include an indicator to show your best effort. Use the indicator as a goal to work toward during each repetition. After each set of 10 deep breaths, practice coughing to be sure your lungs are clear. If you have an incision (the cut made at the time of surgery), support your incision when coughing by placing a pillow or rolled up towels firmly against it. Once you are able to get out of bed, walk around indoors and cough well. You may stop using the incentive spirometer when instructed by your caregiver.  RISKS AND COMPLICATIONS Take your time so you do not get dizzy or light-headed. If you are in pain, you may need to take or ask for pain medication before doing incentive spirometry. It is harder to take a deep breath if you are having pain. AFTER USE Rest and breathe slowly and easily. It can be helpful to keep track of a log of your progress. Your caregiver can provide you with a simple table to help with this. If you are using the spirometer at home, follow these instructions: Sarah Ann IF:  You are having difficultly using the spirometer. You have trouble  using the spirometer as often as instructed. Your pain medication is not giving enough relief while using the spirometer. You develop fever of 100.5 F (38.1 C) or higher.                                                                                                    SEEK IMMEDIATE MEDICAL CARE IF:  You cough up bloody sputum that had not been present before. You develop fever of 102 F (38.9 C) or greater. You develop worsening pain at or near the incision site. MAKE SURE YOU:  Understand these instructions. Will watch your condition. Will get help right away if you are not doing well or get worse. Document Released: 09/25/2006 Document Revised: 08/07/2011 Document Reviewed: 11/26/2006 [facility] Patient Information 2014 [place], [place].

## 2021-12-30 ENCOUNTER — Ambulatory Visit: Payer: Self-pay | Admitting: Surgery

## 2021-12-30 DIAGNOSIS — Z01818 Encounter for other preprocedural examination: Secondary | ICD-10-CM

## 2022-01-02 ENCOUNTER — Encounter (HOSPITAL_COMMUNITY): Payer: Self-pay

## 2022-01-02 ENCOUNTER — Encounter (HOSPITAL_COMMUNITY)
Admission: RE | Admit: 2022-01-02 | Discharge: 2022-01-02 | Disposition: A | Discharge status: Home / Self Care | Payer: Medicare PPO | Source: Ambulatory Visit | Attending: Surgery | Admitting: Surgery

## 2022-01-02 ENCOUNTER — Other Ambulatory Visit: Payer: Self-pay

## 2022-01-02 VITALS — BP 165/86 | HR 58 | Temp 98.6°F | Resp 20 | Ht 62.0 in | Wt 132.0 lb

## 2022-01-02 DIAGNOSIS — Z933 Colostomy status: Secondary | ICD-10-CM | POA: Diagnosis not present

## 2022-01-02 DIAGNOSIS — I119 Hypertensive heart disease without heart failure: Secondary | ICD-10-CM | POA: Diagnosis not present

## 2022-01-02 DIAGNOSIS — R799 Abnormal finding of blood chemistry, unspecified: Secondary | ICD-10-CM | POA: Insufficient documentation

## 2022-01-02 DIAGNOSIS — R0789 Other chest pain: Secondary | ICD-10-CM

## 2022-01-02 DIAGNOSIS — I10 Essential (primary) hypertension: Secondary | ICD-10-CM

## 2022-01-02 DIAGNOSIS — Z01818 Encounter for other preprocedural examination: Secondary | ICD-10-CM | POA: Diagnosis not present

## 2022-01-02 DIAGNOSIS — K5720 Diverticulitis of large intestine with perforation and abscess without bleeding: Secondary | ICD-10-CM | POA: Insufficient documentation

## 2022-01-02 DIAGNOSIS — D509 Iron deficiency anemia, unspecified: Secondary | ICD-10-CM | POA: Diagnosis not present

## 2022-01-02 HISTORY — DX: Personal history of other diseases of the digestive system: Z87.19

## 2022-01-02 HISTORY — DX: Unspecified osteoarthritis, unspecified site: M19.90

## 2022-01-02 LAB — CBC WITH DIFFERENTIAL/PLATELET
Abs Immature Granulocytes: 0.01 10*3/uL (ref 0.00–0.07)
Basophils Absolute: 0 10*3/uL (ref 0.0–0.1)
Basophils Relative: 0 %
Eosinophils Absolute: 0.1 10*3/uL (ref 0.0–0.5)
Eosinophils Relative: 2 %
HCT: 37.2 % (ref 36.0–46.0)
Hemoglobin: 12.6 g/dL (ref 12.0–15.0)
Immature Granulocytes: 0 %
Lymphocytes Relative: 21 %
Lymphs Abs: 1.1 10*3/uL (ref 0.7–4.0)
MCH: 31.8 pg (ref 26.0–34.0)
MCHC: 33.9 g/dL (ref 30.0–36.0)
MCV: 93.9 fL (ref 80.0–100.0)
Monocytes Absolute: 0.3 10*3/uL (ref 0.1–1.0)
Monocytes Relative: 6 %
Neutro Abs: 3.7 10*3/uL (ref 1.7–7.7)
Neutrophils Relative %: 71 %
Platelets: 283 10*3/uL (ref 150–400)
RBC: 3.96 MIL/uL (ref 3.87–5.11)
RDW: 12.2 % (ref 11.5–15.5)
WBC: 5.3 10*3/uL (ref 4.0–10.5)
nRBC: 0 % (ref 0.0–0.2)

## 2022-01-02 LAB — COMPREHENSIVE METABOLIC PANEL
ALT: 13 U/L (ref 0–44)
AST: 18 U/L (ref 15–41)
Albumin: 4 g/dL (ref 3.5–5.0)
Alkaline Phosphatase: 91 U/L (ref 38–126)
Anion gap: 10 (ref 5–15)
BUN: 23 mg/dL (ref 8–23)
CO2: 25 mmol/L (ref 22–32)
Calcium: 9.3 mg/dL (ref 8.9–10.3)
Chloride: 102 mmol/L (ref 98–111)
Creatinine, Ser: 0.82 mg/dL (ref 0.44–1.00)
GFR, Estimated: >60 mL/min (ref >60)
Glucose, Bld: 98 mg/dL (ref 70–99)
Potassium: 3.9 mmol/L (ref 3.5–5.1)
Sodium: 137 mmol/L (ref 135–145)
Total Bilirubin: 0.6 mg/dL (ref 0.3–1.2)
Total Protein: 7.4 g/dL (ref 6.5–8.1)

## 2022-01-02 NOTE — Progress Notes (Addendum)
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    DISCUSSION: Pt is 71 years old with hx HTN, iron deficiency anemia  S/p ex lap, sigmoid colectomy and colostomy 06/27/21  Asked to see patient in pre-surgical testing. Pt reports 1 week hx of intermittent "chest heaviness" or "flutter." Pt has difficulty qualifying the sensation. Occurs in her middle upper chest, typically when active such as when walking. Feels slightly SOB with sensation. Never occurs at rest. Denies nausea or diaphoresis with episodes but does feel anxious (however pt reports she is frequently anxious). Pt had syncopal episode 12/16/21 associated with bowel prep for colostomy takedown - surgery was postponed - otherwise no dizziness. Denies palpitations. Pt is worried she will have MI during surgery. Has never seen cardiology before.   Reviewed case with Dr. Christella Hartigan. Pt will need cardiology eval prior to surgery. I notified Charmella in Dr. Orest Dikes office.    VS: BP (!) 165/86   Pulse (!) 58   Temp 37 C (Oral)   Resp 20   Ht '5\' 2"'$  (1.575 m)   Wt 59.9 kg   LMP 05/30/2003   SpO2 98%   BMI 24.14 kg/m   PROVIDERS: - PCP is Jonathon Jordan, MD   LABS: Labs reviewed: Acceptable for surgery. (all labs ordered are listed, but only abnormal results are displayed)  Labs Reviewed  CBC WITH DIFFERENTIAL/PLATELET  COMPREHENSIVE METABOLIC PANEL   TYPE AND SCREEN     EKG 01/02/22: Sinus bradycardia 52 bpm. Possible Left atrial enlargement. Nonspecific ST abnormality   CV: NA  Past Medical History:  Diagnosis Date   Acid reflux    Anxiety    Arthritis    Diverticulosis    History of hiatal hernia    Hypertension    Iron deficiency anemia 03/19/2014   Leukopenia 03/19/2014   Melanoma ([place]) 2018   On back.  Dr. Ubaldo Glassing follows.   Memory change 08/16/2015   Spinal stenosis     Past Surgical History:  Procedure Laterality Date   APPENDECTOMY     COLONOSCOPY WITH ESOPHAGOGASTRODUODENOSCOPY (EGD)     COLOSTOMY     LAPAROTOMY N/A 06/27/2021   Procedure: EXPLORATORY LAPAROTOMY, SIGMOID COLECTOMY WITH COLOSTOMY;  Surgeon: Jovita Kussmaul, MD;  Location[facility];  Service: General;  Laterality: N/A;   RENAL ANGIOGRAPHY Right 02/05/2019   Procedure: RENAL ANGIOGRAPHY;  Surgeon: Marty Heck, MD;  Location: [facility];  Service: Cardiovascular;  Laterality: Right;    MEDICATIONS:  acetaminophen (TYLENOL) 650 MG CR tablet   ALPRAZolam (XANAX) 0.25 MG tablet   amLODIPine-Valsartan-HCTZ 5-160-12.5 MG TABS   atenolol (TENORMIN) 100 MG tablet   Calcium-Cholecalciferol-Zinc (VIACTIV CALCIUM IMMUNE) 650-20-5.5 MG-MCG-MG CHEW   Cholecalciferol (VITAMIN D3) 50 MCG (2000 UT) capsule   docusate sodium (COLACE) 100 MG capsule   Multiple Vitamins-Minerals (ZINC PO)   nystatin-triamcinolone ointment (MYCOLOG)  polyethylene glycol powder (MIRALAX) 17 GM/SCOOP powder   Propylene Glycol (SYSTANE BALANCE) 0.6 % SOLN   No current facility-administered medications for this encounter.    Willeen Cass, PhD, FNP-BC [facility] Short Stay Surgical Center/Anesthesiology Phone: [phone] 01/06/2022 3:12 PM

## 2022-01-05 ENCOUNTER — Telehealth: Payer: Self-pay | Admitting: *Deleted

## 2022-01-05 NOTE — Progress Notes (Signed)
Cardiology Office Note   Date:  01/06/2022   ID:  Carla Ramirez, DOB [birth year], MRN [id number]  PCP:  Jonathon Jordan, MD    No chief complaint on file.  Preoperative cardiovascular exam  Wt Readings from Last 3 Encounters:  01/06/22 132 lb 9.6 oz (60.1 kg)  01/02/22 132 lb (59.9 kg)  12/29/21 134 lb (60.8 kg)       History of Present Illness: Carla Ramirez is a 71 y.o. female who is being seen today for the evaluation of preoperative cardiovascular exam at the request of Jonathon Jordan, MD.   She needs robotic assisted reversal of colostomy.  Surgery June 27, 2021 with Dr. Marlou Starks.  No cardiac problems at that time.  Was supposed to have surgery in July 2023, but after the prep, she passed out when trying to get up in the middle of the night.  She had several tests after hitting her head.   Since July, she has done well.  Rare palpitations- lasting seconds; episodes are rare. Worse with anxiety.  No associated sx.    Can walk up stairs without problems.  Does exercise class and has no sx.    In humid weather, breathing can feel different, but does make her stop walking.  Denies : Chest pain. Dizziness. Leg edema. Nitroglycerin use. Orthopnea. Palpitations. Paroxysmal nocturnal dyspnea.  Syncope.    Past Medical History:  Diagnosis Date   Acid reflux    Anxiety    Arthritis    Diverticulosis    History of hiatal hernia    Hypertension    Iron deficiency anemia 03/19/2014   Leukopenia 03/19/2014   Melanoma ([place]) 2018   On back.  Dr. Ubaldo Glassing follows.   Memory change 08/16/2015   Spinal stenosis     Past Surgical History:  Procedure Laterality Date   APPENDECTOMY     COLONOSCOPY WITH ESOPHAGOGASTRODUODENOSCOPY (EGD)     COLOSTOMY     LAPAROTOMY N/A 06/27/2021   Procedure: EXPLORATORY LAPAROTOMY, SIGMOID COLECTOMY WITH COLOSTOMY;  Surgeon: Jovita Kussmaul, MD;  [facility];  Service: General;  Laterality: N/A;   RENAL ANGIOGRAPHY Right 02/05/2019    Procedure: RENAL ANGIOGRAPHY;  Surgeon: Marty Heck, MD;  Location: [facility];  Service: Cardiovascular;  Laterality: Right;     Current Outpatient Medications  Medication Sig Dispense Refill   acetaminophen (TYLENOL) 650 MG CR tablet Take 650 mg by mouth at bedtime as needed for pain.     ALPRAZolam (XANAX) 0.25 MG tablet Take 0.25 mg by mouth 2 (two) times daily as needed for anxiety.     amLODIPine-Valsartan-HCTZ 5-160-12.5 MG TABS Take 1 tablet by mouth daily.     atenolol (TENORMIN) 100 MG tablet Take 100 mg by mouth every evening.  7   Calcium-Cholecalciferol-Zinc (VIACTIV CALCIUM IMMUNE) 650-20-5.5 MG-MCG-MG CHEW Chew 1 tablet by mouth daily.     Cholecalciferol (VITAMIN D3) 50 MCG (2000 UT) capsule Take 2,000 Units by mouth daily.     docusate sodium (COLACE) 100 MG capsule Take 100 mg by mouth 2 (two) times daily.     Multiple Vitamins-Minerals (ZINC PO) Take 1 tablet by mouth daily.     nystatin-triamcinolone ointment (MYCOLOG) Apply 1 Application topically 2 (two) times daily. 30 g 1   polyethylene glycol powder (MIRALAX) 17 GM/SCOOP powder Take 17 g by mouth daily.     Propylene Glycol (SYSTANE BALANCE) 0.6 % SOLN Place 1 drop into both eyes daily as needed (dry eyes).  No current facility-administered medications for this visit.    Allergies:   Sulfa antibiotics    Social History:  The patient  reports that she has never smoked. She has never used smokeless tobacco. She reports current alcohol use. She reports that she does not use drugs.   Family History:  The patient's family history includes Dementia in her mother; Lymphoma in her brother; Stroke in her mother.    ROS:  Please see the history of present illness.   Otherwise, review of systems are positive for rare palpitations.   All other systems are reviewed and negative.    PHYSICAL EXAM: VS:  BP (!) 142/80   Pulse (!) 58   Ht '5\' 2"'$  (1.575 m)   Wt 132 lb 9.6 oz (60.1 kg)   LMP 05/30/2003    SpO2 98%   BMI 24.25 kg/m  , BMI Body mass index is 24.25 kg/m. GEN: Well nourished, well developed, in no acute distress HEENT: normal Neck: no JVD, carotid bruits, or masses Cardiac: RRR; no murmurs, rubs, or gallops,no edema  Respiratory:  clear to auscultation bilaterally, normal work of breathing GI: soft, nontender, nondistended, + BS MS: no deformity or atrophy Skin: warm and dry, no rash Neuro:  Strength and sensation are intact Psych: euthymic mood, full affect   EKG:   The ekg ordered  demonstrates sinus bradycardia, no ST segment changes   Recent Labs: 01/02/2022: ALT 13; BUN 23; Creatinine, Ser 0.82; Hemoglobin 12.6; Platelets 283; Potassium 3.9; Sodium 137   Lipid Panel No results found for: "CHOL", "TRIG", "HDL", "CHOLHDL", "VLDL", "LDLCALC", "LDLDIRECT"   Other studies Reviewed: Additional studies/ records that were reviewed today with results demonstrating: Labs reviewed.  Creatinine 0.82, HDL 81, LDL 106, triglycerides.   ASSESSMENT AND PLAN:  Preoperative cardiovascular exam: No further Cardiac testing needed before surgery.  Patient is active and has no cardiac symptoms.  She did well with surgery in January 2023 from a cardiac standpoint.  I think she is relatively low risk for surgery and her risk cannot be lowered with any cardiac intervention. HTN: Better controlled at home.  She does tend to have blood pressure spikes when she gets nervous.  Continue current medications.  Of note, she had a renal angiogram in 2020 showing no significant renal artery stenosis. Syncope at the time of her prep in July was most likely related to dehydration.  Her prep instructions will be adjusted to hopefully avoid dehydration.  Normal cardiac exam at this time.  No evidence of congestive heart failure.   Current medicines are reviewed at length with the patient today.  The patient concerns regarding her medicines were addressed.  The following changes have been made:  No  change  Labs/ tests ordered today include:  No orders of the defined types were placed in this encounter.   Recommend 150 minutes/week of aerobic exercise Low fat, low carb, high fiber diet recommended  Disposition:   FU as needed   Signed, Larae Grooms, MD  01/06/2022 8:49 AM    [facility] [address], [place], [place]  [address] Phone: [phone]; Fax: [phone]

## 2022-01-05 NOTE — Telephone Encounter (Signed)
   Pre-operative Risk Assessment    Patient Name: Carla Ramirez  DOB: 02-Apr-1951 MRN: [id number]    PT HAS A NEW PT APPT WITH DR. VARANASI ON 01/06/22  Request for Surgical Clearance    Procedure:   COLOSTOMY TAKEDOWN   Date of Surgery:  Clearance 01/12/22                                 Surgeon:  DR. Harrell Gave WHITE Surgeon's Group or Practice Name:  [facility] Phone number:  [phone] Fax number:  [phone] ATTN: Malachi Bonds, CMA   Type of Clearance Requested:   - Medical ; NO MEDICATIONS LISTED AS NEEDING TO BE HELD   Type of Anesthesia:  General    Additional requests/questions:    Jiles Prows   01/05/2022, 8:17 AM

## 2022-01-06 ENCOUNTER — Encounter: Payer: Self-pay | Admitting: Interventional Cardiology

## 2022-01-06 ENCOUNTER — Ambulatory Visit: Payer: Medicare PPO | Admitting: Interventional Cardiology

## 2022-01-06 VITALS — BP 142/80 | HR 58 | Ht 62.0 in | Wt 132.6 lb

## 2022-01-06 DIAGNOSIS — I10 Essential (primary) hypertension: Secondary | ICD-10-CM

## 2022-01-06 DIAGNOSIS — Z01810 Encounter for preprocedural cardiovascular examination: Secondary | ICD-10-CM | POA: Diagnosis not present

## 2022-01-06 NOTE — Anesthesia Preprocedure Evaluation (Signed)
Anesthesia Evaluation  Patient identified by MRN, date of birth, ID band Patient awake    Reviewed: Allergy & Precautions, NPO status , Patient's Chart, lab work & pertinent test results  Airway Mallampati: II  TM Distance: >3 FB Neck ROM: Full    Dental no notable dental hx.    Pulmonary neg pulmonary ROS,    Pulmonary exam normal breath sounds clear to auscultation       Cardiovascular hypertension, Normal cardiovascular exam Rhythm:Regular Rate:Normal     Neuro/Psych Anxiety negative neurological ROS     GI/Hepatic Neg liver ROS, GERD  ,  Endo/Other  negative endocrine ROS  Renal/GU negative Renal ROS  negative genitourinary   Musculoskeletal negative musculoskeletal ROS (+)   Abdominal   Peds negative pediatric ROS (+)  Hematology negative hematology ROS (+)   Anesthesia Other Findings   Reproductive/Obstetrics negative OB ROS                            Anesthesia Physical Anesthesia Plan  ASA: 2  Anesthesia Plan: General   Post-op Pain Management: Tylenol PO (pre-op)*   Induction: Intravenous  PONV Risk Score and Plan: 3 and Ondansetron, Dexamethasone and Treatment may vary due to age or medical condition  Airway Management Planned: Oral ETT  Additional Equipment:   Intra-op Plan:   Post-operative Plan: Extubation in OR  Informed Consent: I have reviewed the patients History and Physical, chart, labs and discussed the procedure including the risks, benefits and alternatives for the proposed anesthesia with the patient or authorized representative who has indicated his/her understanding and acceptance.     Dental advisory given  Plan Discussed with: CRNA and Surgeon  Anesthesia Plan Comments: (See APP note by Durel Salts, FNP )       Anesthesia Quick Evaluation

## 2022-01-06 NOTE — Patient Instructions (Signed)
Medication Instructions:  Your physician recommends that you continue on your current medications as directed. Please refer to the Current Medication list given to you today.  *If you need a refill on your cardiac medications before your next appointment, please call your pharmacy*   Lab Work: none If you have labs (blood work) drawn today and your tests are completely normal, you will receive your results only by: MyChart Message (if you have MyChart) OR A paper copy in the mail If you have any lab test that is abnormal or we need to change your treatment, we will call you to review the results.   Testing/Procedures: none   Follow-Up: At [facility], you and your health needs are our priority.  As part of our continuing mission to provide you with exceptional heart care, we have created designated Provider Care Teams.  These Care Teams include your primary Cardiologist (physician) and Advanced Practice Providers (APPs -  Physician Assistants and Nurse Practitioners) who all work together to provide you with the care you need, when you need it.  We recommend signing up for the patient portal called "MyChart".  Sign up information is provided on this After Visit Summary.  MyChart is used to connect with patients for Virtual Visits (Telemedicine).  Patients are able to view lab/test results, encounter notes, upcoming appointments, etc.  Non-urgent messages can be sent to your provider as well.   To learn more about what you can do with MyChart, go to [email or web address].    Your next appointment:   As needed  The format for your next appointment:   In Person  Provider:   Jayadeep Varanasi, MD     Other Instructions    Important Information About Sugar       

## 2022-01-06 NOTE — Telephone Encounter (Signed)
I will forward to pre op provider for any additional notes if needed.

## 2022-01-12 ENCOUNTER — Other Ambulatory Visit: Payer: Self-pay

## 2022-01-12 ENCOUNTER — Inpatient Hospital Stay (HOSPITAL_COMMUNITY): Payer: Medicare PPO | Admitting: Emergency Medicine

## 2022-01-12 ENCOUNTER — Inpatient Hospital Stay (HOSPITAL_COMMUNITY): Payer: Medicare PPO | Admitting: Certified Registered"

## 2022-01-12 ENCOUNTER — Inpatient Hospital Stay (HOSPITAL_COMMUNITY)
Admission: RE | Admit: 2022-01-12 | Discharge: 2022-01-16 | DRG: 330 | Disposition: A | Discharge status: Home / Self Care | Payer: Medicare PPO | Attending: Surgery | Admitting: Surgery

## 2022-01-12 ENCOUNTER — Encounter (HOSPITAL_COMMUNITY)
Admission: RE | Disposition: A | Discharge status: Home / Self Care | Payer: Self-pay | Source: Home / Self Care | Attending: Surgery

## 2022-01-12 ENCOUNTER — Encounter (HOSPITAL_COMMUNITY): Payer: Self-pay | Admitting: Surgery

## 2022-01-12 DIAGNOSIS — K5780 Diverticulitis of intestine, part unspecified, with perforation and abscess without bleeding: Secondary | ICD-10-CM

## 2022-01-12 DIAGNOSIS — E119 Type 2 diabetes mellitus without complications: Secondary | ICD-10-CM | POA: Diagnosis not present

## 2022-01-12 DIAGNOSIS — Z933 Colostomy status: Secondary | ICD-10-CM | POA: Diagnosis not present

## 2022-01-12 DIAGNOSIS — K5720 Diverticulitis of large intestine with perforation and abscess without bleeding: Secondary | ICD-10-CM | POA: Diagnosis present

## 2022-01-12 DIAGNOSIS — I10 Essential (primary) hypertension: Secondary | ICD-10-CM | POA: Diagnosis present

## 2022-01-12 DIAGNOSIS — K624 Stenosis of anus and rectum: Secondary | ICD-10-CM | POA: Diagnosis present

## 2022-01-12 DIAGNOSIS — Z9889 Other specified postprocedural states: Principal | ICD-10-CM

## 2022-01-12 DIAGNOSIS — K9400 Colostomy complication, unspecified: Secondary | ICD-10-CM | POA: Diagnosis not present

## 2022-01-12 DIAGNOSIS — Z01818 Encounter for other preprocedural examination: Secondary | ICD-10-CM

## 2022-01-12 DIAGNOSIS — K219 Gastro-esophageal reflux disease without esophagitis: Secondary | ICD-10-CM | POA: Diagnosis present

## 2022-01-12 DIAGNOSIS — Z85820 Personal history of malignant melanoma of skin: Secondary | ICD-10-CM

## 2022-01-12 DIAGNOSIS — K649 Unspecified hemorrhoids: Secondary | ICD-10-CM | POA: Diagnosis present

## 2022-01-12 DIAGNOSIS — Z823 Family history of stroke: Secondary | ICD-10-CM

## 2022-01-12 DIAGNOSIS — C189 Malignant neoplasm of colon, unspecified: Secondary | ICD-10-CM | POA: Diagnosis present

## 2022-01-12 DIAGNOSIS — Z882 Allergy status to sulfonamides status: Secondary | ICD-10-CM

## 2022-01-12 DIAGNOSIS — B359 Dermatophytosis, unspecified: Secondary | ICD-10-CM | POA: Diagnosis not present

## 2022-01-12 DIAGNOSIS — K660 Peritoneal adhesions (postprocedural) (postinfection): Secondary | ICD-10-CM | POA: Diagnosis present

## 2022-01-12 DIAGNOSIS — Z807 Family history of other malignant neoplasms of lymphoid, hematopoietic and related tissues: Secondary | ICD-10-CM | POA: Diagnosis not present

## 2022-01-12 DIAGNOSIS — K658 Other peritonitis: Secondary | ICD-10-CM | POA: Diagnosis not present

## 2022-01-12 DIAGNOSIS — R799 Abnormal finding of blood chemistry, unspecified: Secondary | ICD-10-CM

## 2022-01-12 DIAGNOSIS — Z433 Encounter for attention to colostomy: Secondary | ICD-10-CM | POA: Diagnosis not present

## 2022-01-12 DIAGNOSIS — K635 Polyp of colon: Secondary | ICD-10-CM | POA: Diagnosis not present

## 2022-01-12 DIAGNOSIS — K529 Noninfective gastroenteritis and colitis, unspecified: Secondary | ICD-10-CM | POA: Diagnosis not present

## 2022-01-12 HISTORY — PX: XI ROBOTIC ASSISTED COLOSTOMY TAKEDOWN: SHX6828

## 2022-01-12 HISTORY — PX: FLEXIBLE SIGMOIDOSCOPY: SHX5431

## 2022-01-12 HISTORY — PX: LYSIS OF ADHESION: SHX5961

## 2022-01-12 LAB — TYPE AND SCREEN
ABO/RH(D): A POS
Antibody Screen: NEGATIVE

## 2022-01-12 SURGERY — CLOSURE, COLOSTOMY, ROBOT-ASSISTED
Anesthesia: General

## 2022-01-12 MED ORDER — ALVIMOPAN 12 MG PO CAPS
12.0000 mg | ORAL_CAPSULE | ORAL | Status: AC
  Administered 2022-01-12: 12 mg via ORAL
  Filled 2022-01-12: qty 1

## 2022-01-12 MED ORDER — NEOMYCIN SULFATE 500 MG PO TABS: 1000.0000 mg | ORAL_TABLET | ORAL | Status: DC

## 2022-01-12 MED ORDER — ONDANSETRON HCL 4 MG/2ML IJ SOLN
INTRAMUSCULAR | Status: DC | PRN
  Administered 2022-01-12: 4 mg via INTRAVENOUS

## 2022-01-12 MED ORDER — ENSURE PRE-SURGERY PO LIQD
592.0000 mL | Freq: Once | ORAL | Status: DC
  Filled 2022-01-12: qty 592

## 2022-01-12 MED ORDER — ACETAMINOPHEN 500 MG PO TABS
1000.0000 mg | ORAL_TABLET | ORAL | Status: AC
  Administered 2022-01-12: 1000 mg via ORAL
  Filled 2022-01-12: qty 2

## 2022-01-12 MED ORDER — STERILE WATER FOR INJECTION IJ SOLN
INTRAMUSCULAR | Status: AC
  Filled 2022-01-12: qty 10

## 2022-01-12 MED ORDER — HYDROMORPHONE HCL 1 MG/ML IJ SOLN: 0.2500 mg | INTRAMUSCULAR | Status: DC | PRN

## 2022-01-12 MED ORDER — CHLORHEXIDINE GLUCONATE 0.12 % MT SOLN
15.0000 mL | Freq: Once | OROMUCOSAL | Status: AC
  Administered 2022-01-12: 15 mL via OROMUCOSAL

## 2022-01-12 MED ORDER — DROPERIDOL 2.5 MG/ML IJ SOLN
INTRAMUSCULAR | Status: AC
  Filled 2022-01-12: qty 2

## 2022-01-12 MED ORDER — ACETAMINOPHEN 500 MG PO TABS
1000.0000 mg | ORAL_TABLET | Freq: Four times a day (QID) | ORAL | Status: DC
  Administered 2022-01-12 – 2022-01-16 (×12): 1000 mg via ORAL
  Filled 2022-01-12 (×13): qty 2

## 2022-01-12 MED ORDER — VITAMIN D3 25 MCG (1000 UNIT) PO TABS
2000.0000 [IU] | ORAL_TABLET | Freq: Every day | ORAL | Status: DC
  Administered 2022-01-13 – 2022-01-16 (×4): 2000 [IU] via ORAL
  Filled 2022-01-12 (×4): qty 2

## 2022-01-12 MED ORDER — LIDOCAINE HCL (PF) 2 % IJ SOLN
INTRAMUSCULAR | Status: DC | PRN
  Administered 2022-01-12: 1 mg/kg/h via INTRADERMAL

## 2022-01-12 MED ORDER — ROCURONIUM BROMIDE 10 MG/ML (PF) SYRINGE
PREFILLED_SYRINGE | INTRAVENOUS | Status: DC | PRN
  Administered 2022-01-12: 60 mg via INTRAVENOUS
  Administered 2022-01-12: 20 mg via INTRAVENOUS

## 2022-01-12 MED ORDER — PROPOFOL 10 MG/ML IV BOLUS
INTRAVENOUS | Status: DC | PRN
  Administered 2022-01-12: 100 mg via INTRAVENOUS

## 2022-01-12 MED ORDER — STERILE WATER FOR INJECTION IJ SOLN
INTRAMUSCULAR | Status: DC | PRN
  Administered 2022-01-12: 15 mL

## 2022-01-12 MED ORDER — LACTATED RINGERS IV SOLN: INTRAVENOUS | Status: DC

## 2022-01-12 MED ORDER — CHLORHEXIDINE GLUCONATE CLOTH 2 % EX PADS: 6.0000 | MEDICATED_PAD | Freq: Once | CUTANEOUS | Status: DC

## 2022-01-12 MED ORDER — EPHEDRINE 5 MG/ML INJ
INTRAVENOUS | Status: AC
  Filled 2022-01-12: qty 5

## 2022-01-12 MED ORDER — KETAMINE HCL 10 MG/ML IJ SOLN
INTRAMUSCULAR | Status: DC | PRN
  Administered 2022-01-12: 10 mg via INTRAVENOUS

## 2022-01-12 MED ORDER — CALCIUM-CHOLECALCIFEROL-ZINC 650-20-5.5 MG-MCG-MG PO CHEW: 1.0000 | CHEWABLE_TABLET | Freq: Every day | ORAL | Status: DC

## 2022-01-12 MED ORDER — BUPIVACAINE-EPINEPHRINE 0.5% -1:200000 IJ SOLN
INTRAMUSCULAR | Status: DC | PRN
  Administered 2022-01-12: 30 mL

## 2022-01-12 MED ORDER — TRAMADOL HCL 50 MG PO TABS: 50.0000 mg | ORAL_TABLET | Freq: Four times a day (QID) | ORAL | Status: DC | PRN

## 2022-01-12 MED ORDER — HYDRALAZINE HCL 20 MG/ML IJ SOLN: 10.0000 mg | INTRAMUSCULAR | Status: DC | PRN

## 2022-01-12 MED ORDER — ALPRAZOLAM 0.25 MG PO TABS
0.2500 mg | ORAL_TABLET | Freq: Two times a day (BID) | ORAL | Status: DC | PRN
Start: 2022-01-12 — End: 2022-01-16

## 2022-01-12 MED ORDER — IBUPROFEN 400 MG PO TABS: 600.0000 mg | ORAL_TABLET | Freq: Four times a day (QID) | ORAL | Status: DC | PRN

## 2022-01-12 MED ORDER — LIDOCAINE HCL 2 % IJ SOLN
INTRAMUSCULAR | Status: AC
  Filled 2022-01-12: qty 20

## 2022-01-12 MED ORDER — 0.9 % SODIUM CHLORIDE (POUR BTL) OPTIME
TOPICAL | Status: DC | PRN
  Administered 2022-01-12: 2000 mL

## 2022-01-12 MED ORDER — ORAL CARE MOUTH RINSE: 15.0000 mL | Freq: Once | OROMUCOSAL | Status: AC

## 2022-01-12 MED ORDER — PROPYLENE GLYCOL 0.6 % OP SOLN: 1.0000 [drp] | Freq: Every day | OPHTHALMIC | Status: DC | PRN

## 2022-01-12 MED ORDER — DIPHENHYDRAMINE HCL 50 MG/ML IJ SOLN: 12.5000 mg | Freq: Four times a day (QID) | INTRAMUSCULAR | Status: DC | PRN

## 2022-01-12 MED ORDER — PHENYLEPHRINE HCL-NACL 20-0.9 MG/250ML-% IV SOLN
INTRAVENOUS | Status: DC | PRN
  Administered 2022-01-12: 25 ug/min via INTRAVENOUS

## 2022-01-12 MED ORDER — OYSTER SHELL CALCIUM/D3 500-5 MG-MCG PO TABS
1.0000 | ORAL_TABLET | Freq: Every day | ORAL | Status: DC
  Administered 2022-01-13 – 2022-01-16 (×5): 1 via ORAL
  Filled 2022-01-12 (×6): qty 1

## 2022-01-12 MED ORDER — SUGAMMADEX SODIUM 200 MG/2ML IV SOLN
INTRAVENOUS | Status: DC | PRN
  Administered 2022-01-12: 200 mg via INTRAVENOUS

## 2022-01-12 MED ORDER — FENTANYL CITRATE (PF) 100 MCG/2ML IJ SOLN
INTRAMUSCULAR | Status: AC
  Filled 2022-01-12: qty 2

## 2022-01-12 MED ORDER — SPY AGENT GREEN - (INDOCYANINE FOR INJECTION)
INTRAMUSCULAR | Status: DC | PRN
  Administered 2022-01-12: 4 mL via INTRAVENOUS

## 2022-01-12 MED ORDER — LIDOCAINE 2% (20 MG/ML) 5 ML SYRINGE
INTRAMUSCULAR | Status: DC | PRN
  Administered 2022-01-12: 100 mg via INTRAVENOUS

## 2022-01-12 MED ORDER — MIDAZOLAM HCL 2 MG/2ML IJ SOLN
INTRAMUSCULAR | Status: AC
  Filled 2022-01-12: qty 2

## 2022-01-12 MED ORDER — DROPERIDOL 2.5 MG/ML IJ SOLN
INTRAMUSCULAR | Status: DC | PRN
  Administered 2022-01-12: .625 mg via INTRAVENOUS

## 2022-01-12 MED ORDER — HYDROCHLOROTHIAZIDE 12.5 MG PO TABS
12.5000 mg | ORAL_TABLET | Freq: Every day | ORAL | Status: DC
  Administered 2022-01-13 – 2022-01-16 (×4): 12.5 mg via ORAL
  Filled 2022-01-12 (×4): qty 1

## 2022-01-12 MED ORDER — LIDOCAINE 2% (20 MG/ML) 5 ML SYRINGE
INTRAMUSCULAR | Status: AC
  Filled 2022-01-12: qty 5

## 2022-01-12 MED ORDER — EPHEDRINE SULFATE-NACL 50-0.9 MG/10ML-% IV SOSY
PREFILLED_SYRINGE | INTRAVENOUS | Status: DC | PRN
  Administered 2022-01-12 (×2): 10 mg via INTRAVENOUS

## 2022-01-12 MED ORDER — HEPARIN SODIUM (PORCINE) 5000 UNIT/ML IJ SOLN
5000.0000 [IU] | Freq: Three times a day (TID) | INTRAMUSCULAR | Status: DC
  Administered 2022-01-12 – 2022-01-16 (×11): 5000 [IU] via SUBCUTANEOUS
  Filled 2022-01-12 (×11): qty 1

## 2022-01-12 MED ORDER — PHENYLEPHRINE 80 MCG/ML (10ML) SYRINGE FOR IV PUSH (FOR BLOOD PRESSURE SUPPORT)
PREFILLED_SYRINGE | INTRAVENOUS | Status: DC | PRN
  Administered 2022-01-12: 80 ug via INTRAVENOUS

## 2022-01-12 MED ORDER — SODIUM CHLORIDE 0.9 % IR SOLN
Status: DC | PRN
  Administered 2022-01-12: 1000 mL

## 2022-01-12 MED ORDER — SIMETHICONE 80 MG PO CHEW
40.0000 mg | CHEWABLE_TABLET | Freq: Four times a day (QID) | ORAL | Status: DC | PRN
  Administered 2022-01-14 – 2022-01-16 (×4): 40 mg via ORAL
  Filled 2022-01-12 (×4): qty 1

## 2022-01-12 MED ORDER — OXYCODONE HCL 5 MG/5ML PO SOLN: 5.0000 mg | Freq: Once | ORAL | Status: DC | PRN

## 2022-01-12 MED ORDER — ONDANSETRON HCL 4 MG/2ML IJ SOLN: 4.0000 mg | Freq: Four times a day (QID) | INTRAMUSCULAR | Status: DC | PRN

## 2022-01-12 MED ORDER — DEXAMETHASONE SODIUM PHOSPHATE 10 MG/ML IJ SOLN
INTRAMUSCULAR | Status: AC
  Filled 2022-01-12: qty 1

## 2022-01-12 MED ORDER — ONDANSETRON HCL 4 MG PO TABS: 4.0000 mg | ORAL_TABLET | Freq: Four times a day (QID) | ORAL | Status: DC | PRN

## 2022-01-12 MED ORDER — ONDANSETRON HCL 4 MG/2ML IJ SOLN
INTRAMUSCULAR | Status: AC
  Filled 2022-01-12: qty 2

## 2022-01-12 MED ORDER — PHENYLEPHRINE HCL (PRESSORS) 10 MG/ML IV SOLN
INTRAVENOUS | Status: AC
  Filled 2022-01-12: qty 1

## 2022-01-12 MED ORDER — POLYVINYL ALCOHOL 1.4 % OP SOLN: 1.0000 [drp] | OPHTHALMIC | Status: DC | PRN

## 2022-01-12 MED ORDER — KETAMINE HCL 50 MG/5ML IJ SOSY
PREFILLED_SYRINGE | INTRAMUSCULAR | Status: AC
  Filled 2022-01-12: qty 5

## 2022-01-12 MED ORDER — ENSURE SURGERY PO LIQD
237.0000 mL | Freq: Two times a day (BID) | ORAL | Status: DC
  Administered 2022-01-13 – 2022-01-16 (×7): 237 mL via ORAL

## 2022-01-12 MED ORDER — ALVIMOPAN 12 MG PO CAPS
12.0000 mg | ORAL_CAPSULE | Freq: Two times a day (BID) | ORAL | Status: DC
  Administered 2022-01-13 – 2022-01-15 (×6): 12 mg via ORAL
  Filled 2022-01-12 (×6): qty 1

## 2022-01-12 MED ORDER — AMLODIPINE BESYLATE 5 MG PO TABS
5.0000 mg | ORAL_TABLET | Freq: Every day | ORAL | Status: DC
  Administered 2022-01-13 – 2022-01-16 (×4): 5 mg via ORAL
  Filled 2022-01-12 (×4): qty 1

## 2022-01-12 MED ORDER — PROPOFOL 10 MG/ML IV BOLUS
INTRAVENOUS | Status: AC
  Filled 2022-01-12: qty 20

## 2022-01-12 MED ORDER — DIPHENHYDRAMINE HCL 12.5 MG/5ML PO ELIX: 12.5000 mg | ORAL_SOLUTION | Freq: Four times a day (QID) | ORAL | Status: DC | PRN

## 2022-01-12 MED ORDER — BUPIVACAINE-EPINEPHRINE 0.5% -1:200000 IJ SOLN
INTRAMUSCULAR | Status: AC
  Filled 2022-01-12: qty 1

## 2022-01-12 MED ORDER — FENTANYL CITRATE (PF) 100 MCG/2ML IJ SOLN
INTRAMUSCULAR | Status: DC | PRN
Start: 2022-01-12 — End: 2022-01-12
  Administered 2022-01-12 (×2): 50 ug via INTRAVENOUS

## 2022-01-12 MED ORDER — HEPARIN SODIUM (PORCINE) 5000 UNIT/ML IJ SOLN
5000.0000 [IU] | Freq: Once | INTRAMUSCULAR | Status: AC
  Administered 2022-01-12: 5000 [IU] via SUBCUTANEOUS
  Filled 2022-01-12: qty 1

## 2022-01-12 MED ORDER — ALUM & MAG HYDROXIDE-SIMETH 200-200-20 MG/5ML PO SUSP: 30.0000 mL | Freq: Four times a day (QID) | ORAL | Status: DC | PRN

## 2022-01-12 MED ORDER — HYDROMORPHONE HCL 1 MG/ML IJ SOLN: 0.5000 mg | INTRAMUSCULAR | Status: DC | PRN

## 2022-01-12 MED ORDER — POLYETHYLENE GLYCOL 3350 17 GM/SCOOP PO POWD: 1.0000 | Freq: Once | ORAL | Status: DC

## 2022-01-12 MED ORDER — SODIUM CHLORIDE 0.9 % IV SOLN
2.0000 g | INTRAVENOUS | Status: AC
  Administered 2022-01-12: 2 g via INTRAVENOUS
  Filled 2022-01-12: qty 2

## 2022-01-12 MED ORDER — DEXAMETHASONE SODIUM PHOSPHATE 10 MG/ML IJ SOLN
INTRAMUSCULAR | Status: DC | PRN
  Administered 2022-01-12: 10 mg via INTRAVENOUS

## 2022-01-12 MED ORDER — IRBESARTAN 150 MG PO TABS
150.0000 mg | ORAL_TABLET | Freq: Every day | ORAL | Status: DC
  Administered 2022-01-13 – 2022-01-16 (×4): 150 mg via ORAL
  Filled 2022-01-12 (×4): qty 1

## 2022-01-12 MED ORDER — AMLODIPINE-VALSARTAN-HCTZ 5-160-12.5 MG PO TABS
1.0000 | ORAL_TABLET | Freq: Every day | ORAL | Status: DC
Start: 2022-01-13 — End: 2022-01-12

## 2022-01-12 MED ORDER — ROCURONIUM BROMIDE 10 MG/ML (PF) SYRINGE
PREFILLED_SYRINGE | INTRAVENOUS | Status: AC
  Filled 2022-01-12: qty 10

## 2022-01-12 MED ORDER — MIDAZOLAM HCL 5 MG/5ML IJ SOLN
INTRAMUSCULAR | Status: DC | PRN
  Administered 2022-01-12: 2 mg via INTRAVENOUS

## 2022-01-12 MED ORDER — STERILE WATER FOR IRRIGATION IR SOLN
Status: DC | PRN
  Administered 2022-01-12: 500 mL

## 2022-01-12 MED ORDER — ENSURE PRE-SURGERY PO LIQD
296.0000 mL | Freq: Once | ORAL | Status: DC
  Filled 2022-01-12: qty 296

## 2022-01-12 MED ORDER — BUPIVACAINE LIPOSOME 1.3 % IJ SUSP: 20.0000 mL | Freq: Once | INTRAMUSCULAR | Status: DC

## 2022-01-12 MED ORDER — LACTATED RINGERS IR SOLN
Status: DC | PRN
  Administered 2022-01-12: 1000 mL

## 2022-01-12 MED ORDER — ONDANSETRON HCL 4 MG/2ML IJ SOLN: 4.0000 mg | Freq: Once | INTRAMUSCULAR | Status: DC | PRN

## 2022-01-12 MED ORDER — BUPIVACAINE LIPOSOME 1.3 % IJ SUSP
INTRAMUSCULAR | Status: AC
  Filled 2022-01-12: qty 20

## 2022-01-12 MED ORDER — OXYCODONE HCL 5 MG PO TABS: 5.0000 mg | ORAL_TABLET | Freq: Once | ORAL | Status: DC | PRN

## 2022-01-12 MED ORDER — METRONIDAZOLE 500 MG PO TABS: 1000.0000 mg | ORAL_TABLET | ORAL | Status: DC

## 2022-01-12 MED ORDER — BUPIVACAINE LIPOSOME 1.3 % IJ SUSP
INTRAMUSCULAR | Status: DC | PRN
  Administered 2022-01-12: 20 mL

## 2022-01-12 MED ORDER — BISACODYL 5 MG PO TBEC: 20.0000 mg | DELAYED_RELEASE_TABLET | Freq: Once | ORAL | Status: DC

## 2022-01-12 SURGICAL SUPPLY — 143 items
ADAPTER GOLDBERG URETERAL (ADAPTER) IMPLANT
ADH SKN CLS APL DERMABOND .7 (GAUZE/BANDAGES/DRESSINGS) ×2
ADPR CATH 15X14FR FL DRN BG (ADAPTER)
APL PRP STRL LF DISP 70% ISPRP (MISCELLANEOUS) ×1
APPLIER CLIP 5 13 M/L LIGAMAX5 (MISCELLANEOUS)
APPLIER CLIP ROT 10 11.4 M/L (STAPLE)
BAG COUNTER SPONGE SURGICOUNT (BAG) IMPLANT
BAG SPNG CNTER NS LX DISP (BAG)
BAG URO CATCHER STRL LF (MISCELLANEOUS) ×2 IMPLANT
BLADE EXTENDED COATED 6.5IN (ELECTRODE) ×2 IMPLANT
CANNULA REDUC XI 12-8 STAPL (CANNULA) ×2
CANNULA REDUCER 12-8 DVNC XI (CANNULA) ×2 IMPLANT
CATH URETL OPEN 5X70 (CATHETERS) IMPLANT
CELLS DAT CNTRL 66122 CELL SVR (MISCELLANEOUS) IMPLANT
CHLORAPREP W/TINT 26 (MISCELLANEOUS) ×2 IMPLANT
CLIP APPLIE 5 13 M/L LIGAMAX5 (MISCELLANEOUS) IMPLANT
CLIP APPLIE ROT 10 11.4 M/L (STAPLE) IMPLANT
CLIP LIGATING HEM O LOK PURPLE (MISCELLANEOUS) IMPLANT
CLIP LIGATING HEMO O LOK GREEN (MISCELLANEOUS) IMPLANT
CLOTH BEACON ORANGE TIMEOUT ST (SAFETY) ×2 IMPLANT
COVER SURGICAL LIGHT HANDLE (MISCELLANEOUS) ×4 IMPLANT
COVER TIP SHEARS 8 DVNC (MISCELLANEOUS) ×2 IMPLANT
COVER TIP SHEARS 8MM DA VINCI (MISCELLANEOUS) ×2
DEFOGGER SCOPE WARMER CLEARIFY (MISCELLANEOUS) ×2 IMPLANT
DERMABOND ADVANCED (GAUZE/BANDAGES/DRESSINGS) ×2
DERMABOND ADVANCED .7 DNX12 (GAUZE/BANDAGES/DRESSINGS) IMPLANT
DEVICE TROCAR PUNCTURE CLOSURE (ENDOMECHANICALS) IMPLANT
DRAIN CHANNEL 19F RND (DRAIN) ×2 IMPLANT
DRAPE ARM DVNC X/XI (DISPOSABLE) ×8 IMPLANT
DRAPE COLUMN DVNC XI (DISPOSABLE) ×2 IMPLANT
DRAPE DA VINCI XI ARM (DISPOSABLE) ×8
DRAPE DA VINCI XI COLUMN (DISPOSABLE) ×2
DRAPE SURG IRRIG POUCH 19X23 (DRAPES) ×2 IMPLANT
DRSG OPSITE POSTOP 4X10 (GAUZE/BANDAGES/DRESSINGS) IMPLANT
DRSG OPSITE POSTOP 4X6 (GAUZE/BANDAGES/DRESSINGS) IMPLANT
DRSG OPSITE POSTOP 4X8 (GAUZE/BANDAGES/DRESSINGS) IMPLANT
DRSG TEGADERM 2-3/8X2-3/4 SM (GAUZE/BANDAGES/DRESSINGS) ×10 IMPLANT
DRSG TEGADERM 4X4.75 (GAUZE/BANDAGES/DRESSINGS) ×2 IMPLANT
ELECT REM PT RETURN 15FT ADLT (MISCELLANEOUS) ×2 IMPLANT
ENDOLOOP SUT PDS II  0 18 (SUTURE)
ENDOLOOP SUT PDS II 0 18 (SUTURE) IMPLANT
EVACUATOR SILICONE 100CC (DRAIN) ×2 IMPLANT
GAUZE SPONGE 2X2 8PLY STRL LF (GAUZE/BANDAGES/DRESSINGS) ×2 IMPLANT
GAUZE SPONGE 4X4 12PLY STRL (GAUZE/BANDAGES/DRESSINGS) IMPLANT
GLOVE BIO SURGEON STRL SZ7.5 (GLOVE) ×6 IMPLANT
GLOVE INDICATOR 8.0 STRL GRN (GLOVE) ×6 IMPLANT
GLOVE SURG LX 7.5 STRW (GLOVE) ×2
GLOVE SURG LX STRL 7.5 STRW (GLOVE) ×2 IMPLANT
GOWN SRG XL LVL 4 BRTHBL STRL (GOWNS) ×2 IMPLANT
GOWN STRL NON-REIN XL LVL4 (GOWNS) ×2
GOWN STRL REUS W/ TWL LRG LVL3 (GOWN DISPOSABLE) ×2 IMPLANT
GOWN STRL REUS W/ TWL XL LVL3 (GOWN DISPOSABLE) ×10 IMPLANT
GOWN STRL REUS W/TWL LRG LVL3 (GOWN DISPOSABLE) ×2
GOWN STRL REUS W/TWL XL LVL3 (GOWN DISPOSABLE) ×10
GRASPER SUT TROCAR 14GX15 (MISCELLANEOUS) IMPLANT
GUIDEWIRE ANG ZIPWIRE 038X150 (WIRE) IMPLANT
GUIDEWIRE STR DUAL SENSOR (WIRE) IMPLANT
HOLDER FOLEY CATH W/STRAP (MISCELLANEOUS) ×2 IMPLANT
IRRIG SUCT STRYKERFLOW 2 WTIP (MISCELLANEOUS) ×2
IRRIGATION SUCT STRKRFLW 2 WTP (MISCELLANEOUS) ×2 IMPLANT
KIT PROCEDURE DA VINCI SI (MISCELLANEOUS) ×2
KIT PROCEDURE DVNC SI (MISCELLANEOUS) ×2 IMPLANT
KIT TURNOVER KIT A (KITS) IMPLANT
MANIFOLD NEPTUNE II (INSTRUMENTS) ×2 IMPLANT
NDL INSUFFLATION 14GA 120MM (NEEDLE) ×2 IMPLANT
NEEDLE INSUFFLATION 14GA 120MM (NEEDLE) ×2 IMPLANT
PACK CARDIOVASCULAR III (CUSTOM PROCEDURE TRAY) ×2 IMPLANT
PACK COLON (CUSTOM PROCEDURE TRAY) ×2 IMPLANT
PACK CYSTO (CUSTOM PROCEDURE TRAY) ×2 IMPLANT
PAD POSITIONING PINK XL (MISCELLANEOUS) ×2 IMPLANT
PENCIL SMOKE EVACUATOR (MISCELLANEOUS) IMPLANT
PROTECTOR NERVE ULNAR (MISCELLANEOUS) ×4 IMPLANT
RELOAD STAPLE 45 3.5 BLU DVNC (STAPLE) IMPLANT
RELOAD STAPLE 45 4.3 GRN DVNC (STAPLE) IMPLANT
RELOAD STAPLE 60 3.5 BLU DVNC (STAPLE) IMPLANT
RELOAD STAPLE 60 3.6 BLU REG (STAPLE) IMPLANT
RELOAD STAPLE 60 4.1 GRN THCK (STAPLE) IMPLANT
RELOAD STAPLE 60 4.3 GRN DVNC (STAPLE) IMPLANT
RELOAD STAPLER 3.5X45 BLU DVNC (STAPLE) IMPLANT
RELOAD STAPLER 3.5X60 BLU DVNC (STAPLE) IMPLANT
RELOAD STAPLER 4.3X45 GRN DVNC (STAPLE) IMPLANT
RELOAD STAPLER 4.3X60 GRN DVNC (STAPLE) IMPLANT
RELOAD STAPLER BLUE 60MM (STAPLE) ×2 IMPLANT
RELOAD STAPLER GREEN 60MM (STAPLE) ×2 IMPLANT
RETRACTOR WND ALEXIS 18 MED (MISCELLANEOUS) IMPLANT
RTRCTR WOUND ALEXIS 18CM MED (MISCELLANEOUS)
SCISSORS LAP 5X35 DISP (ENDOMECHANICALS) IMPLANT
SEAL CANN UNIV 5-8 DVNC XI (MISCELLANEOUS) ×8 IMPLANT
SEAL XI 5MM-8MM UNIVERSAL (MISCELLANEOUS) ×8
SEALER VESSEL DA VINCI XI (MISCELLANEOUS) ×2
SEALER VESSEL EXT DVNC XI (MISCELLANEOUS) ×2 IMPLANT
SLEEVE ADV FIXATION 5X100MM (TROCAR) IMPLANT
SOLUTION ELECTROLUBE (MISCELLANEOUS) ×2 IMPLANT
SPIKE FLUID TRANSFER (MISCELLANEOUS) ×2 IMPLANT
SPONGE GAUZE 2X2 STER 10/PKG (GAUZE/BANDAGES/DRESSINGS) ×2
STAPLER CANNULA SEAL DVNC XI (STAPLE) ×2 IMPLANT
STAPLER CANNULA SEAL XI (STAPLE) ×2
STAPLER ECHELON LONG 60 440 (INSTRUMENTS) IMPLANT
STAPLER ECHELON POWER CIR 29 (STAPLE) IMPLANT
STAPLER ECHELON POWER CIR 31 (STAPLE) IMPLANT
STAPLER RELOAD 3.5X45 BLU DVNC (STAPLE)
STAPLER RELOAD 3.5X45 BLUE (STAPLE)
STAPLER RELOAD 3.5X60 BLU DVNC (STAPLE)
STAPLER RELOAD 3.5X60 BLUE (STAPLE)
STAPLER RELOAD 4.3X45 GREEN (STAPLE)
STAPLER RELOAD 4.3X45 GRN DVNC (STAPLE)
STAPLER RELOAD 4.3X60 GREEN (STAPLE)
STAPLER RELOAD 4.3X60 GRN DVNC (STAPLE)
STAPLER RELOAD BLUE 60MM (STAPLE) ×2
STAPLER RELOAD GREEN 60MM (STAPLE) ×2
STOPCOCK 4 WAY LG BORE MALE ST (IV SETS) ×4 IMPLANT
SURGILUBE 2OZ TUBE FLIPTOP (MISCELLANEOUS) ×2 IMPLANT
SUT MNCRL AB 4-0 PS2 18 (SUTURE) ×2 IMPLANT
SUT PDS AB 1 CT1 27 (SUTURE) IMPLANT
SUT PDS AB 1 TP1 96 (SUTURE) IMPLANT
SUT PROLENE 0 CT 2 (SUTURE) IMPLANT
SUT PROLENE 2 0 KS (SUTURE) ×2 IMPLANT
SUT PROLENE 2 0 SH DA (SUTURE) IMPLANT
SUT SILK 2 0 (SUTURE)
SUT SILK 2 0 SH CR/8 (SUTURE) IMPLANT
SUT SILK 2-0 18XBRD TIE 12 (SUTURE) IMPLANT
SUT SILK 3 0 (SUTURE) ×2
SUT SILK 3 0 SH CR/8 (SUTURE) ×2 IMPLANT
SUT SILK 3-0 18XBRD TIE 12 (SUTURE) ×2 IMPLANT
SUT V-LOC BARB 180 2/0GR6 GS22 (SUTURE)
SUT VIC AB 3-0 SH 18 (SUTURE) IMPLANT
SUT VIC AB 3-0 SH 27 (SUTURE)
SUT VIC AB 3-0 SH 27XBRD (SUTURE) IMPLANT
SUT VICRYL 0 UR6 27IN ABS (SUTURE) ×2 IMPLANT
SUTURE V-LC BRB 180 2/0GR6GS22 (SUTURE) IMPLANT
SYR 10ML LL (SYRINGE) ×2 IMPLANT
SYS LAPSCP GELPORT 120MM (MISCELLANEOUS)
SYS WOUND ALEXIS 18CM MED (MISCELLANEOUS) ×2
SYSTEM LAPSCP GELPORT 120MM (MISCELLANEOUS) IMPLANT
SYSTEM WOUND ALEXIS 18CM MED (MISCELLANEOUS) ×2 IMPLANT
TAPE UMBILICAL 1/8 X36 TWILL (MISCELLANEOUS) ×2 IMPLANT
TOWEL OR NON WOVEN STRL DISP B (DISPOSABLE) ×2 IMPLANT
TRAY FOLEY MTR SLVR 16FR STAT (SET/KITS/TRAYS/PACK) ×2 IMPLANT
TRAY FOLEY W/BAG SLVR 14FR LF (SET/KITS/TRAYS/PACK) IMPLANT
TROCAR ADV FIXATION 5X100MM (TROCAR) ×2 IMPLANT
TUBING CONNECTING 10 (TUBING) ×6 IMPLANT
TUBING INSUFFLATION 10FT LAP (TUBING) ×2 IMPLANT
TUBING UROLOGY SET (TUBING) IMPLANT

## 2022-01-12 NOTE — Op Note (Signed)
Preoperative diagnosis: Colon cancer  Postoperative diagnosis: Colon cancer  Procedure: Cystoscopy with intraureteral injection of firefly  Surgeon: Pryor Curia MD  Anesthesia: General  Complications: None  Indication: Ms. Carla Ramirez is a 71 year old female who is scheduled to undergo colon resection by Dr. Nadeen Landau.  He did request ureteral injection of firefly for ureteral identification intraoperatively during his procedure.  The risks, potential benefits, and expected recovery process were discussed in detail.  Informed consent was obtained.  Description of procedure: The patient was taken the operating room and general anesthetic was administered.  She was given preoperative antibiotics, placed in the dorsolithotomy position, and prepped and draped in usual sterile fashion.  Next, a preoperative timeout was performed.  Cystourethroscopy was then performed.  This revealed a normal urethra.  The bladder was examined systematically and there were no mucosal abnormalities noted.  The ureteral orifice ease were in their expected anatomic location.  A 6 French ureteral catheter was then used to intubate the right ureteral orifice.  5 cc of indocyanine green dye was injected.  The ureteral catheter was then inserted into the left ureteral orifice and an additional 5 cc of indocyanine green dye was injected.  A 16 French Foley catheter was then placed.  The patient tolerated this portion the procedure well without complications.  The procedure was then turned over to Dr. Dema Severin for his portion.

## 2022-01-12 NOTE — Op Note (Signed)
PATIENT: Carla Ramirez  71 y.o. 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 Iria Jamerson, MD  ASSISTANT: Leighton Ruff, MD  ANESTHESIA: General endotracheal  EBL: 15 mL Total I/O In: 1100 [I.V.:1000; IV Piggyback:100] Out: 215 [Urine:200; Blood:15]  DRAINS: None  SPECIMEN:  Remnant sigmoid colon - blue prolene stitch proximal Rectosigmoid colon Colostomy  COUNTS: Sponge, needle and instrument counts were reported correct x2  FINDINGS: Omental containing adhesions across her midline from prior surgery.  Began with resecting the remnant sigmoid taking it at the level of the rectosigmoid junction.  There was however a stricture near her proximalmost rectum.  Flexible sigmoidoscopy confirmed this was at the level of the proximalmost valve of [place].  We were not able to pass a 29 mm EEA sizer due to this intrinsic narrowing.  There is no associated mass.  We therefore opted however to go down just below this strictured segment carrying out a low anterior resection in order to have a nice healthy rectal stump to hook her colon up to.  A well perfused, tension free, hemostatic, airtight 29 mm EEA colorectal anastomosis was fashioned at 8 cm from the  anal verge by flexible sigmoidoscopy.  This is above the level of the peritoneal reflection.    NARRATIVE: Informed consent was verified. The patient was taken to the operating room, placed supine on the operating table and SCD's were applied. General endotracheal anesthesia was induced without difficulty. She was then positioned in the lithotomy position with Allen stirrups.  Pressure points were evaluated and padded. Hair on the abdomen was clipped.  She was secured to the operating table. Dr. Alinda Money with [facility] scrubbed for his portion of the procedure-cystoscopy/ureteral firefly.  Please refer to his notes for details.. The abdomen was then prepped and draped in the standard sterile fashion. Surgical timeout was called indicating the correct patient, procedure, positioning and need for preoperative antibiotics.   An OG tube was placed by anesthesia and confirmed to be to suction.  At Palmer's point, a stab incision was created and the Veress needle was introduced into the peritoneal cavity on the first attempt.  Intraperitoneal location was confirmed by the aspiration and saline drop test.  Pneumoperitoneum was established to a maximum pressure of 15 mmHg using CO2.  Following this, the abdomen was marked for planned trocar sites.  Just to the right and cephalad to the umbilicus, an 8 mm incision was created and an 8 mm blunt tipped robotic trocar was cautiously placed into the peritoneal cavity.  The laparoscope was inserted and demonstrated no evidence of trocar site nor Veress needle site complications.  The Veress needle was removed.  Bilateral transversus abdominis plane blocks were then created using a dilute mixture of Exparel with Marcaine.  3 additional 8 mm robotic trochars were placed under direct visualization roughly in a line extending from the right ASIS towards the left upper  quadrant. An additional 5 mm assist port was placed in the right lateral abdomen under direct  visualization.  The abdomen was surveyed and there was omental containing adhesions across the portion of midline where she had her prior laparotomy.  The colon is otherwise normal.  The sigmoidal stump is identified with Prolene sutures in the pelvis.  There are no significant small bowel adhesions within her pelvis.  She was positioned in Trendelenburg with the left side tilted slightly up.  Small bowel was carefully retracted out of the pelvis.  The robot was then docked and I went to the console.  We began with adhesiolysis. Adhesions consisting of omentum were taken down from the midline.  Omental adhesions were also taken off of her native descending colon going out to her colostomy.  We began by mobilizing her colostomy/descending colon by incising the Jatavia Keltner line of Toldt all the way up to the splenic flexure.  The associated mesocolon was reflected medially.  She has an obvious well perfused segment of mesentery going out to her colostomy with a visible left colic artery essentially running to the level of her colostomy.  There is a visible pulse in this vessel.  We then confirmed that we had more than adequate mobilization for this to reach into the deep pelvis.   Attention is then directed at the pelvis.  The remnant sigmoid is identified with the blue Prolene sutures.  This is grasped and elevated.  The associated distal sigmoid is mobilized.  We were able to visualize her left and right ureter using firefly visualization.  We are able to stay medial to the structures.  We are also able to free this from her left adnexa.  The colon is healthy in appearance.  There is some diverticulum's associated with this remnant sigmoid.  We began by making a window in the associated mesocolon out to the level of the rectosigmoid colon where the tinea have splayed and there were loss of appendices epiploica.  This corresponds with the proximal most aspect of the rectum.  The associated mesentery is then cleared  using the vessel sealer.  The robot is then undocked.  I scrubbed back in. Attention is then directed taking down the colostomy.  An incision is created around the colostomy.  The associated subcutaneous fat is dissected electrocautery.  We are able to readily gain access to the peritoneal space.  The colostomy was freed circumferentially from its attachments to the rectus fascia.  Going just proximal to her colostomy, a pursestring device is applied.  A 2-0 Prolene on a Keith needle was passed.  The colostomy was then removed sharply and passed off the specimen.  Belt loop sutures consisting of 3-0 silk were then placed around the pursestring suture line.  EEA sizers were passed and a 20 mm EEA is selected.  The anvil was then placed and the pursestring is tied.  A small amount of fat is cleared from the planned anastomotic staple line.  There are no diverticula evident within this either.  This is then placed back into the abdomen.  An Alexis wound protector with a yellow cap is then placed.  A 12 mm port is placed through this.  Pneumoperitoneum was reestablished.  Attention is directed to dividing the rectosigmoid stump using a laparoscopic blue load Echelon powered stapler.  This is done with a single firing.   I then went below to pass the EEA sizers.  A digital rectal exam was performed and there is no  palpable masses.  Some waxy remnant stool was removed.  Under direct visualization, a 25 mm EEA sizer was carefully advanced into the rectal stump.  There is a relatively dense stricture at the proximal aspect of her rectum below our staple line.  We are not able to traverse this with our 25 mm sizer.  I elected to therefore perform a sigmoidoscopy for further evaluation.  The quality of her tissues is healthy and pink in appearance.  There is no evident mucosal abnormalities aside from her having a relatively narrowed proximal rectum with some angulation at the proximalmost valve of [place].  It was  clear that we are not going to be able to pass a 29 mm EEA stapler nor 25 mm for that matter.  We therefore opted to proceed with a low anterior resection to ensure we had a healthy rectal stump to hook up to.  I scrubbed back in.  The robot was redocked.  I went to the console.  A vessel sealer was then utilized to further mobilize her proximal rectum out of her pelvis.  The associate mesorectum was then cleared circumferentially using the vessel sealing device.  This is at the level of her proximal-mid rectum junction and above her peritoneal reflection.  Hemostasis is then verified in the pelvis.  ICG was then administered by anesthesia and using near infrared visualization, there is excellent perfusion out to the level Creighton angle on our proximal staple line.  There is also avid perfusion of her rectal stump out of the location where it has been cleared.  I scrubbed back in.  A green load 60 mm Echelon powered stapler was then utilized to divide the rectum at the location where he had cleared the mesentery.  Both the remnant sigmoid and the rectosigmoid are passed off of the specimens.  I went back to the console.  My partner then went below to pass the EEA sizers.  The 29 mm sizer passed out any difficulty to the level of our staple line.  The EEA stapler was then placed.  Under direct visualization, the spike is then deployed.  The components were mated.  Orientation is confirmed such that there is no twisting of the descending colon going to the colorectal anastomosis.  There is no tension on this and it remains in the deep pelvis without any tension.  The uterus is retracted anteriorly and we ensure there are no other structures included in our staple line.  The stapler was then closed, held, and fired. This was then removed. The donuts were inspected and noted to be complete.  The colon proximal to the anastomosis was then gently occluded. The pelvis was filled with sterile irrigation. Under  direct visualization, we passed a flexible sigmoidoscope.  The anastomosis was under water.  With good distention of the anastomosis there was no air leak. The anastomosis pink in appearance.  This is located at 8 cm from the anal verge by flexible sigmoidoscopy.  It is hemostatic.  Additionally, looking from above, there is no tension on the colon or mesentery.  Sigmoidoscope was withdrawn.  Irrigation was evacuated from the pelvis.  The abdomen and pelvis are surveyed and noted to be completely hemostatic without any apparent injury.  Under direct visualization, all trochars are removed.  The Diomede wound protector was removed.  Gowns/gloves are changed and a fresh set of clean instruments utilized.   The rectus fascia at the colostomy takedown site is closed using 2 running #1 PDS sutures  in a longitudinal manner as this results in the least amount of tension. Additional anesthetic was infiltrated at the colostomy closure site.  Sponge, needle, and instrument counts were reported correct x2.  A 0 Vicryl suture was used to create a pursestring type closure of the skin at the colostomy closure site.  This is then wiped with a moist 4 x 4.  4-0 Monocryl subcuticular suture was used to close the skin of all incision sites.  Dermabond was placed over all incisions.  Additional dry 4 x 4's and tape were placed over the colostomy closure site.  She was then taken out of lithotomy, awakened from anesthesia, extubated, and transferred to a stretcher for transport to PACU in satisfactory condition having tolerated the procedure well.

## 2022-01-12 NOTE — Transfer of Care (Signed)
Immediate Anesthesia Transfer of Care Note  Patient: Carla Ramirez  Procedure(s) Performed: Procedure(s): XI ROBOTIC ASSISTED COLOSTOMY TAKEDOWN AND INTRAOPERATIVE ASSESSMENT OF PERFUSION ICG (N/A) POSSIBLE XI ROBOT ASSISTED LOW ANTERIOR RESECTION (N/A) LYSIS OF ADHESION (N/A) FLEXIBLE SIGMOIDOSCOPY (N/A) CYSTOSCOPY with FIREFLY INJECTION (Bilateral)  Patient Location: PACU  Anesthesia Type:General  Level of Consciousness: Alert, Awake, Oriented  Airway & Oxygen Therapy: Patient Spontanous Breathing  Post-op Assessment: Report given to RN  Post vital signs: Reviewed and stable  Last Vitals:  Vitals:   01/12/22 0841  BP: (!) 155/76  Pulse: (!) 57  Resp: 20  Temp: 36.7 C  SpO2: 940%    Complications: No apparent anesthesia complications

## 2022-01-12 NOTE — Anesthesia Procedure Notes (Signed)
Procedure Name: Intubation Date/Time: 01/12/2022 11:15 AM  Performed by: Gerald Leitz, CRNAPre-anesthesia Checklist: Patient identified, Patient being monitored, Timeout performed, Emergency Drugs available and Suction available Patient Re-evaluated:Patient Re-evaluated prior to induction Oxygen Delivery Method: Circle system utilized Preoxygenation: Pre-oxygenation with 100% oxygen Induction Type: IV induction Ventilation: Mask ventilation without difficulty Laryngoscope Size: Mac and 3 Grade View: Grade I Tube type: Oral Tube size: 7.0 mm Number of attempts: 1 Airway Equipment and Method: Stylet Placement Confirmation: ETT inserted through vocal cords under direct vision, positive ETCO2 and breath sounds checked- equal and bilateral Secured at: 21 cm Tube secured with: Tape Dental Injury: Teeth and Oropharynx as per pre-operative assessment  Difficulty Due To: Difficult Airway- due to anterior larynx

## 2022-01-12 NOTE — H&P (Signed)
CC: Here today for surgery  HPI: Carla Ramirez is an 71 y.o. female with history of HTN, DM, whom is seen in the office today as a follow-up referral by Dr. Carolynne Edouard for colostomy status - she was taken to the operating room emergently 06/27/2021 and underwent Hartman's procedure for perforated diverticulitis with free air.  The sigmoid was removed and an end colostomy fashion. Rectal stump was marked with a Prolene. She was admitted postoperatively and recovered uneventfully. She was discharged home on POD#6.  Pathology demonstrated acute perforated diverticulitis.  Her last colonoscopy was in 2014 with [facility] Dr. Ewing Schlein showing diverticulosis and hemorrhoids.  She is here today with her husband. She denies any complaints. Her colostomy is been working well and she had no issues. She is periodically following with the wound ostomy nurses as an outpatient.  Gastrograffin enema 09/15/21 -Hartmann pouch with short segment of distal sigmoid with mild diverticulosis and hypertrophy. Updated colonoscopy with Dr. Ewing Schlein 10/13/2021 demonstrated diverticulosis in the sigmoid, descending, and distal transverse colon. Normal ileum.  Still with occasional spitting wound at the central portion of her prior midline incision. Suspect a suture granuloma. Denies any abdominal pain, fever, chills, nausea, vomiting. Remains motivated to have her colostomy taken down. She is here today with her husband.   INTERVAL HX Syncopal event with prep last month resulted in procedure being rescheduled - this was almost certainly related to bowel prep. Regardless, cardiac clearance obtained. She denies any changes in her health or health history otherwise since we met. States she remains motivated to have her ostomy reversed. Tolerated lower dose of the prep with satisfactory result.  PMH: HTN, DM  PSH: Exlap/sigmoidectomy/colostomy 06/27/21 - Dr. Carolynne Edouard; prior appendectomy  FHx: Denies any known family history of colorectal,  breast, endometrial or ovarian cancer  Social Hx: Denies use of tobacco/EtOH/illicit drug.   Review of Systems: A complete review of systems was obtained from the patient. I have reviewed this information and discussed as appropriate with the patient. See HPI as well for other ROS.  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  Dr. Nicholas Lose follows.   Memory change 08/16/2015   Spinal stenosis     Past Surgical History:  Procedure Laterality Date   APPENDECTOMY     COLONOSCOPY WITH ESOPHAGOGASTRODUODENOSCOPY (EGD)     COLOSTOMY     LAPAROTOMY N/A 06/27/2021   Procedure: EXPLORATORY LAPAROTOMY, SIGMOID COLECTOMY WITH COLOSTOMY;  Surgeon: Griselda Miner, MD;  Location: [facility];  Service: General;  Laterality: N/A;   RENAL ANGIOGRAPHY Right 02/05/2019   Procedure: RENAL ANGIOGRAPHY;  Surgeon: Cephus Shelling, MD;  Location: [facility];  Service: Cardiovascular;  Laterality: Right;    Family History  Problem Relation Age of Onset   Stroke Mother    Dementia Mother    Lymphoma Brother             Social:  reports that she has never smoked. She has never used smokeless tobacco. She reports current alcohol use. She reports that she does not use drugs.  Allergies:  Allergies  Allergen Reactions   Sulfa Antibiotics Swelling    Facial swelling    Medications: I have reviewed the patient's current medications.  No results found for this or any previous visit (from the past 48 hour(s)).  No results found.  ROS - all of the below systems have been reviewed with the patient and positives are indicated with bold text General: chills, fever or night sweats Eyes: blurry vision or double vision ENT: epistaxis or sore throat Allergy/Immunology: itchy/watery eyes or nasal  congestion Hematologic/Lymphatic: bleeding problems, blood clots or swollen lymph nodes Endocrine: temperature intolerance or unexpected weight changes Breast: new or changing breast lumps or nipple discharge Resp: cough, shortness of breath, or wheezing CV: chest pain or dyspnea on exertion GI: as per HPI GU: dysuria, trouble voiding, or hematuria MSK: joint pain or joint stiffness Neuro: TIA or stroke symptoms Derm: pruritus and skin lesion changes Psych: anxiety and depression  PE Blood pressure (!) 155/76, pulse (!) 57, temperature 98.1 F (36.7 C), temperature source Oral, resp. rate 20, height $RemoveBe'5\' 2"'MibCIYLds$  (1.575 m), weight 60.1 kg, last menstrual period 05/30/2003, SpO2 100 %. Constitutional: NAD; conversant; no deformities Eyes: Moist conjunctiva Lungs: Normal respiratory effort CV: RRR GI: Abd soft, NT/NT MSK: Normal range of motion of extremities Psychiatric: Appropriate affect; alert and oriented x3  No results found for this or any previous visit (from the past 48 hour(s)).  No results found.  A/P: Carla Ramirez is an 71 y.o. female with hx of HTN, DM here for surgery for colostomy takedown  -She is highly motivated to undergo colostomy reversal surgery.  -The anatomy and physiology of the GI tract was reviewed with the patient as it pertains to her current anatomy with her colostomy. This was discussed as well with associated pictures. -She remains interested in proceeding with colostomy takedown surgery. We spent time discussing minimally invasive (robotic assisted colostomy reversal, lysis of adhesions, low anterior resection concurrently potentially based on intraoperative findings, flexible sigmoidoscopy, ureteral ICG by urology) as well as possibility of conversion to open surgery.  -The planned procedures, material risks (including, but not limited to, pain, bleeding, infection, scarring, need for blood transfusion, damage to surrounding structures- blood  vessels/nerves/viscus/organs, damage to ureter, urine leak, leak from anastomosis, need for additional procedures, sexual dysfunction, scenarios where a stoma may be necessary and where it may be permanent, worsening of pre-existing medical conditions, hernia, recurrence, pneumonia, heart attack, stroke, death) benefits and alternatives to surgery were discussed at length. The patient's questions were answered to her satisfaction, she voiced understanding and elected to proceed with surgery. Additionally, we discussed typical postoperative expectations and the recovery process.   Nadeen Landau, [facility], [place]

## 2022-01-12 NOTE — Anesthesia Postprocedure Evaluation (Signed)
Anesthesia Post Note  Patient: Carla Ramirez  Procedure(s) Performed: XI ROBOTIC ASSISTED COLOSTOMY TAKEDOWN AND INTRAOPERATIVE ASSESSMENT OF PERFUSION ICG POSSIBLE XI ROBOT ASSISTED LOW ANTERIOR RESECTION LYSIS OF ADHESION FLEXIBLE SIGMOIDOSCOPY CYSTOSCOPY with FIREFLY INJECTION (Bilateral)     Patient location during evaluation: PACU Anesthesia Type: General Level of consciousness: awake and alert Pain management: pain level controlled Vital Signs Assessment: post-procedure vital signs reviewed and stable Respiratory status: spontaneous breathing, nonlabored ventilation, respiratory function stable and patient connected to nasal cannula oxygen Cardiovascular status: blood pressure returned to baseline and stable Postop Assessment: no apparent nausea or vomiting Anesthetic complications: no   No notable events documented.  Last Vitals:  Vitals:   01/12/22 1515 01/12/22 1544  BP: 136/64 (!) 140/72  Pulse: (!) 54 63  Resp: 12 16  Temp:    SpO2: 94% 97%    Last Pain:  Vitals:   01/12/22 1544  TempSrc:   PainSc: 0-No pain                 Octave Montrose S

## 2022-01-13 ENCOUNTER — Other Ambulatory Visit (HOSPITAL_COMMUNITY): Payer: Self-pay

## 2022-01-13 ENCOUNTER — Encounter (HOSPITAL_COMMUNITY): Payer: Self-pay | Admitting: Surgery

## 2022-01-13 LAB — BASIC METABOLIC PANEL
Anion gap: 9 (ref 5–15)
BUN: 10 mg/dL (ref 8–23)
CO2: 25 mmol/L (ref 22–32)
Calcium: 8.8 mg/dL — ABNORMAL LOW (ref 8.9–10.3)
Chloride: 104 mmol/L (ref 98–111)
Creatinine, Ser: 0.76 mg/dL (ref 0.44–1.00)
GFR, Estimated: >60 mL/min (ref >60)
Glucose, Bld: 108 mg/dL — ABNORMAL HIGH (ref 70–99)
Potassium: 3.6 mmol/L (ref 3.5–5.1)
Sodium: 138 mmol/L (ref 135–145)

## 2022-01-13 LAB — CBC
HCT: 32 % — ABNORMAL LOW (ref 36.0–46.0)
Hemoglobin: 11.2 g/dL — ABNORMAL LOW (ref 12.0–15.0)
MCH: 32.1 pg (ref 26.0–34.0)
MCHC: 35 g/dL (ref 30.0–36.0)
MCV: 91.7 fL (ref 80.0–100.0)
Platelets: 238 10*3/uL (ref 150–400)
RBC: 3.49 MIL/uL — ABNORMAL LOW (ref 3.87–5.11)
RDW: 11.9 % (ref 11.5–15.5)
WBC: 8.7 10*3/uL (ref 4.0–10.5)
nRBC: 0 % (ref 0.0–0.2)

## 2022-01-13 MED ORDER — TRAMADOL HCL 50 MG PO TABS
50.0000 mg | ORAL_TABLET | Freq: Four times a day (QID) | ORAL | 0 refills | Status: AC | PRN
  Filled 2022-01-13: qty 15, 4d supply, fill #0

## 2022-01-13 NOTE — Discharge Instructions (Signed)
POST OP INSTRUCTIONS AFTER COLON SURGERY  DIET: Be sure to include lots of fluids daily to stay hydrated - 64oz of water per day (8, 8 oz glasses).  Avoid fast food or heavy meals for the first couple of weeks as your are more likely to get nauseated. Avoid raw/uncooked fruits or vegetables for the first 4 weeks (its ok to have these if they are blended into smoothie form). If you have fruits/vegetables, make sure they are cooked until soft enough to mash on the roof of your mouth and chew your food well. Otherwise, diet as tolerated.  Take your usually prescribed home medications unless otherwise directed.  PAIN CONTROL: Pain is best controlled by a usual combination of three different methods TOGETHER: Ice/Heat Over the counter pain medication Prescription pain medication Most patients will experience some swelling and bruising around the surgical site.  Ice packs or heating pads (30-60 minutes up to 6 times a day) will help. Some people prefer to use ice alone, heat alone, alternating between ice & heat.  Experiment to what works for you.  Swelling and bruising can take several weeks to resolve.   It is helpful to take an over-the-counter pain medication regularly for the first few weeks: Ibuprofen (Motrin/Advil) - '200mg'$  tabs - take 3 tabs ('600mg'$ ) every 6 hours as needed for pain (unless you have been directed previously to avoid NSAIDs/ibuprofen) Acetaminophen (Tylenol) - you may take '650mg'$  every 6 hours as needed. You can take this with motrin as they act differently on the body. If you are taking a narcotic pain medication that has acetaminophen in it, do not take over the counter tylenol at the same time. NOTE: You may take both of these medications together - most patients  find it most helpful when alternating between the two (i.e. Ibuprofen at 6am, tylenol at 9am, ibuprofen at 12pm ..Marland Kitchen) A  prescription for pain medication should be given to you upon discharge.  Take your pain medication as  prescribed if your pain is not adequatly controlled with the over-the-counter pain reliefs mentioned above.  Avoid getting constipated.  Between the surgery and the pain medications, it is common to experience some constipation.  Increasing fluid intake and taking a fiber supplement (such as Metamucil, Citrucel, FiberCon, MiraLax, etc) 1-2 times a day regularly will usually help prevent this problem from occurring.  A mild laxative (prune juice, Milk of Magnesia, MiraLax, etc) should be taken according to package directions if there are no bowel movements after 48 hours.    Dressing: Your incisions are covered in Dermabond which is like sterile superglue for the skin. This will come off on it's own in a couple weeks. It is waterproof and you may bathe normally starting the day after your surgery in a shower. Avoid baths/pools/lakes/oceans until your wounds have fully healed. Prior colostomy site may be covered with moist gauze and changed daily until it heals. Generally this takes ~2-6 weeks to be covered with normal skin  ACTIVITIES as tolerated:   Avoid heavy lifting (>10lbs or 1 gallon of milk) for the next 6 weeks. You may resume regular daily activities as tolerated--such as daily self-care, walking, climbing stairs--gradually increasing activities as tolerated.  If you can walk 30 minutes without difficulty, it is safe to try more intense activity such as jogging, treadmill, bicycling, low-impact aerobics.  DO NOT PUSH THROUGH PAIN.  Let pain be your guide: If it hurts to do something, don't do it. You may drive when you are no  longer taking prescription pain medication, you can comfortably wear a seatbelt, and you can safely maneuver your car and apply brakes.  FOLLOW UP in our office Please call [facility] at [phone] to set up an appointment to see your surgeon in the office for a follow-up appointment approximately 2 weeks after your surgery. Make sure that you call for this appointment the  day you arrive home to insure a convenient appointment time.  9. If you have disability or family leave forms that need to be completed, you may have them completed by your primary care physician's office; for return to work instructions, please ask our office staff and they will be happy to assist you in obtaining this documentation   When to call us [phone]: Poor pain control Reactions / problems with new medications (rash/itching, etc)  Fever over 101.5 F (38.5 C) Inability to urinate Nausea/vomiting Worsening swelling or bruising Continued bleeding from incision. Increased pain, redness, or drainage from the incision  The clinic staff is available to answer your questions during regular business hours (8:30am-5pm).  Please don't hesitate to call and ask to speak to one of our nurses for clinical concerns.   A surgeon from [facility] is always on call at the hospitals   If you have a medical emergency, go to the nearest emergency room or call 911.  [facility], [place] [address] [place], [place], [place]  [address] MAIN: [phone] FAX: [phone] [email or web address]

## 2022-01-13 NOTE — Progress Notes (Signed)
Pt noted with scant amount of bloody drainage from rectum.Will cont to monitor.

## 2022-01-13 NOTE — Progress Notes (Signed)
  Transition of Care [facility]) Screening Note   Patient Details  Name: Carla Ramirez Date of Birth: 1951-02-21   Transition of [facility]) CM/SW Contact:    Lennart Pall, LCSW Phone Number: 01/13/2022, 9:29 AM    Transition of Care Department [facility]) has reviewed patient and no TOC needs have been identified at this time. We will continue to monitor patient advancement through interdisciplinary progression rounds. If new patient transition needs arise, please place a TOC consult.

## 2022-01-13 NOTE — Progress Notes (Signed)
  Subjective No acute events. Feeling quite well. Up in chair. Couple flatus, no bm yet. No n/v. Tolerating full liquids. Pain well controlled.  Objective: Vital signs in last 24 hours: Temp:  [96.6 F (35.9 C)-98.1 F (36.7 C)] 97.8 F (36.6 C) (08/18 0449) Pulse Rate:  [54-71] 71 (08/18 0449) Resp:  [11-20] 18 (08/18 0449) BP: (123-155)/(56-76) 138/64 (08/18 0449) SpO2:  [94 %-100 %] 97 % (08/18 0449) Weight:  [60.1 kg-62.2 kg] 62.2 kg (08/18 0500) Last BM Date :  (PTA)  Intake/Output from previous day: 08/17 0701 - 08/18 0700 In: 2273.8 [P.O.:240; I.V.:1933.8; IV Piggyback:100] Out: 2065 [Urine:2050; Blood:15] Intake/Output this shift: No intake/output data recorded.  Gen: NAD, comfortable CV: RRR Pulm: Normal work of breathing Abd: Soft, NT/ND. Incisions c/d/I without erythema. Ostomy site wicking removed. Ext: SCDs in place  Lab Results: CBC  Recent Labs    01/13/22 0443  WBC 8.7  HGB 11.2*  HCT 32.0*  PLT 238   BMET Recent Labs    01/13/22 0443  NA 138  K 3.6  CL 104  CO2 25  GLUCOSE 108*  BUN 10  CREATININE 0.76  CALCIUM 8.8*   PT/INR No results for input(s): "LABPROT", "INR" in the last 72 hours. ABG No results for input(s): "PHART", "HCO3" in the last 72 hours.  Invalid input(s): "PCO2", "PO2"  Studies/Results:  Anti-infectives: Anti-infectives (From admission, onward)    Start     Dose/Rate Route Frequency Ordered Stop   01/12/22 1400  neomycin (MYCIFRADIN) tablet 1,000 mg  Status:  Discontinued       See Hyperspace for full Linked Orders Report.   1,000 mg Oral 3 times per day 01/12/22 0833 01/12/22 0837   01/12/22 1400  metroNIDAZOLE (FLAGYL) tablet 1,000 mg  Status:  Discontinued       See Hyperspace for full Linked Orders Report.   1,000 mg Oral 3 times per day 01/12/22 0833 01/12/22 0837   01/12/22 0845  cefoTEtan (CEFOTAN) 2 g in sodium chloride 0.9 % 100 mL IVPB        2 g 200 mL/hr over 30 Minutes Intravenous On call to O.R.  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 Answered her and her daughter's questions -Ppx: SQH, SCDs   LOS: 1 day   Nadeen Landau, MD [facility], [place]

## 2022-01-14 ENCOUNTER — Other Ambulatory Visit (HOSPITAL_COMMUNITY): Payer: Self-pay

## 2022-01-14 LAB — BASIC METABOLIC PANEL
Anion gap: 5 (ref 5–15)
BUN: 16 mg/dL (ref 8–23)
CO2: 29 mmol/L (ref 22–32)
Calcium: 8.6 mg/dL — ABNORMAL LOW (ref 8.9–10.3)
Chloride: 107 mmol/L (ref 98–111)
Creatinine, Ser: 0.71 mg/dL (ref 0.44–1.00)
GFR, Estimated: >60 mL/min (ref >60)
Glucose, Bld: 89 mg/dL (ref 70–99)
Potassium: 4 mmol/L (ref 3.5–5.1)
Sodium: 141 mmol/L (ref 135–145)

## 2022-01-14 LAB — CBC
HCT: 30.8 % — ABNORMAL LOW (ref 36.0–46.0)
Hemoglobin: 10.6 g/dL — ABNORMAL LOW (ref 12.0–15.0)
MCH: 32 pg (ref 26.0–34.0)
MCHC: 34.4 g/dL (ref 30.0–36.0)
MCV: 93.1 fL (ref 80.0–100.0)
Platelets: 225 10*3/uL (ref 150–400)
RBC: 3.31 MIL/uL — ABNORMAL LOW (ref 3.87–5.11)
RDW: 12.2 % (ref 11.5–15.5)
WBC: 7.2 10*3/uL (ref 4.0–10.5)
nRBC: 0 % (ref 0.0–0.2)

## 2022-01-14 NOTE — Progress Notes (Signed)
2 Days Post-Op   Subjective/Chief Complaint: No complaints   Objective: Vital signs in last 24 hours: Temp:  [98.3 F (36.8 C)-98.5 F (36.9 C)] 98.3 F (36.8 C) (08/19 0552) Pulse Rate:  [69-75] 71 (08/19 0552) Resp:  [18] 18 (08/19 0552) BP: (123-159)/(63-76) 155/76 (08/19 0552) SpO2:  [96 %-100 %] 96 % (08/19 0552) Last BM Date : 01/12/22  Intake/Output from previous day: 08/18 0701 - 08/19 0700 In: 1966.6 [P.O.:360; I.V.:1606.6] Out: 1370 [Urine:1370] Intake/Output this shift: No intake/output data recorded.  General appearance: alert and cooperative Resp: clear to auscultation bilaterally Cardio: regular rate and rhythm GI: soft, mild tenderness. Good bs  Lab Results:  Recent Labs    01/13/22 0443 01/14/22 0448  WBC 8.7 7.2  HGB 11.2* 10.6*  HCT 32.0* 30.8*  PLT 238 225   BMET Recent Labs    01/13/22 0443 01/14/22 0448  NA 138 141  K 3.6 4.0  CL 104 107  CO2 25 29  GLUCOSE 108* 89  BUN 10 16  CREATININE 0.76 0.71  CALCIUM 8.8* 8.6*   PT/INR No results for input(s): "LABPROT", "INR" in the last 72 hours. ABG No results for input(s): "PHART", "HCO3" in the last 72 hours.  Invalid input(s): "PCO2", "PO2"  Studies/Results: No results found.  Anti-infectives: Anti-infectives (From admission, onward)    Start     Dose/Rate Route Frequency Ordered Stop   01/12/22 1400  neomycin (MYCIFRADIN) tablet 1,000 mg  Status:  Discontinued       See Hyperspace for full Linked Orders Report.   1,000 mg Oral 3 times per day 01/12/22 0833 01/12/22 0837   01/12/22 1400  metroNIDAZOLE (FLAGYL) tablet 1,000 mg  Status:  Discontinued       See Hyperspace for full Linked Orders Report.   1,000 mg Oral 3 times per day 01/12/22 3428 01/12/22 0837   01/12/22 0845  cefoTEtan (CEFOTAN) 2 g in sodium chloride 0.9 % 100 mL IVPB        2 g 200 mL/hr over 30 Minutes Intravenous On call to O.R. 01/12/22 0833 01/12/22 1134       Assessment/Plan: s/p Procedure(s): XI  ROBOTIC ASSISTED COLOSTOMY TAKEDOWN AND INTRAOPERATIVE ASSESSMENT OF PERFUSION ICG (N/A) POSSIBLE XI ROBOT ASSISTED LOW ANTERIOR RESECTION (N/A) LYSIS OF ADHESION (N/A) FLEXIBLE SIGMOIDOSCOPY (N/A) CYSTOSCOPY with FIREFLY INJECTION (Bilateral) Advance diet Ambulate POD 1  LOS: 2 days    Autumn Messing III 01/14/2022

## 2022-01-15 LAB — BASIC METABOLIC PANEL
Anion gap: 5 (ref 5–15)
BUN: 19 mg/dL (ref 8–23)
CO2: 29 mmol/L (ref 22–32)
Calcium: 8.9 mg/dL (ref 8.9–10.3)
Chloride: 103 mmol/L (ref 98–111)
Creatinine, Ser: 0.71 mg/dL (ref 0.44–1.00)
GFR, Estimated: >60 mL/min (ref >60)
Glucose, Bld: 96 mg/dL (ref 70–99)
Potassium: 4 mmol/L (ref 3.5–5.1)
Sodium: 137 mmol/L (ref 135–145)

## 2022-01-15 LAB — CBC
HCT: 32 % — ABNORMAL LOW (ref 36.0–46.0)
Hemoglobin: 10.9 g/dL — ABNORMAL LOW (ref 12.0–15.0)
MCH: 32 pg (ref 26.0–34.0)
MCHC: 34.1 g/dL (ref 30.0–36.0)
MCV: 93.8 fL (ref 80.0–100.0)
Platelets: 220 10*3/uL (ref 150–400)
RBC: 3.41 MIL/uL — ABNORMAL LOW (ref 3.87–5.11)
RDW: 12.4 % (ref 11.5–15.5)
WBC: 6.5 10*3/uL (ref 4.0–10.5)
nRBC: 0 % (ref 0.0–0.2)

## 2022-01-15 NOTE — Progress Notes (Signed)
3 Days Post-Op   Subjective/Chief Complaint: No complaints. No bm yet   Objective: Vital signs in last 24 hours: Temp:  [97.7 F (36.5 C)-98.4 F (36.9 C)] 98.3 F (36.8 C) (08/20 0430) Pulse Rate:  [73-78] 73 (08/20 0430) Resp:  [18] 18 (08/20 0430) BP: (120-143)/(65-79) 120/65 (08/20 0430) SpO2:  [96 %-98 %] 96 % (08/20 0430) Last BM Date : 01/12/22  Intake/Output from previous day: 08/19 0701 - 08/20 0700 In: 1151.7 [P.O.:717; I.V.:434.7] Out: 1300 [Urine:1300] Intake/Output this shift: No intake/output data recorded.  General appearance: alert and cooperative Resp: clear to auscultation bilaterally Cardio: regular rate and rhythm GI: soft, minimal tenderness  Lab Results:  Recent Labs    01/14/22 0448 01/15/22 0428  WBC 7.2 6.5  HGB 10.6* 10.9*  HCT 30.8* 32.0*  PLT 225 220   BMET Recent Labs    01/14/22 0448 01/15/22 0428  NA 141 137  K 4.0 4.0  CL 107 103  CO2 29 29  GLUCOSE 89 96  BUN 16 19  CREATININE 0.71 0.71  CALCIUM 8.6* 8.9   PT/INR No results for input(s): "LABPROT", "INR" in the last 72 hours. ABG No results for input(s): "PHART", "HCO3" in the last 72 hours.  Invalid input(s): "PCO2", "PO2"  Studies/Results: No results found.  Anti-infectives: Anti-infectives (From admission, onward)    Start     Dose/Rate Route Frequency Ordered Stop   01/12/22 1400  neomycin (MYCIFRADIN) tablet 1,000 mg  Status:  Discontinued       See Hyperspace for full Linked Orders Report.   1,000 mg Oral 3 times per day 01/12/22 0833 01/12/22 0837   01/12/22 1400  metroNIDAZOLE (FLAGYL) tablet 1,000 mg  Status:  Discontinued       See Hyperspace for full Linked Orders Report.   1,000 mg Oral 3 times per day 01/12/22 2956 01/12/22 0837   01/12/22 0845  cefoTEtan (CEFOTAN) 2 g in sodium chloride 0.9 % 100 mL IVPB        2 g 200 mL/hr over 30 Minutes Intravenous On call to O.R. 01/12/22 0833 01/12/22 1134       Assessment/Plan: s/p Procedure(s): XI  ROBOTIC ASSISTED COLOSTOMY TAKEDOWN AND INTRAOPERATIVE ASSESSMENT OF PERFUSION ICG (N/A) POSSIBLE XI ROBOT ASSISTED LOW ANTERIOR RESECTION (N/A) LYSIS OF ADHESION (N/A) FLEXIBLE SIGMOIDOSCOPY (N/A) CYSTOSCOPY with FIREFLY INJECTION (Bilateral) Advance diet Ambulate POD 3 Plan for d/c once she has bm  LOS: 3 days    Autumn Messing III 01/15/2022

## 2022-01-16 LAB — SURGICAL PATHOLOGY

## 2022-01-16 NOTE — Progress Notes (Signed)
  Subjective No acute events. Doing great. No complaints. No n/v. Having flatus and now bm.  Objective: Vital signs in last 24 hours: Temp:  [98 F (36.7 C)-98.1 F (36.7 C)] 98 F (36.7 C) (08/21 0414) Pulse Rate:  [75-85] 75 (08/21 0414) Resp:  [16-18] 18 (08/21 0414) BP: (127-154)/(80-89) 154/89 (08/21 0414) SpO2:  [96 %-100 %] 96 % (08/21 0414) Weight:  [63.1 kg] 63.1 kg (08/21 0451) Last BM Date : 01/12/22  Intake/Output from previous day: 08/20 0701 - 08/21 0700 In: 1400 [P.O.:1400] Out: 2600 [Urine:2600] Intake/Output this shift: Total I/O In: 120 [P.O.:120] Out: 200 [Urine:200]  Gen: NAD, comfortable CV: RRR Pulm: Normal work of breathing Abd: Soft, NT/ND Ext: SCDs in place  Lab Results: CBC  Recent Labs    01/14/22 0448 01/15/22 0428  WBC 7.2 6.5  HGB 10.6* 10.9*  HCT 30.8* 32.0*  PLT 225 220   BMET Recent Labs    01/14/22 0448 01/15/22 0428  NA 141 137  K 4.0 4.0  CL 107 103  CO2 29 29  GLUCOSE 89 96  BUN 16 19  CREATININE 0.71 0.71  CALCIUM 8.6* 8.9   PT/INR No results for input(s): "LABPROT", "INR" in the last 72 hours. ABG No results for input(s): "PHART", "HCO3" in the last 72 hours.  Invalid input(s): "PCO2", "PO2"  Studies/Results:  Anti-infectives: Anti-infectives (From admission, onward)    Start     Dose/Rate Route Frequency Ordered Stop   01/12/22 1400  neomycin (MYCIFRADIN) tablet 1,000 mg  Status:  Discontinued       See Hyperspace for full Linked Orders Report.   1,000 mg Oral 3 times per day 01/12/22 0833 01/12/22 0837   01/12/22 1400  metroNIDAZOLE (FLAGYL) tablet 1,000 mg  Status:  Discontinued       See Hyperspace for full Linked Orders Report.   1,000 mg Oral 3 times per day 01/12/22 0833 01/12/22 0837   01/12/22 0845  cefoTEtan (CEFOTAN) 2 g in sodium chloride 0.9 % 100 mL IVPB        2 g 200 mL/hr over 30 Minutes Intravenous On call to O.R. 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   Pneumoperitoneum 06/27/2021   Perforation of sigmoid colon due to diverticulitis 06/27/2021   Renal artery stenosis ([place]) 03/28/2019   Memory change 08/16/2015   Leukopenia 03/19/2014   Iron deficiency anemia 03/19/2014   Essential hypertension, benign 02/05/2014   Hemorrhoids 02/05/2014   Acid reflux 02/21/2013   s/p Procedure(s): XI ROBOTIC ASSISTED COLOSTOMY TAKEDOWN AND INTRAOPERATIVE ASSESSMENT OF PERFUSION ICG POSSIBLE XI ROBOT ASSISTED LOW ANTERIOR RESECTION LYSIS OF ADHESION FLEXIBLE SIGMOIDOSCOPY CYSTOSCOPY with FIREFLY INJECTION 01/12/2022  -Comfortable with and stable for discharge home   LOS: 4 days    Nadeen Landau, MD [facility], [place]

## 2022-01-16 NOTE — Discharge Summary (Signed)
Patient ID: CORTNEE STEINMILLER MRN: [id number] DOB/AGE: 71-Apr-1952 71 y.o.  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   Leukopenia 03/19/2014   Iron deficiency anemia 03/19/2014   Essential hypertension, benign 02/05/2014   Hemorrhoids 02/05/2014   Acid reflux 02/21/2013    Procedures OR 8/17 - Robotic assisted takedown of end colostomy Robotic assisted low anterior resection with colorectal anastomosis at 8 cm (necessary due to stricture in proximal most rectum) Diagnostic flexible sigmoidoscopy (necessary to assess strictured segment of rectum) Intraoperative assessment of perfusion using ICG fluorescence imaging Flexible sigmoidoscopy Bilateral transversus abdominus plane (TAP) blocks   Hospital Course: Admitted postoperatively where she recovered well and uneventfully. Stable for discharge home 8/21.    Allergies as of 01/16/2022       Reactions   Sulfa Antibiotics Swelling   Facial swelling        Medication List     TAKE these medications    acetaminophen 650 MG CR tablet Commonly known as: TYLENOL Take 650 mg by mouth at bedtime as needed for pain.   ALPRAZolam 0.25 MG tablet Commonly known as: XANAX Take 0.25 mg by mouth 2 (two) times daily as needed for anxiety.   amLODIPine-Valsartan-HCTZ 5-160-12.5 MG Tabs Take 1 tablet by mouth daily.   atenolol 100 MG tablet Commonly known as: TENORMIN Take 100 mg by mouth every evening.   Colace 100 MG capsule Generic drug: docusate sodium Take 100 mg by mouth 2 (two) times daily.   MiraLax 17 GM/SCOOP powder Generic drug: polyethylene glycol powder Take 17 g by mouth daily.   nystatin-triamcinolone ointment Commonly known as:  MYCOLOG Apply 1 Application topically 2 (two) times daily.   Systane Balance 0.6 % Soln Generic drug: Propylene Glycol Place 1 drop into both eyes daily as needed (dry eyes).   traMADol 50 MG tablet Commonly known as: Ultram Take 1 tablet (50 mg total) by mouth every 6 (six) hours as needed for up to 5 days (postop pain not controlled with tylenol/ibuprofen first).   Viactiv Calcium Immune 650-20-5.5 MG-MCG-MG Chew Generic drug: Calcium-Cholecalciferol-Zinc Chew 1 tablet by mouth daily.   Vitamin D3 50 MCG (2000 UT) capsule Take 2,000 Units by mouth daily.   ZINC PO Take 1 tablet by mouth daily.          Follow-up Information     Ileana Roup, MD Follow up in 2 week(s).   Specialties: General Surgery, Colon and Rectal Surgery Why: ~2-4 weeks for postop appointment Contact information: [place] [phone]                 Sharon Mt. Dema Severin, M.D. [facility]

## 2022-01-16 NOTE — Progress Notes (Signed)
Patient was given discharge instructions, and all questions were answered. Patient was stable for discharge and was walked to the main exit. 

## 2022-02-20 DIAGNOSIS — M19042 Primary osteoarthritis, left hand: Secondary | ICD-10-CM | POA: Diagnosis not present

## 2022-02-20 DIAGNOSIS — D649 Anemia, unspecified: Secondary | ICD-10-CM | POA: Diagnosis not present

## 2022-02-20 DIAGNOSIS — S0031XA Abrasion of nose, initial encounter: Secondary | ICD-10-CM | POA: Diagnosis not present

## 2022-02-20 DIAGNOSIS — M19041 Primary osteoarthritis, right hand: Secondary | ICD-10-CM | POA: Diagnosis not present

## 2022-02-20 DIAGNOSIS — Z9889 Other specified postprocedural states: Secondary | ICD-10-CM | POA: Diagnosis not present

## 2022-04-05 IMAGING — CT CT ABD-PELV W/ CM
2 of 5 series · 15 of 46 positions shown, 17 images · IV contrast (agent unspecified)
Comparison: Complete abdomen ultrasound dated 07/20/2014

CLINICAL DATA: Acute, non localized abdominal pain since last
night. Some relief with Pepto-Bismol.

EXAM:
CT ABDOMEN AND PELVIS WITH CONTRAST
TECHNIQUE: Multidetector CT imaging of the abdomen and pelvis was performed
using the standard protocol following bolus administration of
intravenous contrast.

[Series 2: axial st · axial · 0.68mm/px · z∈[+1090,+1460]mm · 12 of 86 slices shown, 14 images]
[im 6/86  soft-tissue]
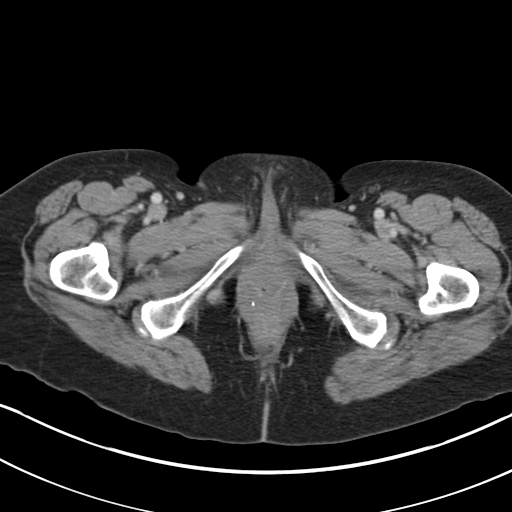
[im 6/86  bone]
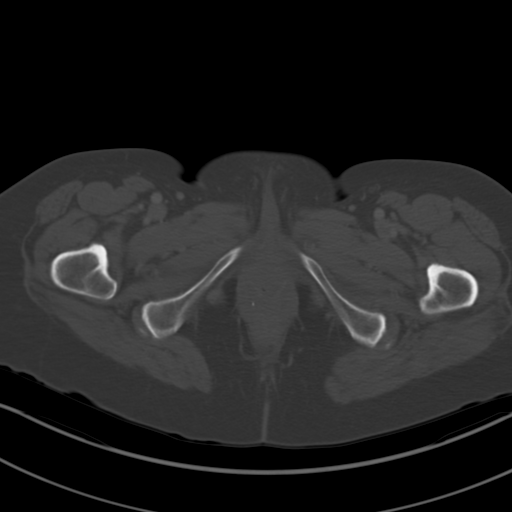
[im 11/86  soft-tissue]
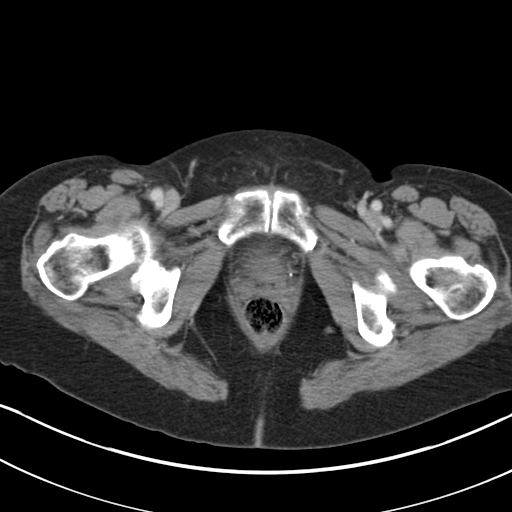
[im 22/86  soft-tissue]
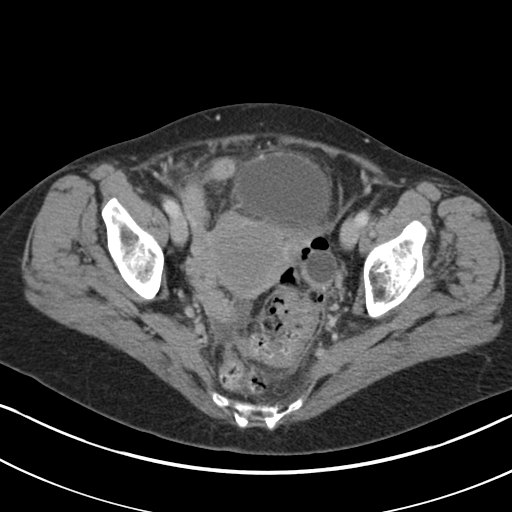
[im 27/86  soft-tissue]
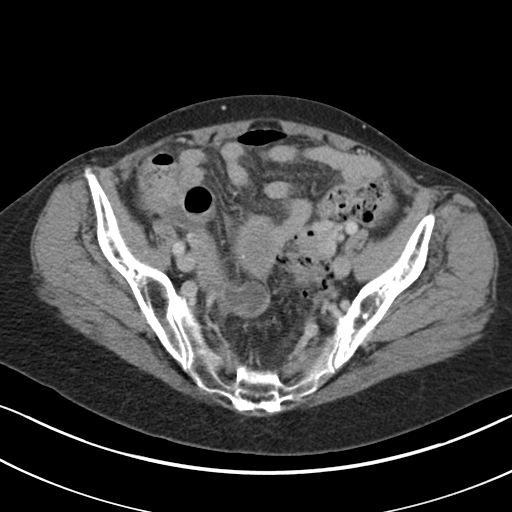
[im 32/86  soft-tissue]
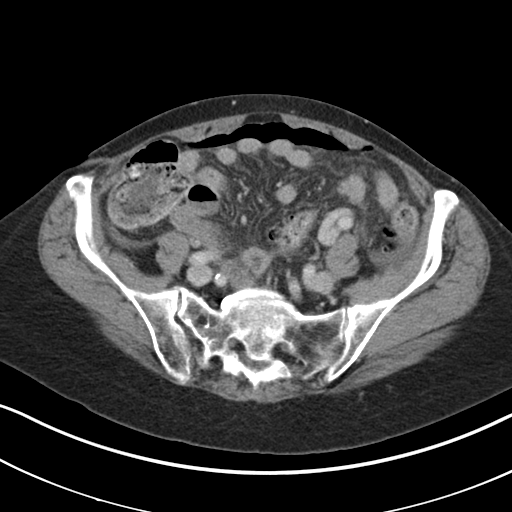
[im 38/86  soft-tissue]
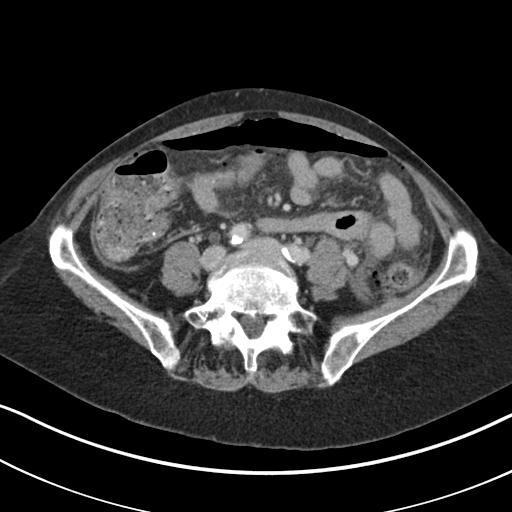
[im 48/86  soft-tissue]
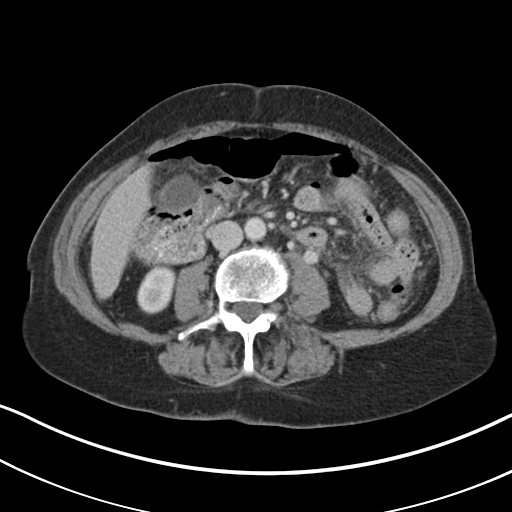
[im 54/86  soft-tissue]
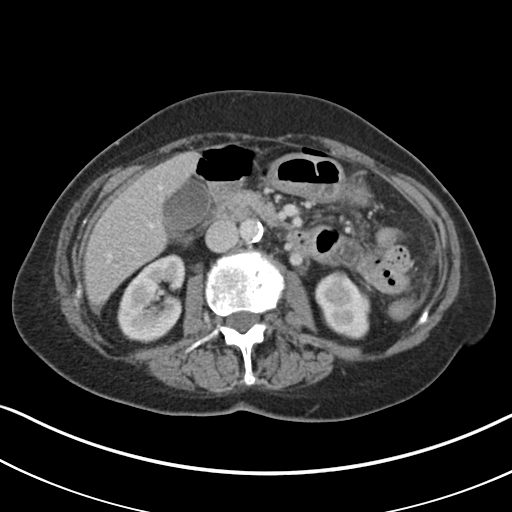
[im 59/86  soft-tissue]
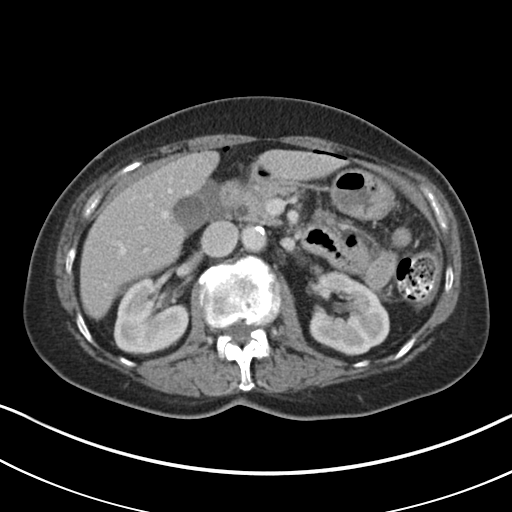
[im 59/86  bone]
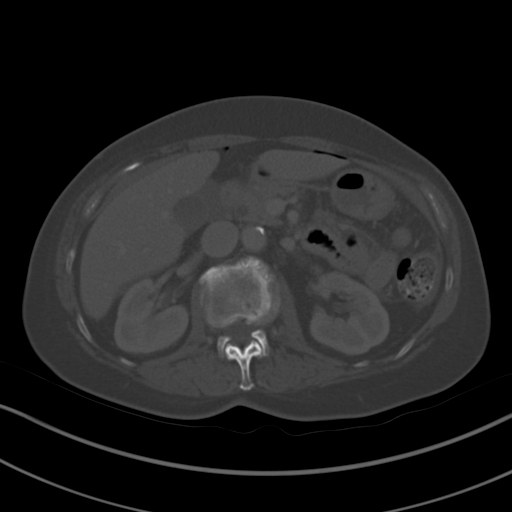
[im 64/86  soft-tissue]
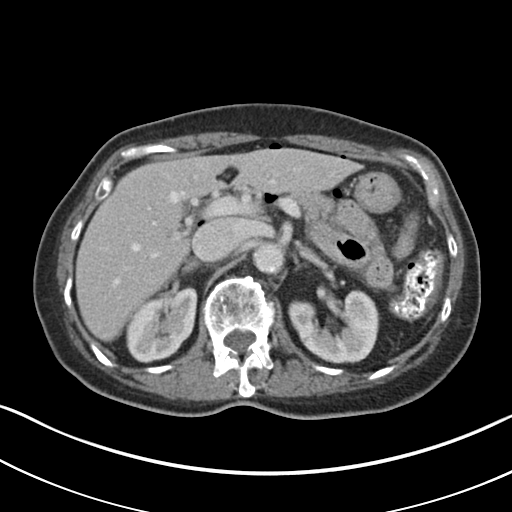
[im 75/86  soft-tissue]
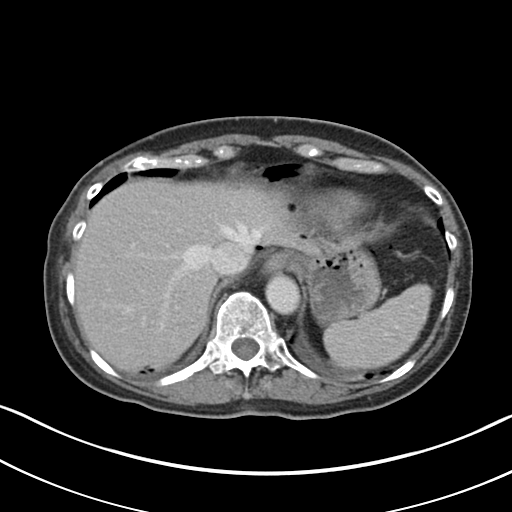
[im 80/86  soft-tissue]
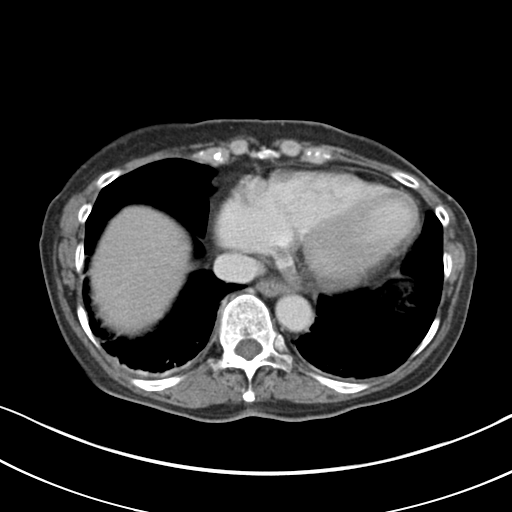

[Series 5: coronal st · coronal · 0.65mm/px · 3 of 141 slices shown]
[im 47/141  soft-tissue]
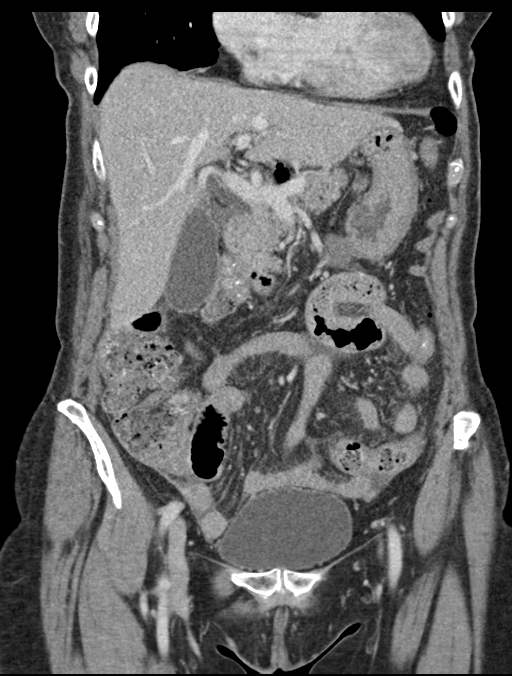
[im 63/141  soft-tissue]
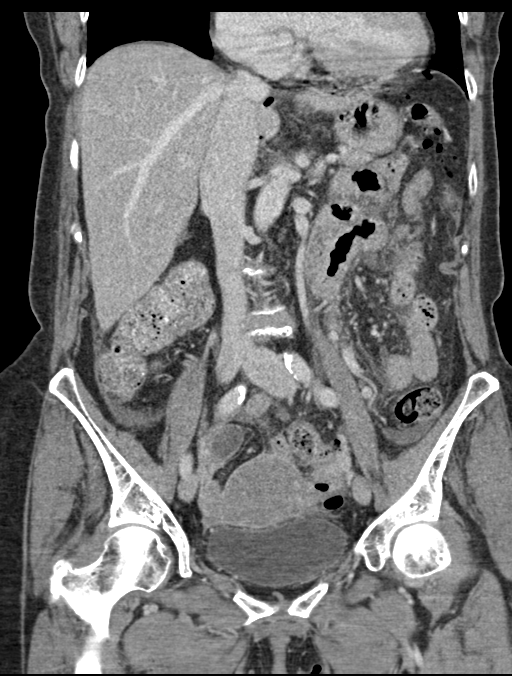
[im 78/141  soft-tissue]
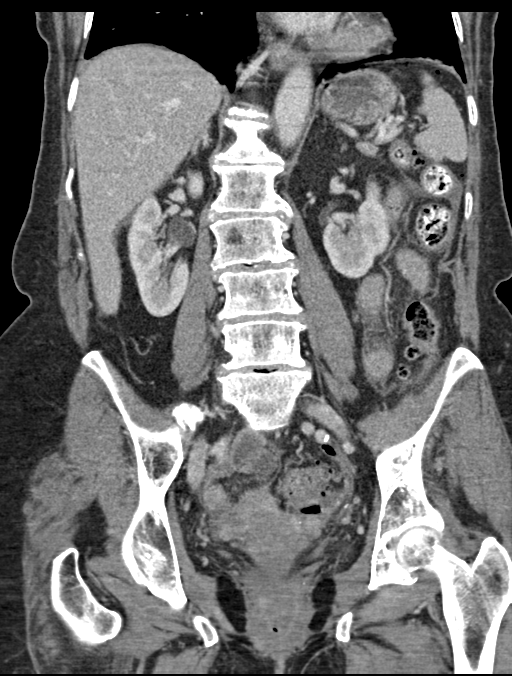

[15 of 46 positions shown; findings below may reference images not displayed]

RADIATION DOSE REDUCTION: This exam was performed according to the
departmental dose-optimization program which includes automated
exposure control, adjustment of the mA and/or kV according to
patient size and/or use of iterative reconstruction technique.

CONTRAST:  100mL OMNIPAQUE IOHEXOL 300 MG/ML  SOLN
FINDINGS: Lower chest: Enlarged heart. Mild right and minimal left dependent
atelectasis.

Hepatobiliary: Small liver cyst.

Pancreas: Unremarkable. No pancreatic ductal dilatation or
surrounding inflammatory changes.

Spleen: Normal in size without focal abnormality.

Adrenals/Urinary Tract: Adrenal glands are unremarkable. Kidneys are
normal, without renal calculi, focal lesion, or hydronephrosis.
Bladder is unremarkable.

Stomach/Bowel: Multiple normal caliber small bowel loops with
diffuse wall thickening and adjacent soft tissue stranding and
edema. No pneumatosis is seen.

Multiple colonic diverticula without evidence of diverticulitis.
Multiple high density particles in the stool compatible with the
history of taking Pepto-Bismol.

Unremarkable stomach.  The appendix is not visualized.

Vascular/Lymphatic: Atheromatous arterial calcifications without
aneurysm. No enlarged lymph nodes.

Reproductive: 2.1 cm simple appearing left ovarian cyst.
Unremarkable uterus and right adnexa.

Other: Moderate amount of free peritoneal air. Small amount of free
peritoneal fluid.

Musculoskeletal: Lumbar and lower thoracic spine degenerative
changes.
IMPRESSION: 1. Moderate amount of free peritoneal air, compatible with bowel
perforation.
2. Multiple small bowel loops with diffuse wall thickening and
adjacent soft tissue edema. This is most likely due to infectious
enteritis. This does not have the typical distribution of ischemic
changes.
3. Extensive colonic diverticulosis with no localized findings
suspicious for diverticulitis.
4. Cardiomegaly.
5. 2.1 cm simple appearing left ovarian cyst. No follow-up imaging
recommended. Note: This recommendation does not apply to those with
increased risk (genetic, family history, elevated tumor markers or
other high-risk factors) of ovarian cancer. Reference: JACR [DATE]):[id number]

Critical Value/emergent results were called by telephone at the time
of interpretation on 06/27/2021 at [DATE] to provider TUCHI LITON ,
who verbally acknowledged these results.

## 2022-04-12 DIAGNOSIS — D2222 Melanocytic nevi of left ear and external auricular canal: Secondary | ICD-10-CM | POA: Diagnosis not present

## 2022-04-12 DIAGNOSIS — L821 Other seborrheic keratosis: Secondary | ICD-10-CM | POA: Diagnosis not present

## 2022-04-12 DIAGNOSIS — D1801 Hemangioma of skin and subcutaneous tissue: Secondary | ICD-10-CM | POA: Diagnosis not present

## 2022-04-12 DIAGNOSIS — D692 Other nonthrombocytopenic purpura: Secondary | ICD-10-CM | POA: Diagnosis not present

## 2022-04-12 DIAGNOSIS — Z85820 Personal history of malignant melanoma of skin: Secondary | ICD-10-CM | POA: Diagnosis not present

## 2022-04-12 DIAGNOSIS — D225 Melanocytic nevi of trunk: Secondary | ICD-10-CM | POA: Diagnosis not present

## 2022-04-12 DIAGNOSIS — L853 Xerosis cutis: Secondary | ICD-10-CM | POA: Diagnosis not present

## 2022-04-12 DIAGNOSIS — L570 Actinic keratosis: Secondary | ICD-10-CM | POA: Diagnosis not present

## 2022-04-12 DIAGNOSIS — Z85828 Personal history of other malignant neoplasm of skin: Secondary | ICD-10-CM | POA: Diagnosis not present

## 2022-04-12 DIAGNOSIS — H35372 Puckering of macula, left eye: Secondary | ICD-10-CM | POA: Diagnosis not present

## 2022-05-04 DIAGNOSIS — H04123 Dry eye syndrome of bilateral lacrimal glands: Secondary | ICD-10-CM | POA: Diagnosis not present

## 2022-05-30 DIAGNOSIS — Z79899 Other long term (current) drug therapy: Secondary | ICD-10-CM | POA: Diagnosis not present

## 2022-05-30 DIAGNOSIS — K219 Gastro-esophageal reflux disease without esophagitis: Secondary | ICD-10-CM | POA: Diagnosis not present

## 2022-05-30 DIAGNOSIS — D039 Melanoma in situ, unspecified: Secondary | ICD-10-CM | POA: Diagnosis not present

## 2022-05-30 DIAGNOSIS — I701 Atherosclerosis of renal artery: Secondary | ICD-10-CM | POA: Diagnosis not present

## 2022-05-30 DIAGNOSIS — Z9889 Other specified postprocedural states: Secondary | ICD-10-CM | POA: Diagnosis not present

## 2022-05-30 DIAGNOSIS — F411 Generalized anxiety disorder: Secondary | ICD-10-CM | POA: Diagnosis not present

## 2022-05-30 DIAGNOSIS — E559 Vitamin D deficiency, unspecified: Secondary | ICD-10-CM | POA: Diagnosis not present

## 2022-05-30 DIAGNOSIS — I10 Essential (primary) hypertension: Secondary | ICD-10-CM | POA: Diagnosis not present

## 2022-05-30 DIAGNOSIS — Z0000 Encounter for general adult medical examination without abnormal findings: Secondary | ICD-10-CM | POA: Diagnosis not present

## 2022-06-01 ENCOUNTER — Telehealth: Payer: Self-pay | Admitting: Neurology

## 2022-06-01 NOTE — Telephone Encounter (Signed)
LVM and sent mychart msg informing pt of appointment change due to NP being out 

## 2022-06-06 DIAGNOSIS — M25511 Pain in right shoulder: Secondary | ICD-10-CM | POA: Diagnosis not present

## 2022-06-06 DIAGNOSIS — M79674 Pain in right toe(s): Secondary | ICD-10-CM | POA: Diagnosis not present

## 2022-06-14 DIAGNOSIS — M6281 Muscle weakness (generalized): Secondary | ICD-10-CM | POA: Diagnosis not present

## 2022-06-14 DIAGNOSIS — M25611 Stiffness of right shoulder, not elsewhere classified: Secondary | ICD-10-CM | POA: Diagnosis not present

## 2022-06-14 DIAGNOSIS — M7541 Impingement syndrome of right shoulder: Secondary | ICD-10-CM | POA: Diagnosis not present

## 2022-06-14 DIAGNOSIS — S46011D Strain of muscle(s) and tendon(s) of the rotator cuff of right shoulder, subsequent encounter: Secondary | ICD-10-CM | POA: Diagnosis not present

## 2022-06-24 IMAGING — RF DG BE SINGLE CONTRAST
3 series · 11 of 11 positions shown · IV contrast (omnipaque)
Comparison: None.

CLINICAL DATA: Sigmoid colon resection and colostomy for perforated
diverticulitis. Preop evaluation for colostomy takedown.

EXAM:
SINGLE CONTRAST ENEMA
TECHNIQUE: Initial scout AP supine abdominal image obtained to insure adequate
colon cleansing. Diluted Omnipaque 300 was introduced into the colon
in a retrograde fashion. Spot images of the colon were obtained.
FLUOROSCOPY:
Radiation Exposure Index (as provided by the fluoroscopic device):
49.7 mGy Kerma

[Series 1: one shot · 0.14mm/px · 2 of 2 slices shown (1 of 2)]
[im 1/2]
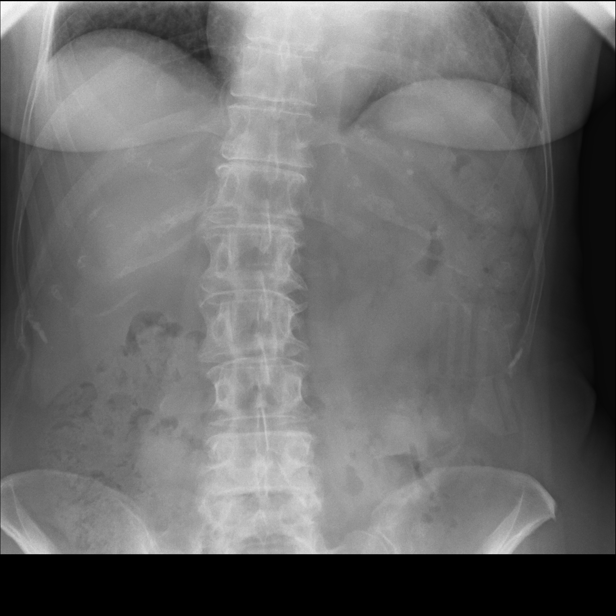
[im 2/2]
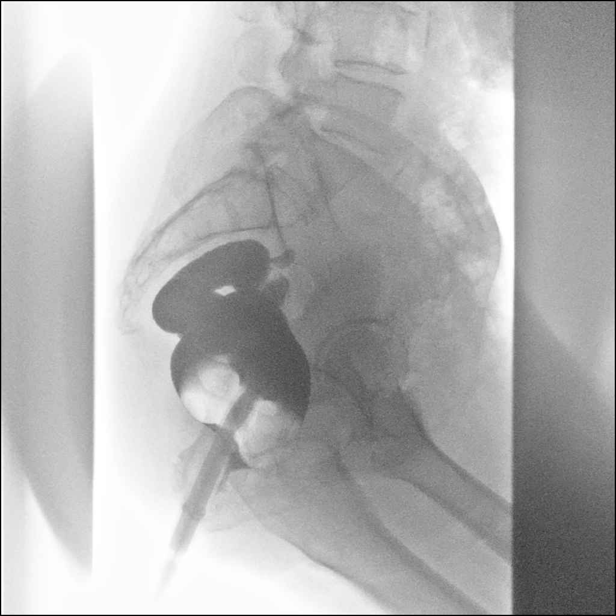

[Series 2: sequence · 4 of 9 frames shown]
[frame 2/9]
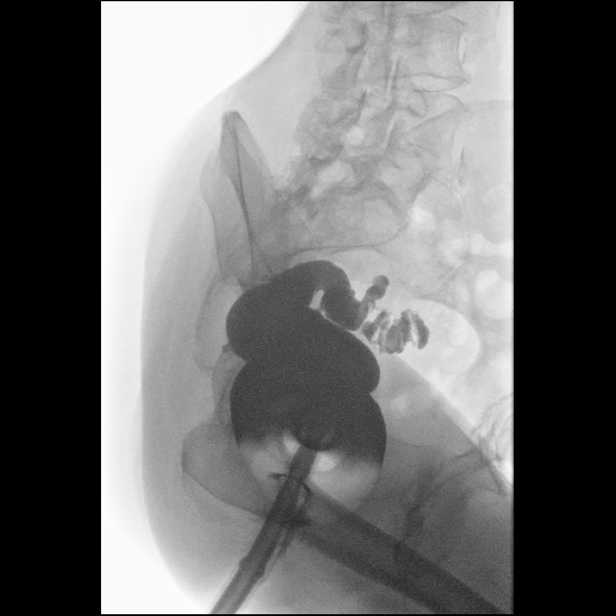
[frame 5/9]
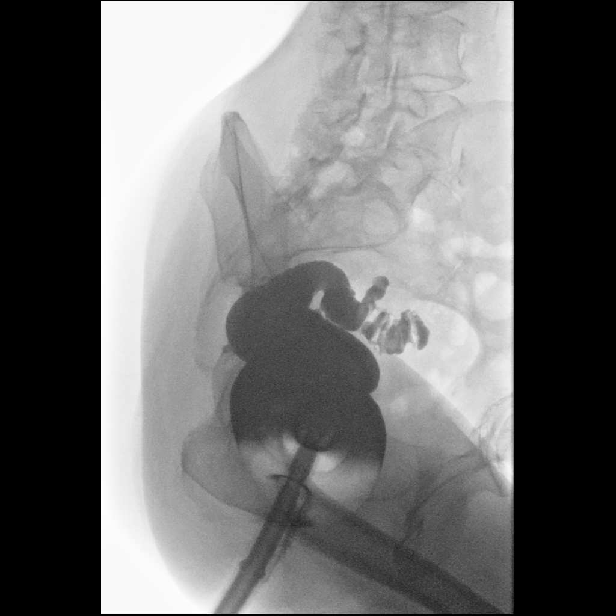
[frame 8/9]
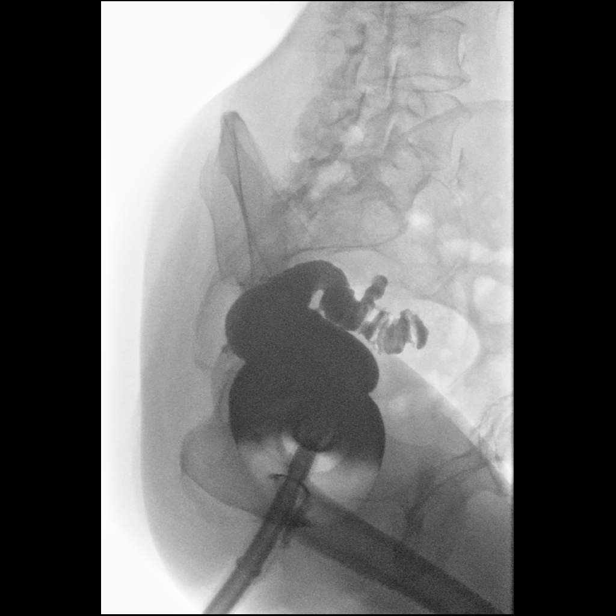
[frame 9/9]
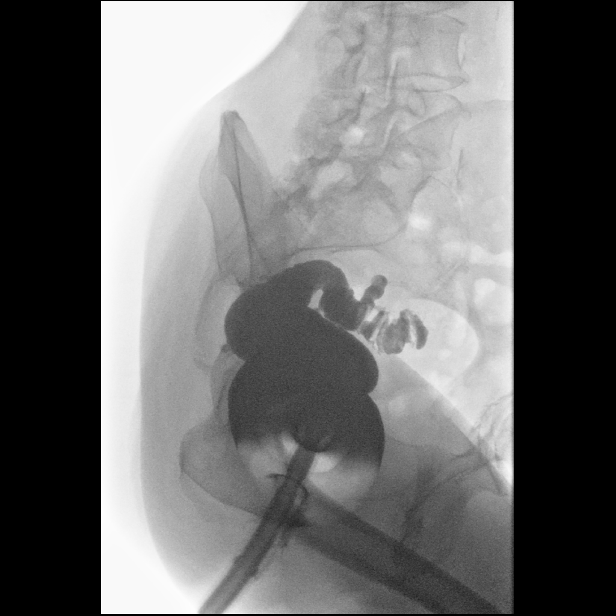

[Series 3: one shot · 5 of 5 slices shown (2 of 2)]
[im 1/5]
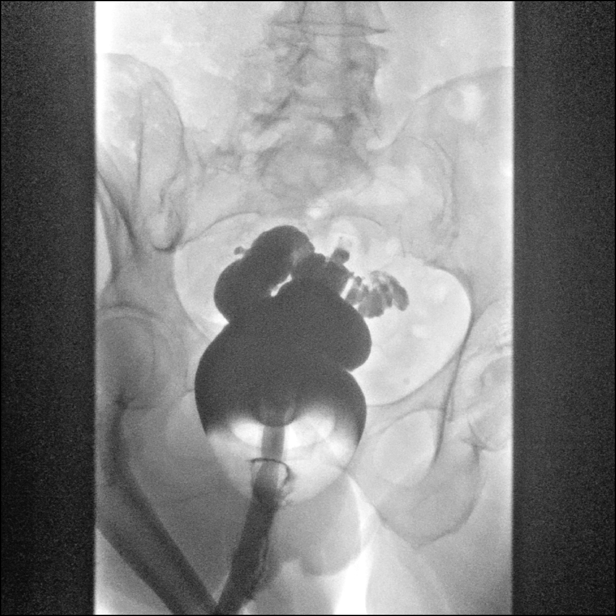
[im 2/5]
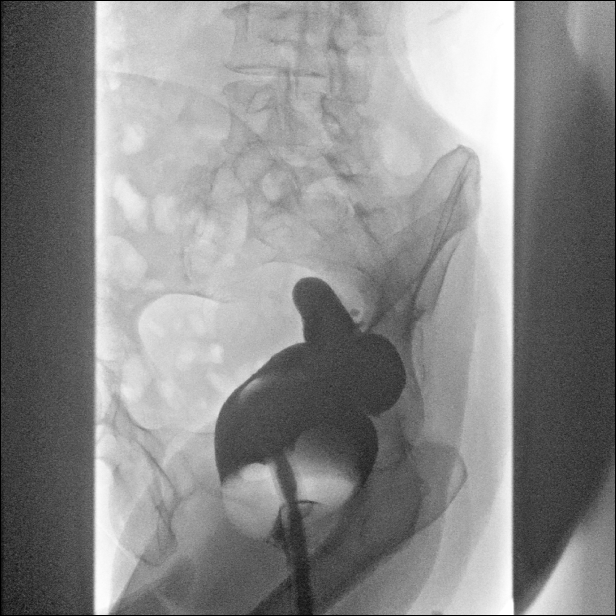
[im 3/5]
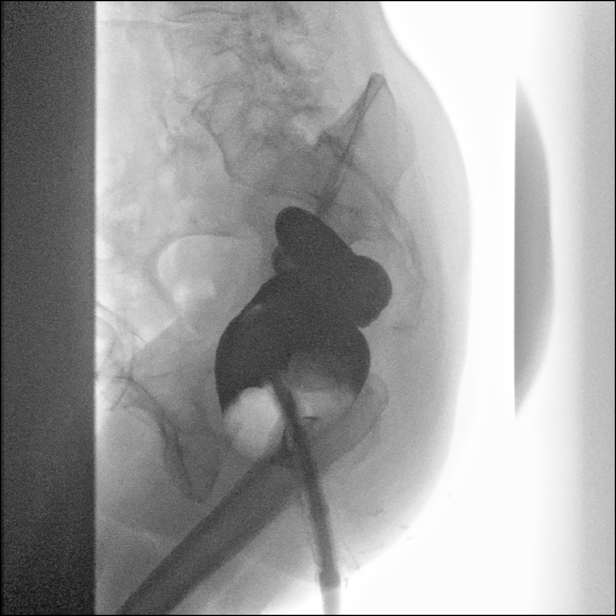
[im 4/5]
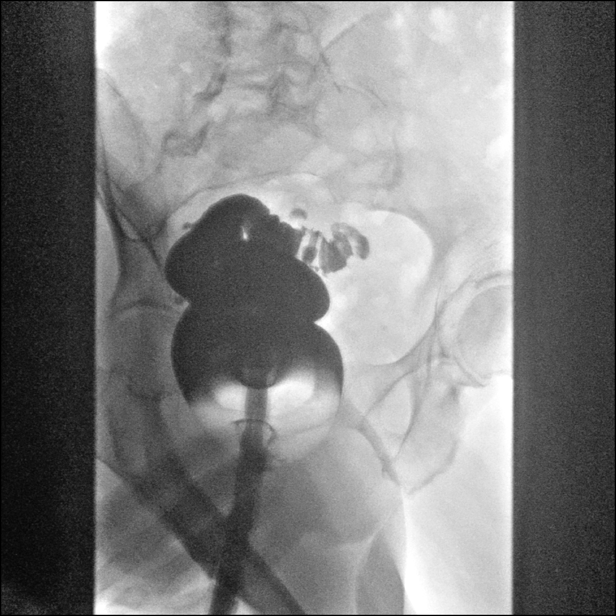
[im 5/5]
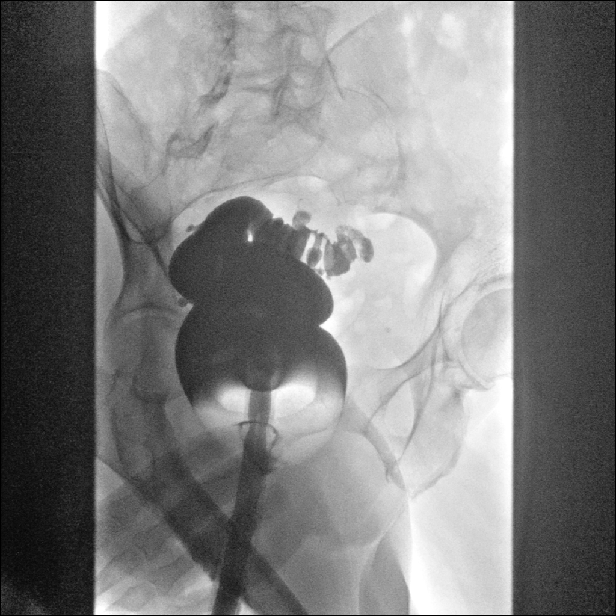

[11 of 11 positions shown; findings below may reference images not displayed]

FINDINGS: The scout radiograph shows a normal bowel gas pattern.

Contrast enema opacifies the rectum and a residual short segment of
distal sigmoid colon. Mild diverticulosis and muscular hypertrophy
is seen involving the residual portion of the distal sigmoid colon,
without signs of active diverticulitis. No evidence of contrast leak
or extravasation.
IMPRESSION: Kayy Turgoose with short segment of distal sigmoid colon shows mild
diverticulosis and muscular hypertrophy. No signs of active
diverticulitis.

## 2022-06-28 DIAGNOSIS — S46011D Strain of muscle(s) and tendon(s) of the rotator cuff of right shoulder, subsequent encounter: Secondary | ICD-10-CM | POA: Diagnosis not present

## 2022-06-28 DIAGNOSIS — M6281 Muscle weakness (generalized): Secondary | ICD-10-CM | POA: Diagnosis not present

## 2022-06-28 DIAGNOSIS — M25611 Stiffness of right shoulder, not elsewhere classified: Secondary | ICD-10-CM | POA: Diagnosis not present

## 2022-06-28 DIAGNOSIS — M7541 Impingement syndrome of right shoulder: Secondary | ICD-10-CM | POA: Diagnosis not present

## 2022-06-30 DIAGNOSIS — S46011D Strain of muscle(s) and tendon(s) of the rotator cuff of right shoulder, subsequent encounter: Secondary | ICD-10-CM | POA: Diagnosis not present

## 2022-06-30 DIAGNOSIS — M6281 Muscle weakness (generalized): Secondary | ICD-10-CM | POA: Diagnosis not present

## 2022-06-30 DIAGNOSIS — M25611 Stiffness of right shoulder, not elsewhere classified: Secondary | ICD-10-CM | POA: Diagnosis not present

## 2022-06-30 DIAGNOSIS — M7541 Impingement syndrome of right shoulder: Secondary | ICD-10-CM | POA: Diagnosis not present

## 2022-07-03 DIAGNOSIS — M7541 Impingement syndrome of right shoulder: Secondary | ICD-10-CM | POA: Diagnosis not present

## 2022-07-03 DIAGNOSIS — M6281 Muscle weakness (generalized): Secondary | ICD-10-CM | POA: Diagnosis not present

## 2022-07-03 DIAGNOSIS — M25611 Stiffness of right shoulder, not elsewhere classified: Secondary | ICD-10-CM | POA: Diagnosis not present

## 2022-07-03 DIAGNOSIS — S46011D Strain of muscle(s) and tendon(s) of the rotator cuff of right shoulder, subsequent encounter: Secondary | ICD-10-CM | POA: Diagnosis not present

## 2022-07-05 DIAGNOSIS — S46011D Strain of muscle(s) and tendon(s) of the rotator cuff of right shoulder, subsequent encounter: Secondary | ICD-10-CM | POA: Diagnosis not present

## 2022-07-05 DIAGNOSIS — M7541 Impingement syndrome of right shoulder: Secondary | ICD-10-CM | POA: Diagnosis not present

## 2022-07-05 DIAGNOSIS — M6281 Muscle weakness (generalized): Secondary | ICD-10-CM | POA: Diagnosis not present

## 2022-07-05 DIAGNOSIS — M25611 Stiffness of right shoulder, not elsewhere classified: Secondary | ICD-10-CM | POA: Diagnosis not present

## 2022-07-13 DIAGNOSIS — M25611 Stiffness of right shoulder, not elsewhere classified: Secondary | ICD-10-CM | POA: Diagnosis not present

## 2022-07-13 DIAGNOSIS — S46011D Strain of muscle(s) and tendon(s) of the rotator cuff of right shoulder, subsequent encounter: Secondary | ICD-10-CM | POA: Diagnosis not present

## 2022-07-13 DIAGNOSIS — M6281 Muscle weakness (generalized): Secondary | ICD-10-CM | POA: Diagnosis not present

## 2022-07-13 DIAGNOSIS — M7541 Impingement syndrome of right shoulder: Secondary | ICD-10-CM | POA: Diagnosis not present

## 2022-07-13 NOTE — Progress Notes (Signed)
PATIENT: Carla Ramirez DOB: [birth year]  REASON FOR VISIT: follow up for memory  HISTORY FROM: patient, alone PRIMARY NEUROLOGIST: Willis/Athar  HISTORY OF PRESENT ILLNESS: Today 07/17/22 MMSE 30/30 today. Feels memory impaired when under stress. Slight forgetfulness, go into room forget what she went for. Drives a car, no issues. She lives by a list and calendar. Involved in her church, her husband is encouraging her to take a step back. Takes 1/2 xanax every morning, anxiety is well managed. Getting ready to fly alone in [place]. Goes to exercise class 2 days a weeks, walks on off days.   Update 12/29/21 SS: Carla Ramirez is here today for follow-up.  Has not been seen since January 2022.  MMSE 30/30. Had perforated colon in January 2023. Has diverticulosis. Has colostomy. Was scheduled for reversal few weeks ago, got dehydrated from the prep, had syncopal episodes, the reversal was delayed to 8/17. Feels some brain fog. Anxiety. Hasn't given up anything due to memory. Does get distracted. She drives, manages the household. Involved in church ministries. 1 example, at car dealership, husband asked for "card" she didn't understand he meant credit card. Uses 1/2 tablet of Xanax daily. Her mother had LBD and parkinson's.   CT Head 12/16/21 IMPRESSION: 1. Right occipital scalp hematoma and laceration without underlying fracture. 2. No acute intracranial abnormality. 3. Mild white matter disease is mildly advanced for age. This likely reflects the sequela of chronic microvascular ischemia.  Update 06/28/2020 SS: Carla Ramirez is a 72 year old female with history of mild memory disturbance.  Her trouble with memory is exacerbated by anxiety.  She is not on any memory medications.  MMSE 30/30 today.  Over the last year, has not noted any changes to the memory.  At times, she may feel scattered, forget what she went into her room for.  She may have difficulty remembering people's names.  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  Has gone back to singing in the choir.  She has Xanax to take if needed for anxiety, rarely has to take.  She did take 1 this morning, she was anxious about the appointment.  She drives a car, does her own ADLs, manages her household.  Has reported some back issues, is seeing orthopedics.  She will be turning 70 in a few days. Does 2 days a week of exercise group.  Here today for evaluation unaccompanied.  HISTORY 06/26/2019 SS: Carla Ramirez is a 72 year old female with history of mild memory disturbance.  She lives with her husband, performs her own ADLs.  She drives a car without difficulty.  Her trouble with memory is exacerbated by anxiety.  She is not on any memory medication.  She feels her memory is stable, really only has difficulty during times of stress or anxiety.  Due to the pandemic, she really has not had much stress or anxiety lately, therefore her memory has been well.  She does report at times she has difficulty remembering people's names, or may lose her train of thought, but she thinks this may just be normal with age.  She does report that her mother had dementia.  She has Xanax she may take as needed for anxiety.  Overall, she has been doing well, no new problems or concerns.  She does take medication for blood pressure.  She has been doing a lot of reading, and walks regularly for exercise.  She  presents today for follow-up unaccompanied.    REVIEW OF SYSTEMS: Out of a complete 14 system review of symptoms, the patient complains only of the following symptoms, and all other reviewed systems are negative.  See HPI  ALLERGIES: Allergies  Allergen Reactions   Sulfa Antibiotics Swelling    Facial swelling    HOME MEDICATIONS: Outpatient Medications Prior to Visit  Medication Sig Dispense Refill   acetaminophen (TYLENOL) 650 MG CR tablet Take 650 mg by mouth at bedtime as needed for  pain.     ALPRAZolam (XANAX) 0.25 MG tablet Take 0.25 mg by mouth 2 (two) times daily as needed for anxiety.     amLODIPine-Valsartan-HCTZ 5-160-12.5 MG TABS Take 1 tablet by mouth daily.     atenolol (TENORMIN) 100 MG tablet Take 100 mg by mouth every evening.  7   Calcium-Cholecalciferol-Zinc (VIACTIV CALCIUM IMMUNE) 650-20-5.5 MG-MCG-MG CHEW Chew 1 tablet by mouth daily.     Cholecalciferol (VITAMIN D3) 50 MCG (2000 UT) capsule Take 2,000 Units by mouth daily.     Multiple Vitamins-Minerals (ZINC PO) Take 1 tablet by mouth daily.     nystatin-triamcinolone ointment (MYCOLOG) Apply 1 Application topically 2 (two) times daily. 30 g 1   polyethylene glycol powder (MIRALAX) 17 GM/SCOOP powder Take 17 g by mouth daily.     Propylene Glycol (SYSTANE BALANCE) 0.6 % SOLN Place 1 drop into both eyes daily as needed (dry eyes).     docusate sodium (COLACE) 100 MG capsule Take 100 mg by mouth 2 (two) times daily.     No facility-administered medications prior to visit.    PAST MEDICAL HISTORY: Past Medical History:  Diagnosis Date   Acid reflux    Anxiety    Arthritis    Diverticulosis    History of hiatal hernia    Hypertension    Iron deficiency anemia 03/19/2014   Leukopenia 03/19/2014   Melanoma ([facility]) 2018   On back.  Dr. Ubaldo Glassing follows.   Memory change 08/16/2015   Spinal stenosis     PAST SURGICAL HISTORY: Past Surgical History:  Procedure Laterality Date   APPENDECTOMY     COLONOSCOPY WITH ESOPHAGOGASTRODUODENOSCOPY (EGD)     COLOSTOMY     FLEXIBLE SIGMOIDOSCOPY N/A 01/12/2022   Procedure: FLEXIBLE SIGMOIDOSCOPY;  Surgeon: Ileana Roup, MD;  Location: [facility];  Service: General;  Laterality: N/A;   LAPAROTOMY N/A 06/27/2021   Procedure: EXPLORATORY LAPAROTOMY, SIGMOID COLECTOMY WITH COLOSTOMY;  Surgeon: Jovita Kussmaul, MD;  Location: [facility];  Service: General;  Laterality: N/A;   LYSIS OF ADHESION N/A 01/12/2022   Procedure: LYSIS OF ADHESION;  Surgeon: Ileana Roup, MD;  Location: [facility];  Service: General;  Laterality: N/A;   RENAL ANGIOGRAPHY Right 02/05/2019   Procedure: RENAL ANGIOGRAPHY;  Surgeon: Marty Heck, MD;  Location: [facility];  Service: Cardiovascular;  Laterality: Right;   XI ROBOTIC ASSISTED COLOSTOMY TAKEDOWN N/A 01/12/2022   Procedure: XI ROBOTIC ASSISTED COLOSTOMY TAKEDOWN AND INTRAOPERATIVE ASSESSMENT OF PERFUSION ICG;  Surgeon: Ileana Roup, MD;  Location: [facility];  Service: General;  Laterality: N/A;    FAMILY HISTORY: Family History  Problem Relation Age of Onset   Stroke Mother    Dementia Mother    Lymphoma Brother             SOCIAL HISTORY: Social History   Socioeconomic History   Marital status: Married    Spouse name: Not on file   Number of children: 3  Years of education: Masters   Highest education level: Not on file  Occupational History   Not on file  Tobacco Use   Smoking status: Never   Smokeless tobacco: Never  Vaping Use   Vaping Use: Never used  Substance and Sexual Activity   Alcohol use: Yes    Comment: 2 glasses of wine/month   Drug use: No   Sexual activity: Not Currently    Partners: Male    Birth control/protection: Post-menopausal  Other Topics Concern   Not on file  Social History Narrative   Married lives with hsuband at home.  Retired.  Has Masters degree.  Has 3 children.    Right-handed   Caffeine:   Social Determinants of Radio broadcast assistant Strain: Not on file  Food Insecurity: Not on file  Transportation Needs: Not on file  Physical Activity: Not on file  Stress: Not on file  Social Connections: Not on file  Intimate Partner Violence: Not on file   PHYSICAL EXAM  Vitals:   07/17/22 0951  BP: 136/73  Pulse: (!) 57  Weight: 137 lb (62.1 kg)  Height: 5' 2"$  (1.575 m)     Body mass index is 25.06 kg/m.  Generalized: Well developed, in no acute distress     07/17/2022    9:53 AM 12/29/2021    8:42 AM 06/28/2020    10:07 AM  MMSE - Mini Mental State Exam  Orientation to time 5 5 5  $ Orientation to Place 5 5 5  $ Registration 3 3 3  $ Attention/ Calculation 5 5 5  $ Recall 3 3 3  $ Language- name 2 objects 2 2 2  $ Language- repeat 1 1 1  $ Language- follow 3 step command 3 3 3  $ Language- read & follow direction 1 1 1  $ Write a sentence 1 1 1  $ Copy design 1 1 1  $ Total score 30 30 30   $ Neurological examination  Mentation: Alert oriented to time, place, history taking. Follows all commands speech and language fluent Cranial nerve II-XII: Pupils were equal round reactive to light. Extraocular movements were full, visual field were full on confrontational test. Facial sensation and strength were normal.  Head turning and shoulder shrug  were normal and symmetric. Motor: The motor testing reveals 5 over 5 strength of all 4 extremities. Good symmetric motor tone is noted throughout.  Sensory: Sensory testing is intact to soft touch on all 4 extremities. No evidence of extinction is noted.  Coordination: Cerebellar testing reveals good finger-nose-finger and heel-to-shin bilaterally.  Gait and station: Gait is normal.  Reflexes: Deep tendon reflexes are symmetric and normal bilaterally.   DIAGNOSTIC DATA (LABS, IMAGING, TESTING) - I reviewed patient records, labs, notes, testing and imaging myself where available.  Lab Results  Component Value Date   WBC 6.5 01/15/2022   HGB 10.9 (L) 01/15/2022   HCT 32.0 (L) 01/15/2022   MCV 93.8 01/15/2022   PLT 220 01/15/2022      Component Value Date/Time   NA 137 01/15/2022 0428   K 4.0 01/15/2022 0428   CL 103 01/15/2022 0428   CO2 29 01/15/2022 0428   GLUCOSE 96 01/15/2022 0428   BUN 19 01/15/2022 0428   CREATININE 0.71 01/15/2022 0428   CALCIUM 8.9 01/15/2022 0428   PROT 7.4 01/02/2022 1347   ALBUMIN 4.0 01/02/2022 1347   AST 18 01/02/2022 1347   ALT 13 01/02/2022 1347   ALKPHOS 91 01/02/2022 1347   BILITOT 0.6 01/02/2022 1347   GFRNONAA >  60 01/15/2022 0428    No results found for: "CHOL", "HDL", "LDLCALC", "LDLDIRECT", "TRIG", "CHOLHDL" Lab Results  Component Value Date   HGBA1C 5.5 12/07/2021   Lab Results  Component Value Date   [id number] 08/16/2015   No results found for: "TSH"  ASSESSMENT AND PLAN 72 y.o. year old female   1.  Mild memory disturbance 2.  Anxiety   -Remains overall stable, MMSE continues to be 30/30 -We discussed considering neuropsychological evaluation for further evaluation of her memory complaints, she has family history in her mother with LBD, PD, wants to hold off for now -Encouraged to discuss anxiety with PCP, may benefit from mood stabilizing medication daily such as Zoloft or Lexapro -Encouraged to stay active, exercise, eat healthy, drink plenty of water -Follow-up in 1 year or sooner if needed  Butler Denmark, AGNP-C, DNP 07/17/2022, 10:00 AM [facility] [address], [place], [place] [address] [phone]

## 2022-07-17 ENCOUNTER — Ambulatory Visit: Payer: Medicare PPO | Admitting: Neurology

## 2022-07-17 VITALS — BP 136/73 | HR 57 | Ht 62.0 in | Wt 137.0 lb

## 2022-07-17 DIAGNOSIS — F419 Anxiety disorder, unspecified: Secondary | ICD-10-CM | POA: Diagnosis not present

## 2022-07-17 DIAGNOSIS — R413 Other amnesia: Secondary | ICD-10-CM | POA: Diagnosis not present

## 2022-07-17 NOTE — Patient Instructions (Signed)
Discuss you anxiety, worry with your primary care doctor, would you benefit from daily mood stabilizing medication? We discussed neuropsychological testing Remain active, exercise, eat healthy, drink a lot of water See you back in 1 year

## 2022-07-18 DIAGNOSIS — M7541 Impingement syndrome of right shoulder: Secondary | ICD-10-CM | POA: Diagnosis not present

## 2022-07-18 DIAGNOSIS — M25611 Stiffness of right shoulder, not elsewhere classified: Secondary | ICD-10-CM | POA: Diagnosis not present

## 2022-07-18 DIAGNOSIS — M6281 Muscle weakness (generalized): Secondary | ICD-10-CM | POA: Diagnosis not present

## 2022-07-18 DIAGNOSIS — S46011D Strain of muscle(s) and tendon(s) of the rotator cuff of right shoulder, subsequent encounter: Secondary | ICD-10-CM | POA: Diagnosis not present

## 2022-07-20 ENCOUNTER — Ambulatory Visit: Payer: Medicare PPO | Admitting: Neurology

## 2022-07-25 DIAGNOSIS — S46011D Strain of muscle(s) and tendon(s) of the rotator cuff of right shoulder, subsequent encounter: Secondary | ICD-10-CM | POA: Diagnosis not present

## 2022-07-25 DIAGNOSIS — M6281 Muscle weakness (generalized): Secondary | ICD-10-CM | POA: Diagnosis not present

## 2022-07-25 DIAGNOSIS — M25611 Stiffness of right shoulder, not elsewhere classified: Secondary | ICD-10-CM | POA: Diagnosis not present

## 2022-07-25 DIAGNOSIS — M7541 Impingement syndrome of right shoulder: Secondary | ICD-10-CM | POA: Diagnosis not present

## 2022-07-28 DIAGNOSIS — M6281 Muscle weakness (generalized): Secondary | ICD-10-CM | POA: Diagnosis not present

## 2022-07-28 DIAGNOSIS — M7541 Impingement syndrome of right shoulder: Secondary | ICD-10-CM | POA: Diagnosis not present

## 2022-07-28 DIAGNOSIS — S46011D Strain of muscle(s) and tendon(s) of the rotator cuff of right shoulder, subsequent encounter: Secondary | ICD-10-CM | POA: Diagnosis not present

## 2022-07-28 DIAGNOSIS — M25611 Stiffness of right shoulder, not elsewhere classified: Secondary | ICD-10-CM | POA: Diagnosis not present

## 2022-08-01 DIAGNOSIS — M25611 Stiffness of right shoulder, not elsewhere classified: Secondary | ICD-10-CM | POA: Diagnosis not present

## 2022-08-01 DIAGNOSIS — M6281 Muscle weakness (generalized): Secondary | ICD-10-CM | POA: Diagnosis not present

## 2022-08-01 DIAGNOSIS — M7541 Impingement syndrome of right shoulder: Secondary | ICD-10-CM | POA: Diagnosis not present

## 2022-08-01 DIAGNOSIS — S46011D Strain of muscle(s) and tendon(s) of the rotator cuff of right shoulder, subsequent encounter: Secondary | ICD-10-CM | POA: Diagnosis not present

## 2022-08-04 DIAGNOSIS — M25611 Stiffness of right shoulder, not elsewhere classified: Secondary | ICD-10-CM | POA: Diagnosis not present

## 2022-08-04 DIAGNOSIS — M7541 Impingement syndrome of right shoulder: Secondary | ICD-10-CM | POA: Diagnosis not present

## 2022-08-04 DIAGNOSIS — M6281 Muscle weakness (generalized): Secondary | ICD-10-CM | POA: Diagnosis not present

## 2022-08-04 DIAGNOSIS — S46011D Strain of muscle(s) and tendon(s) of the rotator cuff of right shoulder, subsequent encounter: Secondary | ICD-10-CM | POA: Diagnosis not present

## 2022-08-07 DIAGNOSIS — M25611 Stiffness of right shoulder, not elsewhere classified: Secondary | ICD-10-CM | POA: Diagnosis not present

## 2022-08-07 DIAGNOSIS — M6281 Muscle weakness (generalized): Secondary | ICD-10-CM | POA: Diagnosis not present

## 2022-08-07 DIAGNOSIS — S46011D Strain of muscle(s) and tendon(s) of the rotator cuff of right shoulder, subsequent encounter: Secondary | ICD-10-CM | POA: Diagnosis not present

## 2022-08-07 DIAGNOSIS — M7541 Impingement syndrome of right shoulder: Secondary | ICD-10-CM | POA: Diagnosis not present

## 2022-08-16 DIAGNOSIS — M25611 Stiffness of right shoulder, not elsewhere classified: Secondary | ICD-10-CM | POA: Diagnosis not present

## 2022-08-16 DIAGNOSIS — S46011D Strain of muscle(s) and tendon(s) of the rotator cuff of right shoulder, subsequent encounter: Secondary | ICD-10-CM | POA: Diagnosis not present

## 2022-08-16 DIAGNOSIS — M6281 Muscle weakness (generalized): Secondary | ICD-10-CM | POA: Diagnosis not present

## 2022-08-16 DIAGNOSIS — M7541 Impingement syndrome of right shoulder: Secondary | ICD-10-CM | POA: Diagnosis not present

## 2022-08-30 DIAGNOSIS — S46011D Strain of muscle(s) and tendon(s) of the rotator cuff of right shoulder, subsequent encounter: Secondary | ICD-10-CM | POA: Diagnosis not present

## 2022-08-30 DIAGNOSIS — M6281 Muscle weakness (generalized): Secondary | ICD-10-CM | POA: Diagnosis not present

## 2022-08-30 DIAGNOSIS — M7541 Impingement syndrome of right shoulder: Secondary | ICD-10-CM | POA: Diagnosis not present

## 2022-08-30 DIAGNOSIS — M25611 Stiffness of right shoulder, not elsewhere classified: Secondary | ICD-10-CM | POA: Diagnosis not present

## 2022-09-04 DIAGNOSIS — M6281 Muscle weakness (generalized): Secondary | ICD-10-CM | POA: Diagnosis not present

## 2022-09-04 DIAGNOSIS — M7541 Impingement syndrome of right shoulder: Secondary | ICD-10-CM | POA: Diagnosis not present

## 2022-09-04 DIAGNOSIS — S46011D Strain of muscle(s) and tendon(s) of the rotator cuff of right shoulder, subsequent encounter: Secondary | ICD-10-CM | POA: Diagnosis not present

## 2022-09-04 DIAGNOSIS — M25611 Stiffness of right shoulder, not elsewhere classified: Secondary | ICD-10-CM | POA: Diagnosis not present

## 2022-09-06 DIAGNOSIS — M7541 Impingement syndrome of right shoulder: Secondary | ICD-10-CM | POA: Diagnosis not present

## 2022-09-06 DIAGNOSIS — S46011D Strain of muscle(s) and tendon(s) of the rotator cuff of right shoulder, subsequent encounter: Secondary | ICD-10-CM | POA: Diagnosis not present

## 2022-09-06 DIAGNOSIS — M25611 Stiffness of right shoulder, not elsewhere classified: Secondary | ICD-10-CM | POA: Diagnosis not present

## 2022-09-06 DIAGNOSIS — M6281 Muscle weakness (generalized): Secondary | ICD-10-CM | POA: Diagnosis not present

## 2022-09-18 DIAGNOSIS — Z1231 Encounter for screening mammogram for malignant neoplasm of breast: Secondary | ICD-10-CM | POA: Diagnosis not present

## 2022-09-20 DIAGNOSIS — H04123 Dry eye syndrome of bilateral lacrimal glands: Secondary | ICD-10-CM | POA: Diagnosis not present

## 2022-09-22 ENCOUNTER — Encounter: Payer: Self-pay | Admitting: Obstetrics and Gynecology

## 2022-10-11 DIAGNOSIS — D485 Neoplasm of uncertain behavior of skin: Secondary | ICD-10-CM | POA: Diagnosis not present

## 2022-10-11 DIAGNOSIS — L820 Inflamed seborrheic keratosis: Secondary | ICD-10-CM | POA: Diagnosis not present

## 2022-10-11 DIAGNOSIS — L821 Other seborrheic keratosis: Secondary | ICD-10-CM | POA: Diagnosis not present

## 2022-10-11 DIAGNOSIS — D2239 Melanocytic nevi of other parts of face: Secondary | ICD-10-CM | POA: Diagnosis not present

## 2022-10-11 DIAGNOSIS — Z85820 Personal history of malignant melanoma of skin: Secondary | ICD-10-CM | POA: Diagnosis not present

## 2022-10-11 DIAGNOSIS — D2272 Melanocytic nevi of left lower limb, including hip: Secondary | ICD-10-CM | POA: Diagnosis not present

## 2022-10-11 DIAGNOSIS — L570 Actinic keratosis: Secondary | ICD-10-CM | POA: Diagnosis not present

## 2022-10-11 DIAGNOSIS — Z85828 Personal history of other malignant neoplasm of skin: Secondary | ICD-10-CM | POA: Diagnosis not present

## 2022-10-11 DIAGNOSIS — L814 Other melanin hyperpigmentation: Secondary | ICD-10-CM | POA: Diagnosis not present

## 2022-10-11 DIAGNOSIS — D0362 Melanoma in situ of left upper limb, including shoulder: Secondary | ICD-10-CM | POA: Diagnosis not present

## 2022-11-08 DIAGNOSIS — Z85828 Personal history of other malignant neoplasm of skin: Secondary | ICD-10-CM | POA: Diagnosis not present

## 2022-11-08 DIAGNOSIS — D0362 Melanoma in situ of left upper limb, including shoulder: Secondary | ICD-10-CM | POA: Diagnosis not present

## 2022-12-14 DIAGNOSIS — F419 Anxiety disorder, unspecified: Secondary | ICD-10-CM | POA: Diagnosis not present

## 2022-12-14 DIAGNOSIS — F330 Major depressive disorder, recurrent, mild: Secondary | ICD-10-CM | POA: Diagnosis not present

## 2023-01-22 DIAGNOSIS — Z85828 Personal history of other malignant neoplasm of skin: Secondary | ICD-10-CM | POA: Diagnosis not present

## 2023-01-22 DIAGNOSIS — D0362 Melanoma in situ of left upper limb, including shoulder: Secondary | ICD-10-CM | POA: Diagnosis not present

## 2023-01-22 DIAGNOSIS — L988 Other specified disorders of the skin and subcutaneous tissue: Secondary | ICD-10-CM | POA: Diagnosis not present

## 2023-02-05 DIAGNOSIS — K219 Gastro-esophageal reflux disease without esophagitis: Secondary | ICD-10-CM | POA: Diagnosis not present

## 2023-02-05 DIAGNOSIS — F419 Anxiety disorder, unspecified: Secondary | ICD-10-CM | POA: Diagnosis not present

## 2023-02-07 DIAGNOSIS — F419 Anxiety disorder, unspecified: Secondary | ICD-10-CM | POA: Diagnosis not present

## 2023-02-13 DIAGNOSIS — F419 Anxiety disorder, unspecified: Secondary | ICD-10-CM | POA: Diagnosis not present

## 2023-02-15 DIAGNOSIS — H04123 Dry eye syndrome of bilateral lacrimal glands: Secondary | ICD-10-CM | POA: Diagnosis not present

## 2023-02-27 DIAGNOSIS — F419 Anxiety disorder, unspecified: Secondary | ICD-10-CM | POA: Diagnosis not present

## 2023-03-06 DIAGNOSIS — F419 Anxiety disorder, unspecified: Secondary | ICD-10-CM | POA: Diagnosis not present

## 2023-03-20 DIAGNOSIS — F419 Anxiety disorder, unspecified: Secondary | ICD-10-CM | POA: Diagnosis not present

## 2023-03-27 DIAGNOSIS — F411 Generalized anxiety disorder: Secondary | ICD-10-CM | POA: Diagnosis not present

## 2023-04-03 DIAGNOSIS — F411 Generalized anxiety disorder: Secondary | ICD-10-CM | POA: Diagnosis not present

## 2023-04-16 DIAGNOSIS — H35372 Puckering of macula, left eye: Secondary | ICD-10-CM | POA: Diagnosis not present

## 2023-04-17 DIAGNOSIS — F411 Generalized anxiety disorder: Secondary | ICD-10-CM | POA: Diagnosis not present

## 2023-05-01 DIAGNOSIS — F411 Generalized anxiety disorder: Secondary | ICD-10-CM | POA: Diagnosis not present

## 2023-05-09 DIAGNOSIS — F411 Generalized anxiety disorder: Secondary | ICD-10-CM | POA: Diagnosis not present

## 2023-05-30 DIAGNOSIS — M8580 Other specified disorders of bone density and structure, unspecified site: Secondary | ICD-10-CM

## 2023-05-30 HISTORY — DX: Other specified disorders of bone density and structure, unspecified site: M85.80

## 2023-06-06 DIAGNOSIS — F411 Generalized anxiety disorder: Secondary | ICD-10-CM | POA: Diagnosis not present

## 2023-06-07 DIAGNOSIS — D485 Neoplasm of uncertain behavior of skin: Secondary | ICD-10-CM | POA: Diagnosis not present

## 2023-06-07 DIAGNOSIS — L720 Epidermal cyst: Secondary | ICD-10-CM | POA: Diagnosis not present

## 2023-06-07 DIAGNOSIS — Z85820 Personal history of malignant melanoma of skin: Secondary | ICD-10-CM | POA: Diagnosis not present

## 2023-06-07 DIAGNOSIS — Z85828 Personal history of other malignant neoplasm of skin: Secondary | ICD-10-CM | POA: Diagnosis not present

## 2023-06-07 DIAGNOSIS — D2261 Melanocytic nevi of right upper limb, including shoulder: Secondary | ICD-10-CM | POA: Diagnosis not present

## 2023-06-07 DIAGNOSIS — D1801 Hemangioma of skin and subcutaneous tissue: Secondary | ICD-10-CM | POA: Diagnosis not present

## 2023-06-07 DIAGNOSIS — L821 Other seborrheic keratosis: Secondary | ICD-10-CM | POA: Diagnosis not present

## 2023-06-07 DIAGNOSIS — L814 Other melanin hyperpigmentation: Secondary | ICD-10-CM | POA: Diagnosis not present

## 2023-06-20 DIAGNOSIS — F411 Generalized anxiety disorder: Secondary | ICD-10-CM | POA: Diagnosis not present

## 2023-06-28 DIAGNOSIS — E559 Vitamin D deficiency, unspecified: Secondary | ICD-10-CM | POA: Diagnosis not present

## 2023-06-28 DIAGNOSIS — Z79899 Other long term (current) drug therapy: Secondary | ICD-10-CM | POA: Diagnosis not present

## 2023-06-28 DIAGNOSIS — C439 Malignant melanoma of skin, unspecified: Secondary | ICD-10-CM | POA: Diagnosis not present

## 2023-06-28 DIAGNOSIS — F331 Major depressive disorder, recurrent, moderate: Secondary | ICD-10-CM | POA: Diagnosis not present

## 2023-06-28 DIAGNOSIS — Z0000 Encounter for general adult medical examination without abnormal findings: Secondary | ICD-10-CM | POA: Diagnosis not present

## 2023-06-28 DIAGNOSIS — I10 Essential (primary) hypertension: Secondary | ICD-10-CM | POA: Diagnosis not present

## 2023-07-04 ENCOUNTER — Telehealth: Payer: Self-pay | Admitting: Neurology

## 2023-07-04 DIAGNOSIS — F411 Generalized anxiety disorder: Secondary | ICD-10-CM | POA: Diagnosis not present

## 2023-07-04 NOTE — Telephone Encounter (Signed)
 I received labs from primary care collected 06/28/2023, WBC 6.3, Hgb 12.8, platelet 253, glucose 105, creatinine 0.92, AST 17, ALT 13, TSH 1.84, vitamin D58.7, cholesterol 210, LDL 110.

## 2023-07-11 NOTE — Progress Notes (Deleted)
 73 y.o. G75P2003 Married Caucasian female here for a breast and pelvic exam.    The patient is also followed for ***.  PCP: Mila Palmer, MD   Patient's last menstrual period was 05/30/2003.           Sexually active: No.  The current method of family planning is post menopausal status.    Menopausal hormone therapy:  n/a Exercising: {yes no:314532}  {types:19826} Smoker:  no  OB History     Gravida  2   Para  2   Term  2   Preterm  0   AB  0   Living  3      SAB  0   IAB  0   Ectopic  0   Multiple  1   Live Births  3           HEALTH MAINTENANCE: Last 2 paps: 06/23/21 neg,  12-10-17 Neg:Neg HR HPV  History of abnormal Pap or positive HPV:  no Mammogram:  09/18/22 Breast Density Cat B, BI-RADS CAT 1 neg Colonoscopy:  2021 per pt Bone Density:  07/28/20  Result  osteopenia   Immunization History  Administered Date(s) Administered   Fluad Quad(high Dose 65+) 02/17/2019   Influenza Split 02/11/2008, 01/25/2011, 02/22/2012, 02/20/2013, 02/18/2014, 02/14/2018   Influenza,inj,Quad PF,6+ Mos 02/28/2016   Influenza,inj,quad, With Preservative 02/25/2015   Influenza-Unspecified 02/10/2014, 01/28/2019   PFIZER(Purple Top)SARS-COV-2 Vaccination 06/18/2019, 07/09/2019, 03/07/2020   Pneumococcal Conjugate-13 02/28/2016   Pneumococcal Polysaccharide-23 04/16/2018   Td 04/02/2018   Tdap 04/10/2008, 06/29/2009, 12/16/2021   Zoster Recombinant(Shingrix) 10/15/2016   Zoster, Live 07/28/2010, 02/03/2011, 12/28/2016      reports that she has never smoked. She has never used smokeless tobacco. She reports current alcohol use. She reports that she does not use drugs.  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  Dr. Nicholas Lose follows.   Memory change 08/16/2015   Spinal  stenosis     Past Surgical History:  Procedure Laterality Date   APPENDECTOMY     COLONOSCOPY WITH ESOPHAGOGASTRODUODENOSCOPY (EGD)     COLOSTOMY     FLEXIBLE SIGMOIDOSCOPY N/A 01/12/2022   Procedure: FLEXIBLE SIGMOIDOSCOPY;  Surgeon: Andria Meuse, MD;  Location: [facility];  Service: General;  Laterality: N/A;   LAPAROTOMY N/A 06/27/2021   Procedure: EXPLORATORY LAPAROTOMY, SIGMOID COLECTOMY WITH COLOSTOMY;  Surgeon: Griselda Miner, MD;  Location: [facility];  Service: General;  Laterality: N/A;   LYSIS OF ADHESION N/A 01/12/2022   Procedure: LYSIS OF ADHESION;  Surgeon: Andria Meuse, MD;  Location: [facility];  Service: General;  Laterality: N/A;   RENAL ANGIOGRAPHY Right 02/05/2019   Procedure: RENAL ANGIOGRAPHY;  Surgeon: Cephus Shelling, MD;  Location: [facility];  Service: Cardiovascular;  Laterality: Right;   XI ROBOTIC ASSISTED COLOSTOMY TAKEDOWN N/A 01/12/2022   Procedure: XI ROBOTIC ASSISTED COLOSTOMY TAKEDOWN AND INTRAOPERATIVE ASSESSMENT OF PERFUSION ICG;  Surgeon: Andria Meuse, MD;  Location: [facility];  Service: General;  Laterality: N/A;    Current Outpatient Medications  Medication Sig Dispense Refill   acetaminophen (TYLENOL) 650 MG CR tablet Take 650 mg by mouth at bedtime as needed for pain.     ALPRAZolam (XANAX) 0.25 MG tablet Take  0.25 mg by mouth 2 (two) times daily as needed for anxiety.     amLODIPine-Valsartan-HCTZ 5-160-12.5 MG TABS Take 1 tablet by mouth daily.     atenolol (TENORMIN) 100 MG tablet Take 100 mg by mouth every evening.  7   Calcium-Cholecalciferol-Zinc (VIACTIV CALCIUM IMMUNE) 650-20-5.5 MG-MCG-MG CHEW Chew 1 tablet by mouth daily.     Cholecalciferol (VITAMIN D3) 50 MCG (2000 UT) capsule Take 2,000 Units by mouth daily.     Multiple Vitamins-Minerals (ZINC PO) Take 1 tablet by mouth daily.     nystatin-triamcinolone ointment (MYCOLOG) Apply 1 Application topically 2 (two) times daily. 30 g 1   polyethylene glycol powder  (MIRALAX) 17 GM/SCOOP powder Take 17 g by mouth daily.     Propylene Glycol (SYSTANE BALANCE) 0.6 % SOLN Place 1 drop into both eyes daily as needed (dry eyes).     No current facility-administered medications for this visit.    ALLERGIES: Sulfa antibiotics  Family History  Problem Relation Age of Onset   Stroke Mother    Dementia Mother    Lymphoma Brother             Review of Systems  PHYSICAL EXAM:  LMP 05/30/2003     General appearance: alert, cooperative and appears stated age Head: normocephalic, without obvious abnormality, atraumatic Neck: no adenopathy, supple, symmetrical, trachea midline and thyroid normal to inspection and palpation Lungs: clear to auscultation bilaterally Breasts: normal appearance, no masses or tenderness, No nipple retraction or dimpling, No nipple discharge or bleeding, No axillary adenopathy Heart: regular rate and rhythm Abdomen: soft, non-tender; no masses, no organomegaly Extremities: extremities normal, atraumatic, no cyanosis or edema Skin: skin color, texture, turgor normal. No rashes or lesions Lymph nodes: cervical, supraclavicular, and axillary nodes normal. Neurologic: grossly normal  Pelvic: External genitalia:  no lesions              No abnormal inguinal nodes palpated.              Urethra:  normal appearing urethra with no masses, tenderness or lesions              Bartholins and Skenes: normal                 Vagina: normal appearing vagina with normal color and discharge, no lesions              Cervix: no lesions              Pap taken: {yes no:314532} Bimanual Exam:  Uterus:  normal size, contour, position, consistency, mobility, non-tender              Adnexa: no mass, fullness, tenderness              Rectal exam: {yes no:314532}.  Confirms.              Anus:  normal sphincter tone, no lesions  Chaperone was present for exam:  {BSCHAPERONE:31226::"Gottlieb Zuercher F, CMA"}  ASSESSMENT: Encounter for breast and pelvic exam.    ***  PLAN: Mammogram screening discussed. Self breast awareness reviewed. Pap and HRV collected:  {yes no:314532} Guidelines for Calcium, Vitamin D, regular exercise program including cardiovascular and weight bearing exercise. Medication refills:  *** {LABS (Optional):23779} Follow up:  ***    Additional counseling given.  {yes [id number]. ***  total time was spent for this patient encounter, including preparation, face-to-face counseling with the patient, coordination of care, and documentation of the encounter in addition to  doing the breast and pelvic exam.

## 2023-07-17 ENCOUNTER — Telehealth: Payer: Self-pay | Admitting: Neurology

## 2023-07-17 NOTE — Telephone Encounter (Signed)
Pt called to verify if [facility] will be open Wednesday, 07/18/23

## 2023-07-17 NOTE — Telephone Encounter (Signed)
LVM and sent mychart msg informing pt of r/s needed for 2/19- office closing d/t inclement weather.

## 2023-07-18 ENCOUNTER — Ambulatory Visit: Payer: Medicare PPO | Admitting: Neurology

## 2023-07-19 DIAGNOSIS — Z85828 Personal history of other malignant neoplasm of skin: Secondary | ICD-10-CM | POA: Diagnosis not present

## 2023-07-19 DIAGNOSIS — D485 Neoplasm of uncertain behavior of skin: Secondary | ICD-10-CM | POA: Diagnosis not present

## 2023-07-19 DIAGNOSIS — D4819 Other specified neoplasm of uncertain behavior of connective and other soft tissue: Secondary | ICD-10-CM | POA: Diagnosis not present

## 2023-07-25 ENCOUNTER — Encounter: Payer: Medicare PPO | Admitting: Obstetrics and Gynecology

## 2023-07-30 ENCOUNTER — Ambulatory Visit (INDEPENDENT_AMBULATORY_CARE_PROVIDER_SITE_OTHER): Payer: Medicare PPO | Admitting: Obstetrics and Gynecology

## 2023-07-30 ENCOUNTER — Other Ambulatory Visit (HOSPITAL_COMMUNITY)
Admission: RE | Admit: 2023-07-30 | Discharge: 2023-07-30 | Disposition: A | Discharge status: Home / Self Care | Source: Ambulatory Visit | Attending: Obstetrics and Gynecology | Admitting: Obstetrics and Gynecology

## 2023-07-30 ENCOUNTER — Telehealth: Payer: Self-pay | Admitting: Obstetrics and Gynecology

## 2023-07-30 ENCOUNTER — Encounter: Payer: Self-pay | Admitting: Obstetrics and Gynecology

## 2023-07-30 VITALS — BP 112/72 | HR 57 | Ht 61.75 in | Wt 144.0 lb

## 2023-07-30 DIAGNOSIS — M8580 Other specified disorders of bone density and structure, unspecified site: Secondary | ICD-10-CM | POA: Diagnosis not present

## 2023-07-30 DIAGNOSIS — Z01419 Encounter for gynecological examination (general) (routine) without abnormal findings: Secondary | ICD-10-CM | POA: Diagnosis not present

## 2023-07-30 DIAGNOSIS — R194 Change in bowel habit: Secondary | ICD-10-CM

## 2023-07-30 DIAGNOSIS — N3946 Mixed incontinence: Secondary | ICD-10-CM | POA: Diagnosis not present

## 2023-07-30 DIAGNOSIS — Z124 Encounter for screening for malignant neoplasm of cervix: Secondary | ICD-10-CM | POA: Insufficient documentation

## 2023-07-30 NOTE — Progress Notes (Unsigned)
 73 y.o. G46P2003 Married Caucasian female here for a breast and pelvic exam.  Pt does want to discuss gas and soft stool.  Several BMs daily.  Some bloating with eating meals.  Tums helps this.   Hx perforated colon with colotomy in past.   The patient is also followed for osteopenia.   Wearing a pad all the time for urinary incontinence.  Leaked once with sneeze. No leak with exercise.  Some occasional urgency.  Can go all night without needing to void.  May get up once a night.  Cannot always get to the bathroom on time.  Had some memory issues and anxiety.  Tried Zoloft and did not tolerate this well, so stopped it.  Has done some counseling.  Sees neurology yearly.   Desires a pap today.  Has a new grandbaby.   PCP: Mila Palmer, MD   Patient's last menstrual period was 05/30/2003.           Sexually active: No.  The current method of family planning is post menopausal status.    Menopausal hormone therapy:  n/a Exercising: Yes.     Cardio/strength training 2x a week, walking Smoker:  no  OB History     Gravida  2   Para  2   Term  2   Preterm  0   AB  0   Living  3      SAB  0   IAB  0   Ectopic  0   Multiple  1   Live Births  3           HEALTH MAINTENANCE: Last 2 paps: 06/23/21 neg, 12/10/17 neg: HR HPV neg History of abnormal Pap or positive HPV:  no Mammogram:  09/18/22 Breast Density Cat B, BI-RADS CAT 1 neg Colonoscopy:  2021 per pt Bone Density:  07/28/20  Result  osteopenia   Immunization History  Administered Date(s) Administered   Fluad Quad(high Dose 65+) 02/17/2019   Influenza Split 02/11/2008, 01/25/2011, 02/22/2012, 02/20/2013, 02/18/2014, 02/14/2018   Influenza,inj,Quad PF,6+ Mos 02/28/2016   Influenza,inj,quad, With Preservative 02/25/2015   Influenza-Unspecified 02/10/2014, 01/28/2019   PFIZER(Purple Top)SARS-COV-2 Vaccination 06/18/2019, 07/09/2019, 03/07/2020   Pneumococcal Conjugate-13 02/28/2016    Pneumococcal Polysaccharide-23 04/16/2018   Td 04/02/2018   Tdap 04/10/2008, 06/29/2009, 12/16/2021   Zoster Recombinant(Shingrix) 10/15/2016   Zoster, Live 07/28/2010, 02/03/2011, 12/28/2016      reports that she has never smoked. She has never used smokeless tobacco. She reports current alcohol use. She reports that she does not use drugs.  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  Dr. Nicholas Lose follows.   Memory change 08/16/2015   Spinal stenosis     Past Surgical History:  Procedure Laterality Date   APPENDECTOMY     COLONOSCOPY WITH ESOPHAGOGASTRODUODENOSCOPY (EGD)     COLOSTOMY     FLEXIBLE SIGMOIDOSCOPY N/A 01/12/2022   Procedure: FLEXIBLE SIGMOIDOSCOPY;  Surgeon: Andria Meuse, MD;  Location: [facility];  Service: General;  Laterality: N/A;   LAPAROTOMY N/A 06/27/2021   Procedure: EXPLORATORY LAPAROTOMY, SIGMOID COLECTOMY WITH COLOSTOMY;  Surgeon: Griselda Miner, MD;  Location: [facility];  Service: General;  Laterality: N/A;   LYSIS OF ADHESION N/A 01/12/2022   Procedure:  LYSIS OF ADHESION;  Surgeon: Andria Meuse, MD;  Location: [facility];  Service: General;  Laterality: N/A;   RENAL ANGIOGRAPHY Right 02/05/2019   Procedure: RENAL ANGIOGRAPHY;  Surgeon: Cephus Shelling, MD;  Location: [facility];  Service: Cardiovascular;  Laterality: Right;   XI ROBOTIC ASSISTED COLOSTOMY TAKEDOWN N/A 01/12/2022   Procedure: XI ROBOTIC ASSISTED COLOSTOMY TAKEDOWN AND INTRAOPERATIVE ASSESSMENT OF PERFUSION ICG;  Surgeon: Andria Meuse, MD;  Location: [facility];  Service: General;  Laterality: N/A;    Current Outpatient Medications  Medication Sig Dispense Refill   acetaminophen (TYLENOL) 650 MG CR tablet Take 650 mg by mouth at bedtime as needed for pain.     ALPRAZolam (XANAX)  0.25 MG tablet Take 0.25 mg by mouth 2 (two) times daily as needed for anxiety.     amLODIPine-Valsartan-HCTZ 5-160-12.5 MG TABS Take 1 tablet by mouth daily.     atenolol (TENORMIN) 100 MG tablet Take 100 mg by mouth every evening.  7   Calcium-Cholecalciferol-Zinc (VIACTIV CALCIUM IMMUNE) 650-20-5.5 MG-MCG-MG CHEW Chew 1 tablet by mouth daily.     Cholecalciferol (VITAMIN D3) 50 MCG (2000 UT) capsule Take 2,000 Units by mouth daily.     mupirocin ointment (BACTROBAN) 2 % SMARTSIG:sparingly Topical Daily     pantoprazole (PROTONIX) 40 MG tablet Take 40 mg by mouth daily.     Propylene Glycol (SYSTANE BALANCE) 0.6 % SOLN Place 1 drop into both eyes daily as needed (dry eyes).     No current facility-administered medications for this visit.    ALLERGIES: Sulfa antibiotics  Family History  Problem Relation Age of Onset   Stroke Mother    Dementia Mother    Lymphoma Brother             Review of Systems  All other systems reviewed and are negative.   PHYSICAL EXAM:  BP 112/72 (BP Location: Left Arm, Patient Position: Sitting, Cuff Size: Normal)   Pulse (!) 57   Ht 5' 1.75" (1.568 m)   Wt 144 lb (65.3 kg)   LMP 05/30/2003   SpO2 96%   BMI 26.55 kg/m     General appearance: alert, cooperative and appears stated age Head: normocephalic, without obvious abnormality, atraumatic Neck: no adenopathy, supple, symmetrical, trachea midline and thyroid normal to inspection and palpation Lungs: clear to auscultation bilaterally Breasts: normal appearance, no masses or tenderness, No nipple retraction or dimpling, No nipple discharge or bleeding, No axillary adenopathy Heart: regular rate and rhythm Abdomen: soft, non-tender; no masses, no organomegaly Extremities: extremities normal, atraumatic, no cyanosis or edema Skin: skin color, texture, turgor normal. No rashes or lesions Lymph nodes: cervical, supraclavicular, and axillary nodes normal. Neurologic: grossly normal  Pelvic:  External genitalia:  no lesions              No abnormal inguinal nodes palpated.              Urethra:  normal appearing urethra with no masses, tenderness or lesions              Bartholins and Skenes: normal                 Vagina: normal appearing vagina with normal color and discharge, no lesions              Cervix: no lesions              Pap taken: Yes.   Bimanual Exam:  Uterus:  normal size, contour,  position, consistency, mobility, non-tender              Adnexa: no mass, fullness, tenderness              Rectal exam: Yes.  .  Confirms.              Anus:  normal sphincter tone, no lesions  Chaperone was present for exam:  Warren Lacy, CMA  ASSESSMENT: Encounter for breast and pelvic exam.  Cervical cancer screening.  Osteopenia. Menopausal female. Mixed incontinence.  Frequent bowel movements.  PLAN: Mammogram screening discussed. Self breast awareness reviewed. Pap and reflex HRV collected:  yes Guidelines for Calcium, Vitamin D, regular exercise program including cardiovascular and weight bearing exercise. Medication refills:  NA BMD at [facility].  Referral for pelvic floor therapy. She will follow up with her PCP or her GI regarding her bowel function change.  Follow up:  2 years and prn.     Additional counseling given.  Yes.  . 25 min  total time was spent for this patient encounter, including preparation, face-to-face counseling with the patient, coordination of care, and documentation of the encounter in addition to doing the breast and pelvic exam and pap collection.

## 2023-07-30 NOTE — Telephone Encounter (Signed)
 Please make a referral to [facility] for mixed incontinence.

## 2023-07-30 NOTE — Patient Instructions (Signed)

## 2023-07-30 NOTE — Telephone Encounter (Signed)
 Referral sent. To be f/u'ed on via work queue. Encounter closed.

## 2023-08-01 ENCOUNTER — Encounter: Payer: Self-pay | Admitting: Obstetrics and Gynecology

## 2023-08-01 DIAGNOSIS — F411 Generalized anxiety disorder: Secondary | ICD-10-CM | POA: Diagnosis not present

## 2023-08-01 LAB — CYTOLOGY - PAP: Diagnosis: NEGATIVE

## 2023-08-09 DIAGNOSIS — K9289 Other specified diseases of the digestive system: Secondary | ICD-10-CM | POA: Diagnosis not present

## 2023-08-09 DIAGNOSIS — K219 Gastro-esophageal reflux disease without esophagitis: Secondary | ICD-10-CM | POA: Diagnosis not present

## 2023-08-09 DIAGNOSIS — R194 Change in bowel habit: Secondary | ICD-10-CM | POA: Diagnosis not present

## 2023-08-15 DIAGNOSIS — F411 Generalized anxiety disorder: Secondary | ICD-10-CM | POA: Diagnosis not present

## 2023-08-29 DIAGNOSIS — F411 Generalized anxiety disorder: Secondary | ICD-10-CM | POA: Diagnosis not present

## 2023-09-06 NOTE — Therapy (Unsigned)
 OUTPATIENT PHYSICAL THERAPY FEMALE PELVIC EVALUATION   Patient Name: Carla Ramirez MRN: [id number] DOB:10-06-1950, 73 y.o., female Today's Date: 09/07/2023  END OF SESSION:  PT End of Session - 09/07/23 1102     Visit Number 1    Date for PT Re-Evaluation 11/30/23    Authorization Type [employer]    PT Start Time 0110    PT Stop Time 1140    PT Time Calculation (min) 630 min    Activity Tolerance Patient tolerated treatment well    Behavior During Therapy 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  Dr. Nicholas Lose follows.   Memory change 08/16/2015   Spinal stenosis    Past Surgical History:  Procedure Laterality Date   APPENDECTOMY     COLONOSCOPY WITH ESOPHAGOGASTRODUODENOSCOPY (EGD)     COLOSTOMY     FLEXIBLE SIGMOIDOSCOPY N/A 01/12/2022   Procedure: FLEXIBLE SIGMOIDOSCOPY;  Surgeon: Andria Meuse, MD;  Location: [facility];  Service: General;  Laterality: N/A;   LAPAROTOMY N/A 06/27/2021   Procedure: EXPLORATORY LAPAROTOMY, SIGMOID COLECTOMY WITH COLOSTOMY;  Surgeon: Griselda Miner, MD;  Location: [facility];  Service: General;  Laterality: N/A;   LYSIS OF ADHESION N/A 01/12/2022   Procedure: LYSIS OF ADHESION;  Surgeon: Andria Meuse, MD;  Location: [facility];  Service: General;  Laterality: N/A;   RENAL ANGIOGRAPHY Right 02/05/2019   Procedure: RENAL ANGIOGRAPHY;  Surgeon: Cephus Shelling, MD;  [facility]: [facility];  Service: Cardiovascular;  Laterality: Right;   XI ROBOTIC ASSISTED COLOSTOMY TAKEDOWN N/A 01/12/2022   Procedure: XI ROBOTIC ASSISTED COLOSTOMY TAKEDOWN AND INTRAOPERATIVE ASSESSMENT OF PERFUSION ICG;  Surgeon: Andria Meuse, MD;  Location: [facility];  Service: General;  Laterality: N/A;   Patient Active  Problem List   Diagnosis Date Noted   Anxiety 07/17/2022   S/P colostomy takedown 01/12/2022   Pneumoperitoneum 06/27/2021   Perforation of sigmoid colon due to diverticulitis 06/27/2021   Renal artery stenosis (HCC) 03/28/2019   Memory change 08/16/2015   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  Fluid intake: water  PAIN:  Are you having pain? No  PRECAUTIONS: Other: Melanoma  RED FLAGS: None   WEIGHT BEARING RESTRICTIONS: No  FALLS:  Has patient fallen in last 6 months? No  OCCUPATION: retired  ACTIVITY LEVEL : exercise class 2 days per week, walks, go to the [employer]  PLOF: Independent  PATIENT GOALS: reduce leakage to not wear pads  PERTINENT HISTORY:  Appendectomy; exploratory laparotomy sigmoid colectomy with colostomy 06/27/2021; robotic assisted colostomy takedown 01/12/2022; Melanoma; Memory change Sexual abuse: No  BOWEL MOVEMENT:  Pain with bowel movement: No  URINATION: Pain with urination: No Fully empty bladder: Yes:   Stream: Strong Urgency: Yes , sometimes Frequency: average Leakage: Urge to void, Walking to the bathroom, and Sneezing Pads: Yes: when at home she will wear a pad and it happens more at the house. 2-3 pads and pads  are damp  INTERCOURSE: not active   PREGNANCY: Vaginal deliveries 2, 1 child and then set of twins   PROLAPSE: None   OBJECTIVE:  Note: Objective measures were completed at Evaluation unless otherwise noted.  DIAGNOSTIC FINDINGS:  none  PATIENT SURVEYS:  PFIQ-7: 6 UIQ-7: 10 COGNITION: Overall cognitive status: Within functional limits for tasks assessed     SENSATION: Light touch: Appears intact    LUMBARAROM/PROM: lumbar ROM decreased by 25%   LOWER EXTREMITY ROM:  Passive ROM Right eval Left eval  Hip external rotation 60 60   (Blank rows = not tested)  LOWER EXTREMITY MMT:  MMT Right eval Left eval  Hip abduction 4/5 4/5   (Blank rows = not tested) PALPATION:   Abdominal: lower abdominal scar has restrictions, difficulty with contracting the lower abdominals, difficulty with performing diaphragmatic breathing.                Patient confirms identification and approves PT to assess internal pelvic floor and treatment No  PELVIC MMT: Patient is not comfortable with internal muscle assessment so therapist did the  RUSI : the ultrasound with gel above the pubic bone on pelvic floor program: Patient will move bladder down 7 mm and not able to contract the pelvic floor. She is not able to move the pelvic floor upward and will bulge her abdomen putting pressure on the bladder   MMT eval  Vaginal   (Blank rows = not tested)         TODAY'S TREATMENT:                                                                                                                              DATE: 09/07/23  EVAL Examination completed, findings reviewed, pt educated on POC, HEP, and female pelvic floor anatomy, reasoning with pelvic floor assessment internally with pt consent,. Pt motivated to participate in PT and agreeable to attempt recommendations.     PATIENT EDUCATION:  09/07/23 Education details: Access Code: [email or web address] Person educated:  Patient Education method:  Explanation, Demonstration, Tactile cues, Verbal cues, and Handouts Education comprehension: verbalized understanding, returned demonstration, verbal cues required, tactile cues required, and needs further education  HOME EXERCISE PROGRAM: 09/07/23 Access Code: [email or web address] URL: [email or web address] Date: 09/07/2023 Prepared by: Eulis Foster  Exercises - Hooklying Isometric Hip Flexion  - 2 x daily - 7 x weekly - 1 sets - 10 reps  ASSESSMENT:  CLINICAL IMPRESSION: Patient is a 73 y.o. female who was seen today for physical therapy evaluation and treatment for mixed incontinence. Patient reports she has been having urinary leakage for several year. She says her leakage mostly happens when at home. She uses a pad 2-3 times per day. Patient has a lower abdominal scar that is limited due to her having a perforated intestine and had a colostomy back then a reversal. Her scar is limited. She has difficulty with contracting her lower abdominals. She will bulge her lower abdomen when trying to contract it and with activity. Patient will move her pelvic floor 7 mm downward when trying to contract the pelvic floor. Patient will benefit from skilled therapy to improve pelvic floor coordination and strength to reduce urinary leakage.    OBJECTIVE IMPAIRMENTS: decreased activity tolerance, decreased coordination, and decreased strength.   ACTIVITY LIMITATIONS: continence  PARTICIPATION LIMITATIONS: cleaning  PERSONAL FACTORS: 1-2 comorbidities: Appendectomy; exploratory laparotomy sigmoid colectomy with colostomy 06/27/2021; robotic assisted colostomy takedown 01/12/2022; Melanoma; Memory change  are also affecting patient's functional outcome.   REHAB POTENTIAL: Good  CLINICAL DECISION MAKING: Evolving/moderate complexity  EVALUATION COMPLEXITY: Moderate   GOALS: Goals reviewed with patient? Yes  SHORT TERM GOALS: Target date: 10/05/23  Patient is independent with lower abdominal  contraction.  Baseline: Goal status: INITIAL  2.  Patient understands how to perform scar massage to improve tissue mobility around the bladder.  Baseline:  Goal status: INITIAL  3.  Patient able to contract the lower abdominals correctly without bulging them.  Baseline:  Goal status: INITIAL   LONG TERM GOALS: Target date: 11/30/23  Patient independent with advanced HEP for core and pelvic floor strengthening.  Baseline:  Goal status: INITIAL  2.  Patient is able to move the pelvic floor upward >/= 4 mm with the RUSI to reduce urinary leakage.  Baseline:  Goal status: INITIAL  3.  Patient reports she is only using 1 pad per day due to just in case due to the reduction of urinary leakage.  Baseline:  Goal status: INITIAL  4.  Patient UIQ-7 score decreased from 10 to 3 due to reduction of urinary leakage and her frustration with it.  Baseline:  Goal status: INITIAL   PLAN:  PT FREQUENCY: 1x/week  PT DURATION: 12 weeks  PLANNED INTERVENTIONS: 97110-Therapeutic exercises, 97530- Therapeutic activity, 97112- Neuromuscular re-education, 97535- Self Care, 54098- Manual therapy, Patient/Family education, Scar mobilization, and Biofeedback  PLAN FOR NEXT SESSION: manual work to the lower abdominal scar, lower abdominal contraction, diaphragmatic breathing   Eulis Foster, PT 09/07/23 12:12 PM

## 2023-09-07 ENCOUNTER — Other Ambulatory Visit: Payer: Self-pay

## 2023-09-07 ENCOUNTER — Encounter: Payer: Self-pay | Admitting: Physical Therapy

## 2023-09-07 ENCOUNTER — Ambulatory Visit: Attending: Obstetrics and Gynecology | Admitting: Physical Therapy

## 2023-09-07 DIAGNOSIS — R278 Other lack of coordination: Secondary | ICD-10-CM | POA: Insufficient documentation

## 2023-09-07 DIAGNOSIS — M6281 Muscle weakness (generalized): Secondary | ICD-10-CM | POA: Diagnosis not present

## 2023-09-07 DIAGNOSIS — N3946 Mixed incontinence: Secondary | ICD-10-CM | POA: Insufficient documentation

## 2023-09-12 ENCOUNTER — Encounter: Payer: Self-pay | Admitting: Physical Therapy

## 2023-09-12 ENCOUNTER — Ambulatory Visit: Admitting: Physical Therapy

## 2023-09-12 DIAGNOSIS — R278 Other lack of coordination: Secondary | ICD-10-CM | POA: Diagnosis not present

## 2023-09-12 DIAGNOSIS — N3946 Mixed incontinence: Secondary | ICD-10-CM | POA: Diagnosis not present

## 2023-09-12 DIAGNOSIS — M6281 Muscle weakness (generalized): Secondary | ICD-10-CM | POA: Diagnosis not present

## 2023-09-12 DIAGNOSIS — F411 Generalized anxiety disorder: Secondary | ICD-10-CM | POA: Diagnosis not present

## 2023-09-12 NOTE — Therapy (Signed)
 OUTPATIENT PHYSICAL THERAPY FEMALE PELVIC TREATMENT   Patient Name: Carla Ramirez MRN: [id number] DOB:1950-10-19, 73 y.o., female Today's Date: 09/12/2023  END OF SESSION:  PT End of Session - 09/12/23 1529     Visit Number 2    Date for PT Re-Evaluation 11/30/23    Authorization Type [employer]    Authorization Time Period 09/07/2023 - 11/30/2023    Authorization - Visit Number 2    Authorization - Number of Visits 8    Progress Note Due on Visit 10    PT Start Time 1530    PT Stop Time 1610    PT Time Calculation (min) 40 min    Activity Tolerance Patient tolerated treatment well    Behavior During Therapy [facility] for tasks assessed/performed             Past Medical History:  Diagnosis Date   Acid reflux    Anxiety    Arthritis    Diverticulosis    History of hiatal hernia    Hypertension    Iron deficiency anemia 03/19/2014   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  Dr. Lomax follows.   Memory change 08/16/2015   Spinal stenosis    Past Surgical History:  Procedure Laterality Date   APPENDECTOMY     COLONOSCOPY WITH ESOPHAGOGASTRODUODENOSCOPY (EGD)     COLOSTOMY     FLEXIBLE SIGMOIDOSCOPY N/A 01/12/2022   Procedure: FLEXIBLE SIGMOIDOSCOPY;  Surgeon: Melvenia Stabs, MD;  Location: [facility];  Service: General;  Laterality: N/A;   LAPAROTOMY N/A 06/27/2021   Procedure: EXPLORATORY LAPAROTOMY, SIGMOID COLECTOMY WITH COLOSTOMY;  Surgeon: Caralyn Chandler, MD;  Location: [facility];  Service: General;  Laterality: N/A;   LYSIS OF ADHESION N/A 01/12/2022   Procedure: LYSIS OF ADHESION;  Surgeon: Melvenia Stabs, MD;  Location: [facility];  Service: General;  Laterality: N/A;   RENAL ANGIOGRAPHY Right 02/05/2019   Procedure: RENAL ANGIOGRAPHY;  Surgeon: Young Hensen, MD;  Location: [facility];  Service: Cardiovascular;  Laterality: Right;   XI ROBOTIC ASSISTED COLOSTOMY TAKEDOWN N/A 01/12/2022   Procedure: XI ROBOTIC ASSISTED COLOSTOMY TAKEDOWN  AND INTRAOPERATIVE ASSESSMENT OF PERFUSION ICG;  Surgeon: Melvenia Stabs, MD;  Location: [facility];  Service: General;  Laterality: N/A;   Patient Active Problem List   Diagnosis Date Noted   Anxiety 07/17/2022   S/P colostomy takedown 01/12/2022   Pneumoperitoneum 06/27/2021   Perforation of sigmoid colon due to diverticulitis 06/27/2021   Renal artery stenosis (HCC) 03/28/2019   Memory change 08/16/2015   Leukopenia 03/19/2014   Iron deficiency anemia 03/19/2014   Essential hypertension, benign 02/05/2014   Hemorrhoids 02/05/2014   Acid reflux 02/21/2013    PCP: Olin Bertin, MD  REFERRING PROVIDER: Greta Leatherwood, MD   REFERRING DIAG: [phone] (ICD-10-CM) - Mixed incontinence   THERAPY DIAG:  Muscle weakness (generalized)  Other lack of coordination  Rationale for Evaluation and Treatment: Rehabilitation  ONSET DATE: 2020  SUBJECTIVE:  SUBJECTIVE STATEMENT: I felt good after the initial evaluation.  Fluid intake: water  PAIN:  Are you having pain? No  PRECAUTIONS: Other: Melanoma  RED FLAGS: None   WEIGHT BEARING RESTRICTIONS: No  FALLS:  Has patient fallen in last 6 months? No  OCCUPATION: retired  ACTIVITY LEVEL : exercise class 2 days per week, walks, go to the [employer]  PLOF: Independent  PATIENT GOALS: reduce leakage to not wear pads  PERTINENT HISTORY:  Appendectomy; exploratory laparotomy sigmoid colectomy with colostomy 06/27/2021; robotic assisted colostomy takedown 01/12/2022; Melanoma; Memory change Sexual abuse: No  BOWEL MOVEMENT:  Pain with bowel movement: No  URINATION: Pain with urination: No Fully empty bladder: Yes:   Stream: Strong Urgency: Yes , sometimes Frequency: average Leakage: Urge to void, Walking to the bathroom, and  Sneezing Pads: Yes: when at home she will wear a pad and it happens more at the house. 2-3 pads and pads are damp  INTERCOURSE: not active   PREGNANCY: Vaginal deliveries 2, 1 child and then set of twins   PROLAPSE: None   OBJECTIVE:  Note: Objective measures were completed at Evaluation unless otherwise noted.  DIAGNOSTIC FINDINGS:  none  PATIENT SURVEYS:  PFIQ-7: 6 UIQ-7: 10 COGNITION: Overall cognitive status: Within functional limits for tasks assessed     SENSATION: Light touch: Appears intact    LUMBARAROM/PROM: lumbar ROM decreased by 25%   LOWER EXTREMITY ROM:  Passive ROM Right eval Left eval  Hip external rotation 60 60   (Blank rows = not tested)  LOWER EXTREMITY MMT:  MMT Right eval Left eval  Hip abduction 4/5 4/5   (Blank rows = not tested) PALPATION:   Abdominal: lower abdominal scar has restrictions, difficulty with contracting the lower abdominals, difficulty with performing diaphragmatic breathing.                Patient confirms identification and approves PT to assess internal pelvic floor and treatment No  PELVIC MMT: Patient is not comfortable with internal muscle assessment so therapist did the  RUSI : the ultrasound with gel above the pubic bone on pelvic floor program: Patient will move bladder down 7 mm and not able to contract the pelvic floor. She is not able to move the pelvic floor upward and will bulge her abdomen putting pressure on the bladder   MMT eval  Vaginal   (Blank rows = not tested)         TODAY'S TREATMENT:   09/12/22 Manual: Soft tissue mobilization: Manual work to the diaphragm to improve the mobility Scar tissue mobilization: Manual work to the lower abdominal scar to improve the mobility Pull up on the scar and mobilization  Neuromuscular re-education: Core retraining: Core facilitation: Hip flexion isometric with good engagement of the lower abdomen Transverse abdominus contraction in supine and  sitting working on contraction instead of bulging Down training: Diaphragmatic breathing in supine and sitting with tactile cues to bulge the abdomen but she will contract at times.  DATE: 09/07/23  EVAL Examination completed, findings reviewed, pt educated on POC, HEP, and female pelvic floor anatomy, reasoning with pelvic floor assessment internally with pt consent,. Pt motivated to participate in PT and agreeable to attempt recommendations.     PATIENT EDUCATION:  09/12/23 Education details: Access Code: [email or web address] Person educated: Patient Education method: Explanation, Demonstration, Tactile cues, Verbal cues, and Handouts Education comprehension: verbalized understanding, returned demonstration, verbal cues required, tactile cues required, and needs further education  HOME EXERCISE PROGRAM: 09/12/23 Access Code: [email or web address] URL: [email or web address] Date: 09/12/2023 Prepared by: Eulis Foster  Exercises - Hooklying Isometric Hip Flexion  - 2 x daily - 7 x weekly - 1 sets - 10 reps - Supine Diaphragmatic Breathing  - 2 x daily - 7 x weekly - 1 sets - 10 reps - Hooklying Transversus Abdominis Palpation  - 2 x daily - 7 x weekly - 1 sets - 10 reps  ASSESSMENT:  CLINICAL IMPRESSION: Patient is a 73 y.o. female who was seen today for physical therapy  treatment for mixed incontinence.  Patient will contract her upper abdominals instead of relaxing with diaphragmatic breathing. She is able to contract the lower abdominals better. She noticed less urinary leakage since initial visit. Patent has less of an indentation of her lower abdomen due to improve scar mobility. Patient will benefit from skilled therapy to improve pelvic floor coordination and strength to reduce urinary leakage.    OBJECTIVE IMPAIRMENTS: decreased activity tolerance, decreased  coordination, and decreased strength.   ACTIVITY LIMITATIONS: continence  PARTICIPATION LIMITATIONS: cleaning  PERSONAL FACTORS: 1-2 comorbidities: Appendectomy; exploratory laparotomy sigmoid colectomy with colostomy 06/27/2021; robotic assisted colostomy takedown 01/12/2022; Melanoma; Memory change  are also affecting patient's functional outcome.   REHAB POTENTIAL: Good  CLINICAL DECISION MAKING: Evolving/moderate complexity  EVALUATION COMPLEXITY: Moderate   GOALS: Goals reviewed with patient? Yes  SHORT TERM GOALS: Target date: 10/05/23  Patient is independent with lower abdominal contraction.  Baseline: Goal status: INITIAL  2.  Patient understands how to perform scar massage to improve tissue mobility around the bladder.  Baseline:  Goal status: INITIAL  3.  Patient able to contract the lower abdominals correctly without bulging them.  Baseline:  Goal status: INITIAL   LONG TERM GOALS: Target date: 11/30/23  Patient independent with advanced HEP for core and pelvic floor strengthening.  Baseline:  Goal status: INITIAL  2.  Patient is able to move the pelvic floor upward >/= 4 mm with the RUSI to reduce urinary leakage.  Baseline:  Goal status: INITIAL  3.  Patient reports she is only using 1 pad per day due to just in case due to the reduction of urinary leakage.  Baseline:  Goal status: INITIAL  4.  Patient UIQ-7 score decreased from 10 to 3 due to reduction of urinary leakage and her frustration with it.  Baseline:  Goal status: INITIAL   PLAN:  PT FREQUENCY: 1x/week  PT DURATION: 12 weeks  PLANNED INTERVENTIONS: 97110-Therapeutic exercises, 97530- Therapeutic activity, 97112- Neuromuscular re-education, 97535- Self Care, 52841- Manual therapy, Patient/Family education, Scar mobilization, and Biofeedback  PLAN FOR NEXT SESSION: manual work to the lower abdominal scar, lower abdominal contraction, diaphragmatic breathing, educate on scar massage, RUSI on  the diaphragm   Eulis Foster, PT 09/12/23 4:17 PM

## 2023-09-19 ENCOUNTER — Encounter: Payer: Self-pay | Admitting: Physical Therapy

## 2023-09-19 ENCOUNTER — Ambulatory Visit: Payer: Self-pay | Admitting: Physical Therapy

## 2023-09-19 DIAGNOSIS — M6281 Muscle weakness (generalized): Secondary | ICD-10-CM | POA: Diagnosis not present

## 2023-09-19 DIAGNOSIS — R278 Other lack of coordination: Secondary | ICD-10-CM

## 2023-09-19 DIAGNOSIS — N3946 Mixed incontinence: Secondary | ICD-10-CM | POA: Diagnosis not present

## 2023-09-19 NOTE — Therapy (Signed)
 OUTPATIENT PHYSICAL THERAPY FEMALE PELVIC TREATMENT   Patient Name: Carla Ramirez MRN: [id number] DOB:1951-03-11, 73 y.o., female Today's Date: 09/19/2023  END OF SESSION:  PT End of Session - 09/19/23 1148     Visit Number 3    Date for PT Re-Evaluation 11/30/23    Authorization Type [employer]    Authorization Time Period 09/07/2023 - 11/30/2023    Authorization - Visit Number 3    Authorization - Number of Visits 8    Progress Note Due on Visit 10    PT Start Time 1145    PT Stop Time 1225    PT Time Calculation (min) 40 min    Activity Tolerance Patient tolerated treatment well    Behavior During Therapy [facility] for tasks assessed/performed             Past Medical History:  Diagnosis Date   Acid reflux    Anxiety    Arthritis    Diverticulosis    History of hiatal hernia    Hypertension    Iron deficiency anemia 03/19/2014   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  Dr. Lomax follows.   Memory change 08/16/2015   Spinal stenosis    Past Surgical History:  Procedure Laterality Date   APPENDECTOMY     COLONOSCOPY WITH ESOPHAGOGASTRODUODENOSCOPY (EGD)     COLOSTOMY     FLEXIBLE SIGMOIDOSCOPY N/A 01/12/2022   Procedure: FLEXIBLE SIGMOIDOSCOPY;  Surgeon: Melvenia Stabs, MD;  Location: [facility];  Service: General;  Laterality: N/A;   LAPAROTOMY N/A 06/27/2021   Procedure: EXPLORATORY LAPAROTOMY, SIGMOID COLECTOMY WITH COLOSTOMY;  Surgeon: Caralyn Chandler, MD;  Location: [facility];  Service: General;  Laterality: N/A;   LYSIS OF ADHESION N/A 01/12/2022   Procedure: LYSIS OF ADHESION;  Surgeon: Melvenia Stabs, MD;  Location: [facility];  Service: General;  Laterality: N/A;   RENAL ANGIOGRAPHY Right 02/05/2019   Procedure: RENAL ANGIOGRAPHY;  Surgeon: Young Hensen, MD;  Location: [facility];  Service: Cardiovascular;  Laterality: Right;   XI ROBOTIC ASSISTED COLOSTOMY TAKEDOWN N/A 01/12/2022   Procedure: XI ROBOTIC ASSISTED COLOSTOMY TAKEDOWN  AND INTRAOPERATIVE ASSESSMENT OF PERFUSION ICG;  Surgeon: Melvenia Stabs, MD;  Location: [facility];  Service: General;  Laterality: N/A;   Patient Active Problem List   Diagnosis Date Noted   Anxiety 07/17/2022   S/P colostomy takedown 01/12/2022   Pneumoperitoneum 06/27/2021   Perforation of sigmoid colon due to diverticulitis 06/27/2021   Renal artery stenosis (HCC) 03/28/2019   Memory change 08/16/2015   Leukopenia 03/19/2014   Iron deficiency anemia 03/19/2014   Essential hypertension, benign 02/05/2014   Hemorrhoids 02/05/2014   Acid reflux 02/21/2013    PCP: Olin Bertin, MD  REFERRING PROVIDER: Greta Leatherwood, MD   REFERRING DIAG: [phone] (ICD-10-CM) - Mixed incontinence   THERAPY DIAG:  Muscle weakness (generalized)  Other lack of coordination  Rationale for Evaluation and Treatment: Rehabilitation  ONSET DATE: 2020  SUBJECTIVE:  SUBJECTIVE STATEMENT: Over the weekend I would wait too long and get to the commode. I am not waking up at night now.  I felt good after the initial evaluation.  Fluid intake: water   PAIN:  Are you having pain? No  PRECAUTIONS: Other: Melanoma  RED FLAGS: None   WEIGHT BEARING RESTRICTIONS: No  FALLS:  Has patient fallen in last 6 months? No  OCCUPATION: retired  ACTIVITY LEVEL : exercise class 2 days per week, walks, go to the [employer]  PLOF: Independent  PATIENT GOALS: reduce leakage to not wear pads  PERTINENT HISTORY:  Appendectomy; exploratory laparotomy sigmoid colectomy with colostomy 06/27/2021; robotic assisted colostomy takedown 01/12/2022; Melanoma; Memory change Sexual abuse: No  BOWEL MOVEMENT:  Pain with bowel movement: No  URINATION: Pain with urination: No Fully empty bladder: Yes:   Stream:  Strong Urgency: Yes , sometimes Frequency: average Leakage: Urge to void, Walking to the bathroom, and Sneezing Pads: Yes: when at home she will wear a pad and it happens more at the house. 2-3 pads and pads are damp  INTERCOURSE: not active   PREGNANCY: Vaginal deliveries 2, 1 child and then set of twins   PROLAPSE: None   OBJECTIVE:  Note: Objective measures were completed at Evaluation unless otherwise noted.  DIAGNOSTIC FINDINGS:  none  PATIENT SURVEYS:  PFIQ-7: 6 UIQ-7: 10 COGNITION: Overall cognitive status: Within functional limits for tasks assessed     SENSATION: Light touch: Appears intact    LUMBARAROM/PROM: lumbar ROM decreased by 25%   LOWER EXTREMITY ROM:  Passive ROM Right eval Left eval  Hip external rotation 60 60   (Blank rows = not tested)  LOWER EXTREMITY MMT:  MMT Right eval Left eval  Hip abduction 4/5 4/5   (Blank rows = not tested) PALPATION:   Abdominal: lower abdominal scar has restrictions, difficulty with contracting the lower abdominals, difficulty with performing diaphragmatic breathing.                Patient confirms identification and approves PT to assess internal pelvic floor and treatment No  PELVIC MMT: Patient is not comfortable with internal muscle assessment so therapist did the  RUSI : the ultrasound with gel above the pubic bone on pelvic floor program: Patient will move bladder down 7 mm and not able to contract the pelvic floor. She is not able to move the pelvic floor upward and will bulge her abdomen putting pressure on the bladder   MMT eval  Vaginal   (Blank rows = not tested)         TODAY'S TREATMENT:   09/19/23 Manual: Scar tissue mobilization: Manual work to the lower abdominal scar to improve the mobility Pull up on the scar and mobilization  Educated patient on how to work on her scar at home with the different motions Neuromuscular re-education: Form correction: Transverse abdominus  contraction in supine  on contraction instead of bulging Hip flexion isometric with good engagement of the lower abdomen with her hand on the abdomen to prevent it from coming outward Quadruped abdominal contraction with tactile cues to pull the abdomen upward Exercises: Strengthening: Nustep level 2 for 6 minutes while assessing patient.     09/12/22 Manual: Soft tissue mobilization: Manual work to the diaphragm to improve the mobility Scar tissue mobilization: Manual work to the lower abdominal scar to improve the mobility Pull up on the scar and mobilization  Neuromuscular re-education: Core facilitation: Hip flexion isometric with good engagement of the lower abdomen Transverse  abdominus contraction in supine and sitting working on contraction instead of bulging Down training: Diaphragmatic breathing in supine and sitting with tactile cues to bulge the abdomen but she will contract at times.                                                                                                                              DATE: 09/07/23  EVAL Examination completed, findings reviewed, pt educated on POC, HEP, and female pelvic floor anatomy, reasoning with pelvic floor assessment internally with pt consent,. Pt motivated to participate in PT and agreeable to attempt recommendations.     PATIENT EDUCATION:  09/19/23 Education details: Access Code: [email or web address] Person educated: Patient Education method: Explanation, Demonstration, Tactile cues, Verbal cues, and Handouts Education comprehension: verbalized understanding, returned demonstration, verbal cues required, tactile cues required, and needs further education  HOME EXERCISE PROGRAM: 09/19/23 Access Code: [email or web address] URL: [email or web address] Date: 09/19/2023 Prepared by: Marsha Skeen  Exercises - Hooklying Isometric Hip Flexion  - 2 x daily - 7 x weekly - 1 sets - 10 reps - Supine Diaphragmatic Breathing  - 2 x daily - 7 x  weekly - 1 sets - 10 reps - Hooklying Transversus Abdominis Palpation  - 1 x daily - 7 x weekly - 1 sets - 10 reps - Quadruped Transversus Abdominis Bracing  - 1 x daily - 7 x weekly - 3 sets - 10 reps   ASSESSMENT:  CLINICAL IMPRESSION: Patient is a 73 y.o. female who was seen today for physical therapy  treatment for mixed incontinence.  Urinary leakage is 50% better since last visit. Patient still needs tactile cues to not bulge her abdomen when contracting the abdomen. Patient understands how to do scar massage.  Patient will benefit from skilled therapy to improve pelvic floor coordination and strength to reduce urinary leakage.    OBJECTIVE IMPAIRMENTS: decreased activity tolerance, decreased coordination, and decreased strength.   ACTIVITY LIMITATIONS: continence  PARTICIPATION LIMITATIONS: cleaning  PERSONAL FACTORS: 1-2 comorbidities: Appendectomy; exploratory laparotomy sigmoid colectomy with colostomy 06/27/2021; robotic assisted colostomy takedown 01/12/2022; Melanoma; Memory change  are also affecting patient's functional outcome.   REHAB POTENTIAL: Good  CLINICAL DECISION MAKING: Evolving/moderate complexity  EVALUATION COMPLEXITY: Moderate   GOALS: Goals reviewed with patient? Yes  SHORT TERM GOALS: Target date: 10/05/23  Patient is independent with lower abdominal contraction.  Baseline: Goal status: Met 09/19/23  2.  Patient understands how to perform scar massage to improve tissue mobility around the bladder.  Baseline:  Goal status: met 09/19/23  3.  Patient able to contract the lower abdominals correctly without bulging them.  Baseline:  Goal status: INITIAL   LONG TERM GOALS: Target date: 11/30/23  Patient independent with advanced HEP for core and pelvic floor strengthening.  Baseline:  Goal status: INITIAL  2.  Patient is able to move the pelvic floor upward >/= 4 mm with the RUSI to reduce urinary leakage.  Baseline:  Goal status: INITIAL  3.   Patient reports she is only using 1 pad per day due to just in case due to the reduction of urinary leakage.  Baseline:  Goal status: INITIAL  4.  Patient UIQ-7 score decreased from 10 to 3 due to reduction of urinary leakage and her frustration with it.  Baseline:  Goal status: INITIAL   PLAN:  PT FREQUENCY: 1x/week  PT DURATION: 12 weeks  PLANNED INTERVENTIONS: 97110-Therapeutic exercises, 97530- Therapeutic activity, 97112- Neuromuscular re-education, 97535- Self Care, 16109- Manual therapy, Patient/Family education, Scar mobilization, and Biofeedback  PLAN FOR NEXT SESSION: , lower abdominal contraction, diaphragmatic breathing, educate on scar massage, RUSI on the diaphragm   Marsha Skeen, PT 09/19/23 12:30 PM

## 2023-09-24 ENCOUNTER — Telehealth: Payer: Self-pay | Admitting: Neurology

## 2023-09-24 DIAGNOSIS — Z1231 Encounter for screening mammogram for malignant neoplasm of breast: Secondary | ICD-10-CM | POA: Diagnosis not present

## 2023-09-24 DIAGNOSIS — M8588 Other specified disorders of bone density and structure, other site: Secondary | ICD-10-CM | POA: Diagnosis not present

## 2023-09-24 NOTE — Telephone Encounter (Signed)
 Appointment details confirmed

## 2023-09-26 ENCOUNTER — Encounter: Payer: Self-pay | Admitting: Obstetrics and Gynecology

## 2023-09-27 ENCOUNTER — Encounter: Payer: Self-pay | Admitting: Obstetrics and Gynecology

## 2023-10-01 ENCOUNTER — Encounter: Payer: Self-pay | Admitting: Obstetrics and Gynecology

## 2023-10-01 ENCOUNTER — Encounter: Payer: Self-pay | Admitting: Physical Therapy

## 2023-10-01 ENCOUNTER — Ambulatory Visit: Payer: Self-pay | Attending: Obstetrics and Gynecology | Admitting: Physical Therapy

## 2023-10-01 DIAGNOSIS — F418 Other specified anxiety disorders: Secondary | ICD-10-CM | POA: Diagnosis not present

## 2023-10-01 DIAGNOSIS — R278 Other lack of coordination: Secondary | ICD-10-CM | POA: Insufficient documentation

## 2023-10-01 DIAGNOSIS — M6281 Muscle weakness (generalized): Secondary | ICD-10-CM | POA: Insufficient documentation

## 2023-10-01 NOTE — Therapy (Signed)
 OUTPATIENT PHYSICAL THERAPY FEMALE PELVIC TREATMENT   Patient Name: Carla Ramirez MRN: [id number] DOB:06/12/1950, 73 y.o., female Today's Date: 10/01/2023  END OF SESSION:  PT End of Session - 10/01/23 1145     Visit Number 4    Date for PT Re-Evaluation 11/30/23    Authorization Type [employer]    Authorization Time Period 09/07/2023 - 11/30/2023    Authorization - Visit Number 4    Authorization - Number of Visits 8    Progress Note Due on Visit 10    PT Start Time 1145    PT Stop Time 1225    PT Time Calculation (min) 40 min    Activity Tolerance Patient tolerated treatment well    Behavior During Therapy [facility] for tasks assessed/performed             Past Medical History:  Diagnosis Date   Acid reflux    Anxiety    Arthritis    Diverticulosis    History of hiatal hernia    Hypertension    Iron deficiency anemia 03/19/2014   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  Dr. Lomax follows.   Memory change 08/16/2015   Spinal stenosis    Past Surgical History:  Procedure Laterality Date   APPENDECTOMY     COLONOSCOPY WITH ESOPHAGOGASTRODUODENOSCOPY (EGD)     COLOSTOMY     FLEXIBLE SIGMOIDOSCOPY N/A 01/12/2022   Procedure: FLEXIBLE SIGMOIDOSCOPY;  Surgeon: Melvenia Stabs, MD;  Location: [facility];  Service: General;  Laterality: N/A;   LAPAROTOMY N/A 06/27/2021   Procedure: EXPLORATORY LAPAROTOMY, SIGMOID COLECTOMY WITH COLOSTOMY;  Surgeon: Caralyn Chandler, MD;  Location: [facility];  Service: General;  Laterality: N/A;   LYSIS OF ADHESION N/A 01/12/2022   Procedure: LYSIS OF ADHESION;  Surgeon: Melvenia Stabs, MD;  Location: [facility];  Service: General;  Laterality: N/A;   RENAL ANGIOGRAPHY Right 02/05/2019   Procedure: RENAL ANGIOGRAPHY;  Surgeon: Young Hensen, MD;  Location: [facility];  Service: Cardiovascular;  Laterality: Right;   XI ROBOTIC ASSISTED COLOSTOMY TAKEDOWN N/A 01/12/2022   Procedure: XI ROBOTIC ASSISTED COLOSTOMY TAKEDOWN  AND INTRAOPERATIVE ASSESSMENT OF PERFUSION ICG;  Surgeon: Melvenia Stabs, MD;  Location: [facility];  Service: General;  Laterality: N/A;   Patient Active Problem List   Diagnosis Date Noted   Anxiety 07/17/2022   S/P colostomy takedown 01/12/2022   Pneumoperitoneum 06/27/2021   Perforation of sigmoid colon due to diverticulitis 06/27/2021   Renal artery stenosis (HCC) 03/28/2019   Memory change 08/16/2015   Leukopenia 03/19/2014   Iron deficiency anemia 03/19/2014   Essential hypertension, benign 02/05/2014   Hemorrhoids 02/05/2014   Acid reflux 02/21/2013    PCP: Olin Bertin, MD  REFERRING PROVIDER: Greta Leatherwood, MD   REFERRING DIAG: [phone] (ICD-10-CM) - Mixed incontinence   THERAPY DIAG:  Muscle weakness (generalized)  Other lack of coordination  Rationale for Evaluation and Treatment: Rehabilitation  ONSET DATE: 2020  SUBJECTIVE:  SUBJECTIVE STATEMENT: I have used 2 pads in a day and sometimes I went through the night.  Patient is not having bowel movements as often and more like Type 4 but shorter and not Type 5.  Fluid intake: water   PAIN:  Are you having pain? No  PRECAUTIONS: Other: Melanoma  RED FLAGS: None   WEIGHT BEARING RESTRICTIONS: No  FALLS:  Has patient fallen in last 6 months? No  OCCUPATION: retired  ACTIVITY LEVEL : exercise class 2 days per week, walks, go to the [employer]  PLOF: Independent  PATIENT GOALS: reduce leakage to not wear pads  PERTINENT HISTORY:  Appendectomy; exploratory laparotomy sigmoid colectomy with colostomy 06/27/2021; robotic assisted colostomy takedown 01/12/2022; Melanoma; Memory change Sexual abuse: No  BOWEL MOVEMENT:  Pain with bowel movement: No  URINATION: Pain with urination: No Fully empty bladder: Yes:    Stream: Strong Urgency: Yes , sometimes Frequency: average Leakage: Urge to void, Walking to the bathroom, and Sneezing Pads: Yes: when at home she will wear a pad and it happens more at the house. 2-3 pads and pads are damp  INTERCOURSE: not active   PREGNANCY: Vaginal deliveries 2, 1 child and then set of twins   PROLAPSE: None   OBJECTIVE:  Note: Objective measures were completed at Evaluation unless otherwise noted.  DIAGNOSTIC FINDINGS:  none  PATIENT SURVEYS:  PFIQ-7: 6 UIQ-7: 10 COGNITION: Overall cognitive status: Within functional limits for tasks assessed     SENSATION: Light touch: Appears intact    LUMBARAROM/PROM: lumbar ROM decreased by 25%   LOWER EXTREMITY ROM:  Passive ROM Right eval Left eval  Hip external rotation 60 60   (Blank rows = not tested)  LOWER EXTREMITY MMT:  MMT Right eval Left eval  Hip abduction 4/5 4/5   (Blank rows = not tested) PALPATION:   Abdominal: lower abdominal scar has restrictions, difficulty with contracting the lower abdominals, difficulty with performing diaphragmatic breathing.                Patient confirms identification and approves PT to assess internal pelvic floor and treatment No  PELVIC MMT: Patient is not comfortable with internal muscle assessment so therapist did the  RUSI : the ultrasound with gel above the pubic bone on pelvic floor program: Patient will move bladder down 7 mm and not able to contract the pelvic floor. She is not able to move the pelvic floor upward and will bulge her abdomen putting pressure on the bladder   MMT eval  Vaginal   (Blank rows = not tested)         TODAY'S TREATMENT:   10/01/23 Neuromuscular re-education: Core retraining: Pallof with red band and therapist giving tactile cues in the abdomen  Core facilitation: RUSI using the diaphragm setting with ultrasound head on the right diaphragm looking at it move downward with diaphragmatic breathing and  therapist hands on the rib cage feeling the expansion RUSI with ultrasound head above the pubic bone on the pelvic floor setting working on contracting the pelvic floor instead of bulging and pushing her abdomen outward. Moved the pelvic floor 5 mm.     09/19/23 Manual: Scar tissue mobilization: Manual work to the lower abdominal scar to improve the mobility Pull up on the scar and mobilization  Educated patient on how to work on her scar at home with the different motions Neuromuscular re-education: Form correction: Transverse abdominus contraction in supine  on contraction instead of bulging Hip flexion isometric with good engagement  of the lower abdomen with her hand on the abdomen to prevent it from coming outward Quadruped abdominal contraction with tactile cues to pull the abdomen upward Exercises: Strengthening: Nustep level 2 for 6 minutes while assessing patient.     09/12/22 Manual: Soft tissue mobilization: Manual work to the diaphragm to improve the mobility Scar tissue mobilization: Manual work to the lower abdominal scar to improve the mobility Pull up on the scar and mobilization  Neuromuscular re-education: Core facilitation: Hip flexion isometric with good engagement of the lower abdomen Transverse abdominus contraction in supine and sitting working on contraction instead of bulging Down training: Diaphragmatic breathing in supine and sitting with tactile cues to bulge the abdomen but she will contract at times.   PATIENT EDUCATION:  10/01/23 Education details: Access Code: [email or web address] Person educated: Patient Education method: Explanation, Demonstration, Tactile cues, Verbal cues, and Handouts Education comprehension: verbalized understanding, returned demonstration, verbal cues required, tactile cues required, and needs further education  HOME EXERCISE PROGRAM: 10/01/23 Access Code: [email or web address] URL: [email or web address] Date: 10/01/2023 Prepared by:  Marsha Skeen  Exercises - Hooklying Isometric Hip Flexion  - 2 x daily - 7 x weekly - 1 sets - 10 reps - Supine Diaphragmatic Breathing  - 2 x daily - 7 x weekly - 1 sets - 10 reps - Hooklying Transversus Abdominis Palpation  - 1 x daily - 7 x weekly - 1 sets - 10 reps - Quadruped Transversus Abdominis Bracing  - 1 x daily - 7 x weekly - 3 sets - 10 reps - Standing Anti-Rotation Press with Anchored Resistance  - 1 x daily - 3 x weekly - 1 sets - 10 reps   ASSESSMENT:  CLINICAL IMPRESSION: Patient is a 73 y.o. female who was seen today for physical therapy  treatment for mixed incontinence.  Urinary leakage is 50% better since last visit. Patient still needs tactile cues to not bulge her abdomen when contracting the abdomen. She is able to move her pelvic floor 5 mm but needs continuous verbal cues to not bulge her lower abdomen. She was able to bring her diaphragm downward  and expand the right lower rib cage.   Patient will benefit from skilled therapy to improve pelvic floor coordination and strength to reduce urinary leakage.    OBJECTIVE IMPAIRMENTS: decreased activity tolerance, decreased coordination, and decreased strength.   ACTIVITY LIMITATIONS: continence  PARTICIPATION LIMITATIONS: cleaning  PERSONAL FACTORS: 1-2 comorbidities: Appendectomy; exploratory laparotomy sigmoid colectomy with colostomy 06/27/2021; robotic assisted colostomy takedown 01/12/2022; Melanoma; Memory change  are also affecting patient's functional outcome.   REHAB POTENTIAL: Good  CLINICAL DECISION MAKING: Evolving/moderate complexity  EVALUATION COMPLEXITY: Moderate   GOALS: Goals reviewed with patient? Yes  SHORT TERM GOALS: Target date: 10/05/23  Patient is independent with lower abdominal contraction.  Baseline: Goal status: Met 09/19/23  2.  Patient understands how to perform scar massage to improve tissue mobility around the bladder.  Baseline:  Goal status: met 09/19/23  3.  Patient able to  contract the lower abdominals correctly without bulging them.  Baseline:  Goal status: INITIAL   LONG TERM GOALS: Target date: 11/30/23  Patient independent with advanced HEP for core and pelvic floor strengthening.  Baseline:  Goal status: INITIAL  2.  Patient is able to move the pelvic floor upward >/= 4 mm with the RUSI to reduce urinary leakage.  Baseline:  Goal status: INITIAL  3.  Patient reports she is only using 1 pad per day due to just in case  due to the reduction of urinary leakage.  Baseline:  Goal status: INITIAL  4.  Patient UIQ-7 score decreased from 10 to 3 due to reduction of urinary leakage and her frustration with it.  Baseline:  Goal status: INITIAL   PLAN:  PT FREQUENCY: 1x/week  PT DURATION: 12 weeks  PLANNED INTERVENTIONS: 97110-Therapeutic exercises, 97530- Therapeutic activity, 97112- Neuromuscular re-education, 97535- Self Care, [id number]- Manual therapy, Patient/Family education, Scar mobilization, and Biofeedback  PLAN FOR NEXT SESSION:  lower abdominal contraction, core work   Marsha Skeen, PT 10/01/23 12:28 PM

## 2023-10-02 ENCOUNTER — Encounter: Payer: Self-pay | Admitting: Obstetrics and Gynecology

## 2023-10-08 ENCOUNTER — Ambulatory Visit: Payer: Self-pay | Admitting: Physical Therapy

## 2023-10-08 ENCOUNTER — Encounter: Payer: Self-pay | Admitting: Physical Therapy

## 2023-10-08 DIAGNOSIS — R278 Other lack of coordination: Secondary | ICD-10-CM

## 2023-10-08 DIAGNOSIS — M6281 Muscle weakness (generalized): Secondary | ICD-10-CM

## 2023-10-08 NOTE — Therapy (Signed)
 OUTPATIENT PHYSICAL THERAPY FEMALE PELVIC TREATMENT   Patient Name: Carla Ramirez MRN: [id number] DOB:12/04/1950, 73 y.o., female Today's Date: 10/08/2023  END OF SESSION:  PT End of Session - 10/08/23 1617     Visit Number 5    Date for PT Re-Evaluation 11/30/23    Authorization Type [employer]    Authorization Time Period 09/07/2023 - 11/30/2023    Authorization - Visit Number 5    Authorization - Number of Visits 8    Progress Note Due on Visit 10    PT Start Time 1615    PT Stop Time 1655    PT Time Calculation (min) 40 min    Activity Tolerance Patient tolerated treatment well    Behavior During Therapy [facility] for tasks assessed/performed             Past Medical History:  Diagnosis Date   Acid reflux    Anxiety    Arthritis    Diverticulosis    History of hiatal hernia    Hypertension    Iron deficiency anemia 03/19/2014   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  Dr. Lomax follows.   Memory change 08/16/2015   Osteopenia 2025   Spinal stenosis    Past Surgical History:  Procedure Laterality Date   APPENDECTOMY     COLONOSCOPY WITH ESOPHAGOGASTRODUODENOSCOPY (EGD)     COLOSTOMY     FLEXIBLE SIGMOIDOSCOPY N/A 01/12/2022   Procedure: FLEXIBLE SIGMOIDOSCOPY;  Surgeon: Melvenia Stabs, MD;  Location: [facility];  Service: General;  Laterality: N/A;   LAPAROTOMY N/A 06/27/2021   Procedure: EXPLORATORY LAPAROTOMY, SIGMOID COLECTOMY WITH COLOSTOMY;  Surgeon: Caralyn Chandler, MD;  Location: [facility];  Service: General;  Laterality: N/A;   LYSIS OF ADHESION N/A 01/12/2022   Procedure: LYSIS OF ADHESION;  Surgeon: Melvenia Stabs, MD;  Location: [facility];  Service: General;  Laterality: N/A;   RENAL ANGIOGRAPHY Right 02/05/2019   Procedure: RENAL ANGIOGRAPHY;  Surgeon: Young Hensen, MD;  Location: [facility];  Service: Cardiovascular;  Laterality: Right;   XI ROBOTIC ASSISTED COLOSTOMY TAKEDOWN N/A 01/12/2022   Procedure: XI ROBOTIC ASSISTED  COLOSTOMY TAKEDOWN AND INTRAOPERATIVE ASSESSMENT OF PERFUSION ICG;  Surgeon: Melvenia Stabs, MD;  Location: [facility];  Service: General;  Laterality: N/A;   Patient Active Problem List   Diagnosis Date Noted   Anxiety 07/17/2022   S/P colostomy takedown 01/12/2022   Pneumoperitoneum 06/27/2021   Perforation of sigmoid colon due to diverticulitis 06/27/2021   Renal artery stenosis (HCC) 03/28/2019   Memory change 08/16/2015   Leukopenia 03/19/2014   Iron deficiency anemia 03/19/2014   Essential hypertension, benign 02/05/2014   Hemorrhoids 02/05/2014   Acid reflux 02/21/2013    PCP: Olin Bertin, MD  REFERRING PROVIDER: Greta Leatherwood, MD   REFERRING DIAG: [phone] (ICD-10-CM) - Mixed incontinence   THERAPY DIAG:  Muscle weakness (generalized)  Other lack of coordination  Rationale for Evaluation and Treatment: Rehabilitation  ONSET DATE: 2020  SUBJECTIVE:  SUBJECTIVE STATEMENT: I am using 2 pads per day and damp. I get busy and do not get to the bathroom as I should.  Fluid intake: water   PAIN:  Are you having pain? No  PRECAUTIONS: Other: Melanoma  RED FLAGS: None   WEIGHT BEARING RESTRICTIONS: No  FALLS:  Has patient fallen in last 6 months? No  OCCUPATION: retired  ACTIVITY LEVEL : exercise class 2 days per week, walks, go to the [employer]  PLOF: Independent  PATIENT GOALS: reduce leakage to not wear pads  PERTINENT HISTORY:  Appendectomy; exploratory laparotomy sigmoid colectomy with colostomy 06/27/2021; robotic assisted colostomy takedown 01/12/2022; Melanoma; Memory change Sexual abuse: No  BOWEL MOVEMENT:  Pain with bowel movement: No  URINATION: Pain with urination: No Fully empty bladder: Yes:   Stream: Strong Urgency: Yes , sometimes Frequency:  average Leakage: Urge to void, Walking to the bathroom, and Sneezing Pads: Yes: when at home she will wear a pad and it happens more at the house. 2-3 pads and pads are damp  INTERCOURSE: not active   PREGNANCY: Vaginal deliveries 2, 1 child and then set of twins   PROLAPSE: None   OBJECTIVE:  Note: Objective measures were completed at Evaluation unless otherwise noted.  DIAGNOSTIC FINDINGS:  none  PATIENT SURVEYS:  PFIQ-7: 6 UIQ-7: 10 COGNITION: Overall cognitive status: Within functional limits for tasks assessed     SENSATION: Light touch: Appears intact    LUMBARAROM/PROM: lumbar ROM decreased by 25%   LOWER EXTREMITY ROM:  Passive ROM Right eval Left eval  Hip external rotation 60 60   (Blank rows = not tested)  LOWER EXTREMITY MMT:  MMT Right eval Left eval  Hip abduction 4/5 4/5   (Blank rows = not tested) PALPATION:   Abdominal: lower abdominal scar has restrictions, difficulty with contracting the lower abdominals, difficulty with performing diaphragmatic breathing.                Patient confirms identification and approves PT to assess internal pelvic floor and treatment No  PELVIC MMT: Patient is not comfortable with internal muscle assessment so therapist did the  RUSI : the ultrasound with gel above the pubic bone on pelvic floor program: Patient will move bladder down 7 mm and not able to contract the pelvic floor. She is not able to move the pelvic floor upward and will bulge her abdomen putting pressure on the bladder   MMT eval  Vaginal   (Blank rows = not tested)         TODAY'S TREATMENT:   10/08/23 Neuromuscular re-education: Core facilitation: V sit and hold thighs while going back and forth to strengthen the lower abdominals Supine with feet on stool, hip flexion isometric to engage the lower abdominals Supine with feet on stool, hip flexion diagonal resistance to engage the obliques Pallof with green band and therapist  giving tactile cues in the abdomen Standing chop with green band and breath to engage the obliques Bilateral shoulder extension to engage the lower abdominals Down training: Diaphragmatic breathing with weight on the abdomen to move the pelvic floor but too difficult for patient to understand.  Self-care: Educated patient on how to perform scar massage to the abdomen and patient returned demonstration    10/01/23 Neuromuscular re-education: Core retraining: Pallof with red band and therapist giving tactile cues in the abdomen  Core facilitation: RUSI using the diaphragm setting with ultrasound head on the right diaphragm looking at it move downward with diaphragmatic breathing and therapist  hands on the rib cage feeling the expansion RUSI with ultrasound head above the pubic bone on the pelvic floor setting working on contracting the pelvic floor instead of bulging and pushing her abdomen outward. Moved the pelvic floor 5 mm.     09/19/23 Manual: Scar tissue mobilization: Manual work to the lower abdominal scar to improve the mobility Pull up on the scar and mobilization  Educated patient on how to work on her scar at home with the different motions Neuromuscular re-education: Form correction: Transverse abdominus contraction in supine  on contraction instead of bulging Hip flexion isometric with good engagement of the lower abdomen with her hand on the abdomen to prevent it from coming outward Quadruped abdominal contraction with tactile cues to pull the abdomen upward Exercises: Strengthening: Nustep level 2 for 6 minutes while assessing patient.    PATIENT EDUCATION:  10/01/23 Education details: Access Code: [email or web address] Person educated: Patient Education method: Explanation, Demonstration, Tactile cues, Verbal cues, and Handouts Education comprehension: verbalized understanding, returned demonstration, verbal cues required, tactile cues required, and needs further education  HOME  EXERCISE PROGRAM: 10/01/23 Access Code: [email or web address] URL: [email or web address] Date: 10/01/2023 Prepared by: Marsha Skeen  Exercises - Hooklying Isometric Hip Flexion  - 2 x daily - 7 x weekly - 1 sets - 10 reps - Supine Diaphragmatic Breathing  - 2 x daily - 7 x weekly - 1 sets - 10 reps - Hooklying Transversus Abdominis Palpation  - 1 x daily - 7 x weekly - 1 sets - 10 reps - Quadruped Transversus Abdominis Bracing  - 1 x daily - 7 x weekly - 3 sets - 10 reps - Standing Anti-Rotation Press with Anchored Resistance  - 1 x daily - 3 x weekly - 1 sets - 10 reps   ASSESSMENT:  CLINICAL IMPRESSION: Patient is a 73 y.o. female who was seen today for physical therapy  treatment for mixed incontinence.  Patient is still working on engaging the lower abdomen instead of bulging to reduce pressure on the pelvic floor and improve continence. Her lower abdominal scar has improve mobility and she understands how to mobilize the scar at home. She still uses 2 pads per day but are damp not soaked. Patient will benefit from skilled therapy to improve pelvic floor coordination and strength to reduce urinary leakage.    OBJECTIVE IMPAIRMENTS: decreased activity tolerance, decreased coordination, and decreased strength.   ACTIVITY LIMITATIONS: continence  PARTICIPATION LIMITATIONS: cleaning  PERSONAL FACTORS: 1-2 comorbidities: Appendectomy; exploratory laparotomy sigmoid colectomy with colostomy 06/27/2021; robotic assisted colostomy takedown 01/12/2022; Melanoma; Memory change are also affecting patient's functional outcome.   REHAB POTENTIAL: Good  CLINICAL DECISION MAKING: Evolving/moderate complexity  EVALUATION COMPLEXITY: Moderate   GOALS: Goals reviewed with patient? Yes  SHORT TERM GOALS: Target date: 10/05/23  Patient is independent with lower abdominal contraction.  Baseline: Goal status: Met 09/19/23  2.  Patient understands how to perform scar massage to improve tissue  mobility around the bladder.  Baseline:  Goal status: met 09/19/23  3.  Patient able to contract the lower abdominals correctly without bulging them.  Baseline:  Goal status: INITIAL   LONG TERM GOALS: Target date: 11/30/23  Patient independent with advanced HEP for core and pelvic floor strengthening.  Baseline:  Goal status: INITIAL  2.  Patient is able to move the pelvic floor upward >/= 4 mm with the RUSI to reduce urinary leakage.  Baseline:  Goal status: INITIAL  3.  Patient reports she is only using 1 pad  per day due to just in case due to the reduction of urinary leakage.  Baseline:  Goal status: INITIAL  4.  Patient UIQ-7 score decreased from 10 to 3 due to reduction of urinary leakage and her frustration with it.  Baseline:  Goal status: INITIAL   PLAN:  PT FREQUENCY: 1x/week  PT DURATION: 12 weeks  PLANNED INTERVENTIONS: 97110-Therapeutic exercises, 97530- Therapeutic activity, 97112- Neuromuscular re-education, 97535- Self Care, 16109- Manual therapy, Patient/Family education, Scar mobilization, and Biofeedback  PLAN FOR NEXT SESSION:  lower abdominal contraction, core work, update HEP, use RUSI on lower abdomen   Marsha Skeen, PT 10/08/23 5:03 PM

## 2023-10-15 DIAGNOSIS — F418 Other specified anxiety disorders: Secondary | ICD-10-CM | POA: Diagnosis not present

## 2023-10-17 ENCOUNTER — Ambulatory Visit: Payer: Self-pay | Admitting: Physical Therapy

## 2023-10-17 ENCOUNTER — Encounter: Payer: Self-pay | Admitting: Physical Therapy

## 2023-10-17 DIAGNOSIS — M6281 Muscle weakness (generalized): Secondary | ICD-10-CM

## 2023-10-17 DIAGNOSIS — R278 Other lack of coordination: Secondary | ICD-10-CM | POA: Diagnosis not present

## 2023-10-17 NOTE — Therapy (Signed)
 OUTPATIENT PHYSICAL THERAPY FEMALE PELVIC TREATMENT   Patient Name: Carla Ramirez MRN: [id number] DOB:February 18, 1951, 73 y.o., female Today's Date: 10/17/2023  END OF SESSION:  PT End of Session - 10/17/23 0845     Visit Number 6    Date for PT Re-Evaluation 11/30/23    Authorization Type [employer]    Authorization Time Period 09/07/2023 - 11/30/2023    Authorization - Visit Number 6    Authorization - Number of Visits 8    Progress Note Due on Visit 10    PT Start Time 0845    PT Stop Time 0925    PT Time Calculation (min) 40 min    Activity Tolerance Patient tolerated treatment well    Behavior During Therapy [facility] for tasks assessed/performed             Past Medical History:  Diagnosis Date   Acid reflux    Anxiety    Arthritis    Diverticulosis    History of hiatal hernia    Hypertension    Iron deficiency anemia 03/19/2014   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  Dr. Lomax follows.   Memory change 08/16/2015   Osteopenia 2025   Spinal stenosis    Past Surgical History:  Procedure Laterality Date   APPENDECTOMY     COLONOSCOPY WITH ESOPHAGOGASTRODUODENOSCOPY (EGD)     COLOSTOMY     FLEXIBLE SIGMOIDOSCOPY N/A 01/12/2022   Procedure: FLEXIBLE SIGMOIDOSCOPY;  Surgeon: Melvenia Stabs, MD;  Location: [facility];  Service: General;  Laterality: N/A;   LAPAROTOMY N/A 06/27/2021   Procedure: EXPLORATORY LAPAROTOMY, SIGMOID COLECTOMY WITH COLOSTOMY;  Surgeon: Caralyn Chandler, MD;  Location: [facility];  Service: General;  Laterality: N/A;   LYSIS OF ADHESION N/A 01/12/2022   Procedure: LYSIS OF ADHESION;  Surgeon: Melvenia Stabs, MD;  Location: [facility];  Service: General;  Laterality: N/A;   RENAL ANGIOGRAPHY Right 02/05/2019   Procedure: RENAL ANGIOGRAPHY;  Surgeon: Young Hensen, MD;  Location: [facility];  Service: Cardiovascular;  Laterality: Right;   XI ROBOTIC ASSISTED COLOSTOMY TAKEDOWN N/A 01/12/2022   Procedure: XI ROBOTIC ASSISTED  COLOSTOMY TAKEDOWN AND INTRAOPERATIVE ASSESSMENT OF PERFUSION ICG;  Surgeon: Melvenia Stabs, MD;  Location: [facility];  Service: General;  Laterality: N/A;   Patient Active Problem List   Diagnosis Date Noted   Anxiety 07/17/2022   S/P colostomy takedown 01/12/2022   Pneumoperitoneum 06/27/2021   Perforation of sigmoid colon due to diverticulitis 06/27/2021   Renal artery stenosis (HCC) 03/28/2019   Memory change 08/16/2015   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 Sometimes I have to wipe several times due to the stool being softer. Urinary leakage is 40% better.  Fluid intake: water   PAIN:  Are you having pain? No  PRECAUTIONS: Other: Melanoma  RED FLAGS: None   WEIGHT BEARING RESTRICTIONS: No  FALLS:  Has patient fallen in last 6 months? No  OCCUPATION: retired  ACTIVITY LEVEL : exercise class 2 days per week, walks, go to the [employer]  PLOF: Independent  PATIENT GOALS: reduce leakage to not wear pads  PERTINENT HISTORY:  Appendectomy; exploratory laparotomy sigmoid colectomy with colostomy 06/27/2021; robotic assisted colostomy takedown 01/12/2022; Melanoma; Memory change Sexual abuse: No  BOWEL MOVEMENT:  Pain with bowel movement: No  URINATION: Pain with urination: No Fully empty bladder: Yes:   Stream:  Strong Urgency: Yes , sometimes Frequency: average Leakage: Urge to void, Walking to the bathroom, and Sneezing Pads: Yes: when at home she will wear a pad and it happens more at the house. 2-3 pads and pads are damp  INTERCOURSE: not active   PREGNANCY: Vaginal deliveries 2, 1 child and then set of twins   PROLAPSE: None   OBJECTIVE:  Note: Objective measures were completed at Evaluation unless otherwise noted.  DIAGNOSTIC FINDINGS:  none  PATIENT SURVEYS:  PFIQ-7: 6 UIQ-7: 10 COGNITION: Overall cognitive status: Within functional limits for tasks assessed     SENSATION: Light touch: Appears intact    LUMBARAROM/PROM: lumbar ROM decreased by 25%   LOWER EXTREMITY ROM:  Passive ROM Right eval Left eval  Hip external rotation 60 60   (Blank rows = not tested)  LOWER EXTREMITY MMT:  MMT Right eval Left eval  Hip abduction 4/5 4/5   (Blank rows = not tested) PALPATION:   Abdominal: lower abdominal scar has restrictions, difficulty with contracting the lower abdominals, difficulty with performing diaphragmatic breathing.                Patient confirms identification and approves PT to assess internal pelvic floor and treatment No  PELVIC MMT: Patient is not comfortable with internal muscle assessment so therapist did the  RUSI : the ultrasound with gel above the pubic bone on pelvic floor program: Patient will move bladder down 7 mm and not able to contract the pelvic floor. She is not able to move the pelvic floor upward and will bulge her abdomen putting pressure on the bladder   MMT eval  Vaginal   (Blank rows = not tested)         TODAY'S TREATMENT:   10/17/23 Manual: Scar tissue mobilization: Educated patient on scar massage and how to perform at home Manual work to the lower abdominal scar directly and indirectly Neuromuscular re-education: Core retraining: Core facilitation: Supine transverse abdominus with breathing and using her hands to  feel the abdominals sink down 10 x  Supine marching with engagement of the lower abdominals 10 x  Quadruped lift opposite arm and leg with abdominal contraction 10 x each side Pallof with green band 15 x with abdominal contraction.     10/08/23 Neuromuscular re-education: Core facilitation: V sit and hold thighs while going back and forth to strengthen the lower abdominals Supine with feet on stool, hip flexion isometric to engage the lower abdominals Supine with feet on stool, hip flexion diagonal resistance to engage the obliques Pallof with green band and therapist giving tactile cues in the abdomen Standing chop with green band and breath to engage the obliques Bilateral shoulder  extension to engage the lower abdominals Down training: Diaphragmatic breathing with weight on the abdomen to move the pelvic floor but too difficult for patient to understand.  Self-care: Educated patient on how to perform scar massage to the abdomen and patient returned demonstration    10/01/23 Neuromuscular re-education: Core retraining: Pallof with red band and therapist giving tactile cues in the abdomen  Core facilitation: RUSI using the diaphragm setting with ultrasound head on the right diaphragm looking at it move downward with diaphragmatic breathing and therapist hands on the rib cage feeling the expansion RUSI with ultrasound head above the pubic bone on the pelvic floor setting working on contracting the pelvic floor instead of bulging and pushing her abdomen outward. Moved the pelvic floor 5 mm.     09/19/23 Manual: Scar tissue mobilization: Manual work to the lower abdominal scar to improve the mobility Pull up on the scar and mobilization  Educated patient on how to work on her scar at home with the different motions Neuromuscular re-education: Form correction: Transverse abdominus contraction in supine  on contraction instead of bulging Hip flexion isometric with good engagement of  the lower abdomen with her hand on the abdomen to prevent it from coming outward Quadruped abdominal contraction with tactile cues to pull the abdomen upward Exercises: Strengthening: Nustep level 2 for 6 minutes while assessing patient.    PATIENT EDUCATION:  10/17/23 Education details: Access Code: [email or web address] Person educated: Patient Education method: Explanation, Demonstration, Tactile cues, Verbal cues, and Handouts Education comprehension: verbalized understanding, returned demonstration, verbal cues required, tactile cues required, and needs further education  HOME EXERCISE PROGRAM: 10/17/23 Access Code: [email or web address] URL: [email or web address] Date: 10/17/2023 Prepared by: Marsha Skeen  Exercises - Hooklying Transversus Abdominis Palpation  - 1 x daily - 7 x weekly - 1 sets - 10 reps - Standing Anti-Rotation Press with Anchored Resistance  - 1 x daily - 3 x weekly - 1 sets - 10 reps - V-Sit with Heels on Ground  - 1 x daily - 2 x weekly - 1 sets - 10 reps - Supine March with Posterior Pelvic Tilt  - 1 x daily - 2 x weekly - 2 sets - 10 reps - Quadruped Pelvic Floor Contraction with Opposite Arm and Leg Lift  - 1 x daily - 2 x weekly - 1 sets - 10 reps  ASSESSMENT:  CLINICAL IMPRESSION: Patient is a 73 y.o. female who was seen today for physical therapy  treatment for mixed incontinence.  Pads are still damp but urinary leakage is 40% better. Patient understands how to perform scar massage. She is able to contract the upper and lower abdominals equally.  She is wiping her after a stool movement more often due to stool being softer. Patient will benefit from skilled therapy to improve pelvic floor coordination and strength to reduce urinary leakage.    OBJECTIVE IMPAIRMENTS: decreased activity tolerance, decreased coordination, and decreased strength.   ACTIVITY LIMITATIONS: continence  PARTICIPATION LIMITATIONS: cleaning  PERSONAL FACTORS: 1-2 comorbidities:  Appendectomy; exploratory laparotomy sigmoid colectomy with colostomy 06/27/2021; robotic assisted colostomy takedown 01/12/2022; Melanoma; Memory change are also affecting patient's functional outcome.   REHAB POTENTIAL: Good  CLINICAL DECISION MAKING: Evolving/moderate complexity  EVALUATION COMPLEXITY: Moderate   GOALS: Goals reviewed with patient? Yes  SHORT TERM GOALS: Target date: 10/05/23  Patient is independent with lower abdominal contraction.  Baseline: Goal status: Met 09/19/23  2.  Patient understands how to perform scar massage to improve tissue mobility around the bladder.  Baseline:  Goal status: met 09/19/23  3.  Patient able to contract the lower abdominals correctly without bulging them.  Baseline:  Goal status: Met 10/17/23   LONG TERM GOALS: Target date: 11/30/23  Patient independent with advanced HEP for core and pelvic floor strengthening.  Baseline:  Goal status: INITIAL  2.  Patient is able to move the pelvic floor upward >/= 4 mm with the RUSI to reduce urinary leakage.  Baseline:  Goal status: INITIAL  3.  Patient reports she is only using 1 pad per day due to just in case due to the reduction of urinary leakage.  Baseline:  Goal status: INITIAL  4.  Patient UIQ-7 score decreased from 10 to 3 due to reduction of urinary leakage and her frustration with it.  Baseline:  Goal status: INITIAL   PLAN:  PT FREQUENCY: 1x/week  PT DURATION: 12 weeks  PLANNED INTERVENTIONS: 97110-Therapeutic exercises, 97530- Therapeutic activity, 97112- Neuromuscular re-education, 97535- Self Care, 46962- Manual therapy, Patient/Family education, Scar mobilization, and Biofeedback  PLAN FOR NEXT SESSION:  lower abdominal contraction, core work, update HEP  Marsha Skeen, PT 10/17/23 9:28 AM

## 2023-10-19 DIAGNOSIS — H04123 Dry eye syndrome of bilateral lacrimal glands: Secondary | ICD-10-CM | POA: Diagnosis not present

## 2023-10-24 ENCOUNTER — Encounter: Payer: Self-pay | Admitting: Physical Therapy

## 2023-10-24 ENCOUNTER — Ambulatory Visit: Payer: Self-pay | Admitting: Physical Therapy

## 2023-10-24 DIAGNOSIS — R278 Other lack of coordination: Secondary | ICD-10-CM | POA: Diagnosis not present

## 2023-10-24 DIAGNOSIS — M6281 Muscle weakness (generalized): Secondary | ICD-10-CM

## 2023-10-24 NOTE — Therapy (Signed)
 OUTPATIENT PHYSICAL THERAPY FEMALE PELVIC TREATMENT   Patient Name: Carla Ramirez MRN: [id number] DOB:10/08/1950, 73 y.o., female Today's Date: 10/24/2023  END OF SESSION:  PT End of Session - 10/24/23 1532     Visit Number 7    Date for PT Re-Evaluation 11/30/23    Authorization Type [employer]    Authorization Time Period 09/07/2023 - 11/30/2023    Authorization - Visit Number 7    Authorization - Number of Visits 8    Progress Note Due on Visit 10    PT Start Time 1530    PT Stop Time 1610    PT Time Calculation (min) 40 min    Activity Tolerance Patient tolerated treatment well    Behavior During Therapy [facility] for tasks assessed/performed             Past Medical History:  Diagnosis Date   Acid reflux    Anxiety    Arthritis    Diverticulosis    History of hiatal hernia    Hypertension    Iron deficiency anemia 03/19/2014   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  Dr. Lomax follows.   Memory change 08/16/2015   Osteopenia 2025   Spinal stenosis    Past Surgical History:  Procedure Laterality Date   APPENDECTOMY     COLONOSCOPY WITH ESOPHAGOGASTRODUODENOSCOPY (EGD)     COLOSTOMY     FLEXIBLE SIGMOIDOSCOPY N/A 01/12/2022   Procedure: FLEXIBLE SIGMOIDOSCOPY;  Surgeon: Melvenia Stabs, MD;  Location: [facility];  Service: General;  Laterality: N/A;   LAPAROTOMY N/A 06/27/2021   Procedure: EXPLORATORY LAPAROTOMY, SIGMOID COLECTOMY WITH COLOSTOMY;  Surgeon: Caralyn Chandler, MD;  Location: [facility];  Service: General;  Laterality: N/A;   LYSIS OF ADHESION N/A 01/12/2022   Procedure: LYSIS OF ADHESION;  Surgeon: Melvenia Stabs, MD;  Location: [facility];  Service: General;  Laterality: N/A;   RENAL ANGIOGRAPHY Right 02/05/2019   Procedure: RENAL ANGIOGRAPHY;  Surgeon: Young Hensen, MD;  Location: [facility];  Service: Cardiovascular;  Laterality: Right;   XI ROBOTIC ASSISTED COLOSTOMY TAKEDOWN N/A 01/12/2022   Procedure: XI ROBOTIC ASSISTED  COLOSTOMY TAKEDOWN AND INTRAOPERATIVE ASSESSMENT OF PERFUSION ICG;  Surgeon: Melvenia Stabs, MD;  Location: [facility];  Service: General;  Laterality: N/A;   Patient Active Problem List   Diagnosis Date Noted   Anxiety 07/17/2022   S/P colostomy takedown 01/12/2022   Pneumoperitoneum 06/27/2021   Perforation of sigmoid colon due to diverticulitis 06/27/2021   Renal artery stenosis (HCC) 03/28/2019   Memory change 08/16/2015   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 My bowel movements are more normal.   PAIN:  Are you having pain? No  PRECAUTIONS: Other: Melanoma  RED FLAGS: None   WEIGHT BEARING RESTRICTIONS: No  FALLS:  Has patient fallen in last 6 months? No  OCCUPATION: retired  ACTIVITY LEVEL : exercise class 2 days per week, walks, go to the [employer]  PLOF: Independent  PATIENT GOALS: reduce leakage to not wear pads  PERTINENT HISTORY:  Appendectomy; exploratory laparotomy sigmoid colectomy with colostomy 06/27/2021; robotic assisted colostomy takedown 01/12/2022; Melanoma; Memory change Sexual abuse: No  BOWEL MOVEMENT:  Pain with bowel movement: No  URINATION: Pain with urination: No Fully empty bladder: Yes:   Stream: Strong Urgency: Yes , sometimes Frequency: average Leakage: Urge to void, Walking to  the bathroom, and Sneezing Pads: Yes: when at home she will wear a pad and it happens more at the house. 2-3 pads and pads are damp  INTERCOURSE: not active   PREGNANCY: Vaginal deliveries 2, 1 child and then set of twins   PROLAPSE: None   OBJECTIVE:  Note: Objective measures were completed at Evaluation unless otherwise noted.  DIAGNOSTIC FINDINGS:  none  PATIENT SURVEYS:  PFIQ-7: 6 UIQ-7: 10 COGNITION: Overall cognitive status: Within functional limits for tasks assessed     SENSATION: Light touch: Appears intact    LUMBARAROM/PROM: lumbar ROM decreased by 25%   LOWER EXTREMITY ROM:  Passive ROM Right eval Left eval  Hip external rotation 60 60   (Blank rows = not tested)  LOWER EXTREMITY MMT:  MMT Right eval Left eval  Hip abduction 4/5 4/5   (Blank rows = not tested) PALPATION:   Abdominal: lower abdominal scar has restrictions, difficulty with contracting the lower abdominals, difficulty with performing diaphragmatic breathing.                Patient confirms identification and approves PT to assess internal pelvic floor and treatment No  PELVIC MMT: Patient is not comfortable with internal muscle assessment so therapist did the  RUSI : the ultrasound with gel above the pubic bone on pelvic floor program: Patient will move bladder down 7 mm and not able to contract the pelvic floor. She is not able to move the pelvic floor upward and will bulge her abdomen putting pressure on the bladder   MMT eval  Vaginal   (Blank rows = not tested)         TODAY'S TREATMENT:   10/24/23 Exercises: Strengthening: Quadruped lift opposite arm and leg with abdominal contraction 10 x each side Prone hip extension 10 x each leg Lay on side hip adduction 10 x each leg x 2 Supine knees at 90/90 and alternate shoulder flexion with resistance Supine alternate knee and shoulder flexion with resistance  Side lunge with foot on round side of BOSU ball 15 x each side     10/17/23 Manual: Scar tissue mobilization: Educated patient on scar massage and how to perform at home Manual work to the lower abdominal scar directly and indirectly Neuromuscular re-education: Core facilitation: Supine transverse abdominus with breathing and using her hands to feel the abdominals sink down 10 x  Supine marching with engagement of the lower abdominals 10 x  Quadruped lift opposite arm and leg with abdominal contraction 10 x each side Pallof with green band 15 x with abdominal contraction.     10/08/23 Neuromuscular re-education: Core facilitation: V sit and hold thighs while going back and forth to  strengthen the lower abdominals Supine with feet on stool, hip flexion isometric to engage the lower abdominals Supine with feet on stool, hip flexion diagonal resistance to engage the obliques Pallof with green band and therapist giving tactile cues in the abdomen Standing chop with green band and breath to engage the obliques Bilateral shoulder extension to engage the lower abdominals Down training: Diaphragmatic breathing with weight on the abdomen to move the pelvic floor but too difficult for patient to understand.  Self-care: Educated patient on how to perform scar massage to the abdomen and patient returned demonstration     PATIENT EDUCATION:  10/17/23 Education details: Access Code: [email or web address] Person educated: Patient Education method: Explanation, Demonstration, Tactile cues, Verbal cues, and Handouts Education comprehension: verbalized understanding, returned demonstration, verbal cues required, tactile cues required, and needs further education  HOME EXERCISE PROGRAM: 10/17/23 Access Code: [email or web address] URL: [email or web address] Date: 10/17/2023 Prepared by: Marsha Skeen  Exercises - Hooklying Transversus Abdominis Palpation  - 1 x daily - 7 x weekly - 1 sets - 10 reps - Standing Anti-Rotation Press with Anchored Resistance  - 1 x daily - 3 x  weekly - 1 sets - 10 reps - V-Sit with Heels on Ground  - 1 x daily - 2 x weekly - 1 sets - 10 reps - Supine March with Posterior Pelvic Tilt  - 1 x daily - 2 x weekly - 2 sets - 10 reps - Quadruped Pelvic Floor Contraction with Opposite Arm and Leg Lift  - 1 x daily - 2 x weekly - 1 sets - 10 reps  ASSESSMENT:  CLINICAL IMPRESSION: Patient is a 73 y.o. female who was seen today for physical therapy  treatment for mixed incontinence. She went 2 different days without wearing a pad.  She is able to engage her lower abdominal better with her exercises today. She did not leak urine with the challenging exericses. Patient will benefit from skilled therapy to improve pelvic floor coordination and strength to reduce urinary leakage.    OBJECTIVE IMPAIRMENTS: decreased activity tolerance, decreased coordination, and decreased strength.   ACTIVITY LIMITATIONS: continence  PARTICIPATION LIMITATIONS: cleaning  PERSONAL FACTORS: 1-2 comorbidities: Appendectomy; exploratory laparotomy sigmoid colectomy with colostomy 06/27/2021; robotic assisted colostomy takedown 01/12/2022; Melanoma; Memory change are also affecting patient's functional outcome.   REHAB POTENTIAL: Good  CLINICAL DECISION MAKING: Evolving/moderate complexity  EVALUATION COMPLEXITY: Moderate   GOALS: Goals reviewed with patient? Yes  SHORT TERM GOALS: Target date: 10/05/23  Patient is independent with lower abdominal contraction.  Baseline: Goal status: Met 09/19/23  2.  Patient understands how to perform scar massage to improve tissue mobility around the bladder.  Baseline:  Goal status: met 09/19/23  3.  Patient able to contract the lower abdominals correctly without bulging them.  Baseline:  Goal status: Met 10/17/23   LONG TERM GOALS: Target date: 11/30/23  Patient independent with advanced HEP for core and pelvic floor strengthening.  Baseline:  Goal status: INITIAL  2.  Patient is able to move the pelvic floor  upward >/= 4 mm with the RUSI to reduce urinary leakage.  Baseline:  Goal status: INITIAL  3.  Patient reports she is only using 1 pad per day due to just in case due to the reduction of urinary leakage.  Baseline:  Goal status: INITIAL  4.  Patient UIQ-7 score decreased from 10 to 3 due to reduction of urinary leakage and her frustration with it.  Baseline:  Goal status: INITIAL   PLAN:  PT  FREQUENCY: 1x/week  PT DURATION: 12 weeks  PLANNED INTERVENTIONS: 97110-Therapeutic exercises, 97530- Therapeutic activity, 97112- Neuromuscular re-education, 97535- Self Care, [id number]- Manual therapy, Patient/Family education, Scar mobilization, and Biofeedback  PLAN FOR NEXT SESSION:  lower abdominal contraction, core work, update HEP; RUSI to see the excursion of the pelvic floor  Marsha Skeen, PT 10/24/23 4:09 PM

## 2023-11-05 ENCOUNTER — Ambulatory Visit: Attending: Obstetrics and Gynecology | Admitting: Physical Therapy

## 2023-11-05 ENCOUNTER — Encounter: Payer: Self-pay | Admitting: Physical Therapy

## 2023-11-05 DIAGNOSIS — M6281 Muscle weakness (generalized): Secondary | ICD-10-CM | POA: Diagnosis not present

## 2023-11-05 DIAGNOSIS — R278 Other lack of coordination: Secondary | ICD-10-CM | POA: Diagnosis not present

## 2023-11-05 NOTE — Therapy (Signed)
 OUTPATIENT PHYSICAL THERAPY FEMALE PELVIC TREATMENT   Patient Name: Carla Ramirez MRN: [id number] DOB:[birth year], 73 y.o., female Today's Date: 11/05/2023  END OF SESSION:  PT End of Session - 11/05/23 0933     Visit Number 8    Date for PT Re-Evaluation 11/30/23    Authorization Type [place]    Authorization Time Period 09/07/2023 - 11/30/2023    Authorization - Visit Number 8    Authorization - Number of Visits 8    Progress Note Due on Visit 10    PT Start Time 0930    PT Stop Time 1010    PT Time Calculation (min) 40 min    Activity Tolerance Patient tolerated treatment well    Behavior During Therapy [facility] for tasks assessed/performed             Past Medical History:  Diagnosis Date   Acid reflux    Anxiety    Arthritis    Diverticulosis    History of hiatal hernia    Hypertension    Iron deficiency anemia 03/19/2014   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  Dr. Lomax follows.   Memory change 08/16/2015   Osteopenia 2025   Spinal stenosis    Past Surgical History:  Procedure Laterality Date   APPENDECTOMY     COLONOSCOPY WITH ESOPHAGOGASTRODUODENOSCOPY (EGD)     COLOSTOMY     FLEXIBLE SIGMOIDOSCOPY N/A 01/12/2022   Procedure: FLEXIBLE SIGMOIDOSCOPY;  Surgeon: Melvenia Stabs, MD;  Location: [facility];  Service: General;  Laterality: N/A;   LAPAROTOMY N/A 06/27/2021   Procedure: EXPLORATORY LAPAROTOMY, SIGMOID COLECTOMY WITH COLOSTOMY;  Surgeon: Caralyn Chandler, MD;  Location: [facility];  Service: General;  Laterality: N/A;   LYSIS OF ADHESION N/A 01/12/2022   Procedure: LYSIS OF ADHESION;  Surgeon: Melvenia Stabs, MD;  Location: [facility];  Service: General;  Laterality: N/A;   RENAL ANGIOGRAPHY Right 02/05/2019   Procedure: RENAL ANGIOGRAPHY;  Surgeon: Young Hensen, MD;  [facility];  Service: Cardiovascular;  Laterality: Right;   XI ROBOTIC ASSISTED COLOSTOMY TAKEDOWN N/A 01/12/2022   Procedure: XI ROBOTIC ASSISTED  COLOSTOMY TAKEDOWN AND INTRAOPERATIVE ASSESSMENT OF PERFUSION ICG;  Surgeon: Melvenia Stabs, MD;  Location: [facility];  Service: General;  Laterality: N/A;   Patient Active Problem List   Diagnosis Date Noted   Anxiety 07/17/2022   S/P colostomy takedown 01/12/2022   Pneumoperitoneum 06/27/2021   Perforation of sigmoid colon due to diverticulitis 06/27/2021   Renal artery stenosis (HCC) 03/28/2019   Memory change 08/16/2015   Leukopenia 03/19/2014   Iron deficiency anemia 03/19/2014   Essential hypertension, benign 02/05/2014   Hemorrhoids 02/05/2014   Acid reflux 02/21/2013    PCP: Olin Bertin, MD  REFERRING PROVIDER: Greta Leatherwood, MD   REFERRING DIAG: [phone] (ICD-10-CM) - Mixed incontinence   THERAPY DIAG:  Muscle weakness (generalized)  Other lack of coordination  Rationale for Evaluation and Treatment: Rehabilitation  ONSET DATE: 2020  SUBJECTIVE:  SUBJECTIVE STATEMENT: I think I am doing well. I went to the beach and did not wear pads and not having accidents.   PAIN:  Are you having pain? No  PRECAUTIONS: Other: Melanoma  RED FLAGS: None   WEIGHT BEARING RESTRICTIONS: No  FALLS:  Has patient fallen in last 6 months? No  OCCUPATION: retired  ACTIVITY LEVEL : exercise class 2 days per week, walks, go to the [employer]  PLOF: Independent  PATIENT GOALS: reduce leakage to not wear pads  PERTINENT HISTORY:  Appendectomy; exploratory laparotomy sigmoid colectomy with colostomy 06/27/2021; robotic assisted colostomy takedown 01/12/2022; Melanoma; Memory change Sexual abuse: No  BOWEL MOVEMENT:  Pain with bowel movement: No  URINATION: Pain with urination: No Fully empty bladder: Yes:   Stream: Strong Urgency: Yes , sometimes Frequency: average Leakage:  Urge to void, Walking to the bathroom, and Sneezing Pads: Yes: when at home she will wear a pad and it happens more at the house. 2-3 pads and pads are damp  INTERCOURSE: not active   PREGNANCY: Vaginal deliveries 2, 1 child and then set of twins   PROLAPSE: None   OBJECTIVE:  Note: Objective measures were completed at Evaluation unless otherwise noted.  DIAGNOSTIC FINDINGS:  none  PATIENT SURVEYS:  PFIQ-7: 6 UIQ-7: 10 11/05/23: PFIQ-7: 0 UIQ-7: 0 COGNITION: Overall cognitive status: Within functional limits for tasks assessed     SENSATION: Light touch: Appears intact    LUMBARAROM/PROM: lumbar ROM decreased by 25%   LOWER EXTREMITY ROM:  Passive ROM Right eval Left eval  Hip external rotation 60 60   (Blank rows = not tested)  LOWER EXTREMITY MMT:  MMT Right eval Left eval  Hip abduction 4/5 4/5   (Blank rows = not tested) PALPATION:   Abdominal: lower abdominal scar has restrictions, difficulty with contracting the lower abdominals, difficulty with performing diaphragmatic breathing.                Patient confirms identification and approves PT to assess internal pelvic floor and treatment No  PELVIC MMT: Patient is not comfortable with internal muscle assessment so therapist did the  RUSI : the ultrasound with gel above the pubic bone on pelvic floor program: Patient will move bladder down 7 mm and not able to contract the pelvic floor. She is not able to move the pelvic floor upward and will bulge her abdomen putting pressure on the bladder   MMT eval  Vaginal   (Blank rows = not tested)         TODAY'S TREATMENT:   11/05/23 Manual: Soft tissue mobilization: Scar tissue mobilization: Myofascial release: Spinal mobilization: Internal pelvic floor techniques: Dry needling: Neuromuscular re-education: Core retraining: Core facilitation: Form correction: Pelvic floor contraction training: Using the RUSI on the pelvic floor setting and  ultrasound head on the lower abdomen looking at the pelvic floor contraction with not bulging and tightening the lower abdomen. Patient also needed tactile cues to the pelvic floor outside her pants to assist. She moved the pelvic floor 9 mm.  Down training: Exercises: Stretches/mobility: Strengthening: Supine marching with engagement of the lower abdominals 10 x  V sit up with leaning back to engage the abdominals 10 x Quadruped lift opposite arm and leg with pelvic floor contraction 15 x each side Pallof with red band 20 x each way Therapeutic activities: Functional strengthening activities: Self-care:     10/24/23 Exercises: Strengthening: Quadruped lift opposite arm and leg with abdominal contraction 10 x each side Prone hip extension  10 x each leg Lay on side hip adduction 10 x each leg x 2 Supine knees at 90/90 and alternate shoulder flexion with resistance Supine alternate knee and shoulder flexion with resistance  Side lunge with foot on round side of BOSU ball 15 x each side    10/17/23 Manual: Scar tissue mobilization: Educated patient on scar massage and how to perform at home Manual work to the lower abdominal scar directly and indirectly Neuromuscular re-education: Core facilitation: Supine transverse abdominus with breathing and using her hands to feel the abdominals sink down 10 x  Supine marching with engagement of the lower abdominals 10 x  Quadruped lift opposite arm and leg with abdominal contraction 10 x each side Pallof with green band 15 x with abdominal contraction.      PATIENT EDUCATION:  10/17/23 Education details: Access Code: [email or web address] Person educated: Patient Education method: Explanation, Demonstration, Tactile cues, Verbal cues, and Handouts Education comprehension: verbalized understanding, returned demonstration, verbal cues required, tactile cues required, and needs further education  HOME EXERCISE PROGRAM: 10/17/23 Access Code:  [email or web address] URL: [email or web address] Date: 11/05/2023 Prepared by: Marsha Skeen  Exercises - Hooklying Transversus Abdominis Palpation  - 1 x daily - 7 x weekly - 1 sets - 10 reps - Standing Anti-Rotation Press with Anchored Resistance  - 1 x daily - 3 x weekly - 1 sets - 10 reps - V-Sit with Heels on Ground  - 1 x daily - 2 x weekly - 1 sets - 10 reps - Supine March with Posterior Pelvic Tilt  - 1 x daily - 2 x weekly - 2 sets - 10 reps - Quadruped Pelvic Floor Contraction with Opposite Arm and Leg Lift  - 1 x daily - 2 x weekly - 1 sets - 10 reps  ASSESSMENT:  CLINICAL IMPRESSION: Patient is a 73 y.o. female who was seen today for physical therapy  treatment for mixed incontinence. She wears a pad for just in  case. She is able to more her pelvic floor 9 mm. She is independent with her HEP.  She has not leaked urine since her last visit.   OBJECTIVE IMPAIRMENTS: decreased activity tolerance, decreased coordination, and decreased strength.   ACTIVITY LIMITATIONS: continence  PARTICIPATION LIMITATIONS: cleaning  PERSONAL FACTORS: 1-2 comorbidities: Appendectomy; exploratory laparotomy sigmoid colectomy with colostomy 06/27/2021; robotic assisted colostomy takedown 01/12/2022; Melanoma; Memory change are also affecting patient's functional outcome.   REHAB POTENTIAL: Good  CLINICAL DECISION MAKING: Evolving/moderate complexity  EVALUATION COMPLEXITY: Moderate   GOALS: Goals reviewed with patient? Yes  SHORT TERM GOALS: Target date: 10/05/23  Patient is independent with lower abdominal contraction.  Baseline: Goal status: Met 09/19/23  2.  Patient understands how to perform scar massage to improve tissue mobility around the bladder.  Baseline:  Goal status: met 09/19/23  3.  Patient able to contract the lower abdominals correctly without bulging them.  Baseline:  Goal status: Met 10/17/23   LONG TERM GOALS: Target date: 11/30/23  Patient independent with advanced  HEP for core and pelvic floor strengthening.  Baseline:  Goal status: Met 11/05/23  2.  Patient is able to move the pelvic floor upward >/= 4 mm with the RUSI to reduce urinary leakage.  Baseline:  Goal status: met 11/05/23  3.  Patient reports she is only using 1 pad per day due to just in case due to the reduction of urinary leakage.  Baseline:  Goal status: Met 11/05/23  4.  Patient UIQ-7 score decreased from 10 to 3  due to reduction of urinary leakage and her frustration with it.  Baseline:  Goal status: Met 11/05/23   PLAN: Discharge to HEP this visit   Marsha Skeen, PT 11/05/23 9:33 AM  PHYSICAL THERAPY DISCHARGE SUMMARY  Visits from Start of Care: 8  Current functional level related to goals / functional outcomes: See above.    Remaining deficits: See above.    Education / Equipment: HEP   Patient agrees to discharge. Patient goals were met. Patient is being discharged due to meeting the stated rehab goals. Thank you for the referral.   Marsha Skeen, PT 11/05/23 10:01 AM

## 2023-11-07 ENCOUNTER — Encounter: Payer: Self-pay | Admitting: Neurology

## 2023-11-07 ENCOUNTER — Telehealth: Payer: Self-pay | Admitting: Neurology

## 2023-11-07 ENCOUNTER — Ambulatory Visit: Admitting: Neurology

## 2023-11-07 VITALS — BP 138/60 | HR 51 | Ht 61.0 in | Wt 142.0 lb

## 2023-11-07 DIAGNOSIS — F419 Anxiety disorder, unspecified: Secondary | ICD-10-CM | POA: Diagnosis not present

## 2023-11-07 DIAGNOSIS — R413 Other amnesia: Secondary | ICD-10-CM

## 2023-11-07 NOTE — Telephone Encounter (Signed)
 Referral for neuropsychology fax to [facility]. Phone: [phone], Fax: [phone]

## 2023-11-07 NOTE — Patient Instructions (Addendum)
 Referral to neuropsychology for further evaluation of memory concern Check lab work today to screen for Alzheimer's proteins Encouraged to stay active, exercise, eat healthy, drink plenty of water , manage vascular risk factors to promote brain health  Follow-up in 6 months or sooner if needed Thanks!!

## 2023-11-07 NOTE — Progress Notes (Signed)
 PATIENT: Carla Ramirez DOB: 10-Feb-1951  REASON FOR VISIT: follow up for memory  HISTORY FROM: patient, alone PRIMARY NEUROLOGIST: Carla Ramirez/Carla Ramirez  ASSESSMENT AND PLAN 73 y.o. year old female   1.  Mild memory disturbance 2.  Anxiety   - MMSE stable 30/30, continued with report of memory decline, reports anxiety is better with counseling - Referral to neuropsychology to better characterize memory deficits - Check ATN, APOE blood work for screening for AD - Next steps: May consider repeat MRI of the brain - Memory concerns started in 2015, established at our office in 2017 - Encouraged to stay active, exercise, eat healthy, drink plenty of water , manage vascular risk factors to promote brain health - Follow-up in 6 months or sooner if needed  I personally spent a total of 40 minutes in the care of the patient today including preparing to see the patient, getting/reviewing separately obtained history, performing a medically appropriate exam/evaluation, counseling and educating, placing orders, referring and communicating with other health care professionals, documenting clinical information in the EHR, communicating results, and coordinating care.  HISTORY OF PRESENT ILLNESS: Today 11/07/23 MMSE 30/30. Still having issue remembering names, names for things. Never been a detailed person, often forget specific details her husband will say. Is going to a Veterinary surgeon. Tried antidepressant, make her feel worse. Counseling is helping. Has cut back on church involvement. Still exercising. No major health issues. Doing PT for pelvic floor therapy. Sleeps well. Memory issues since 2015. Worries her overtime, she feels things are declining, but not reflected in memory score. Her mother died at 80, she had LBD.  Update 07/17/22 SS: MMSE 30/30 today. Feels memory impaired when under stress. Slight forgetfulness, go into room forget what she went for. Drives a car, no issues. She lives by a list and calendar.  Involved in her church, her husband is encouraging her to take a step back. Takes 1/2 xanax  every morning, anxiety is well managed. Getting ready to fly alone in [place]. Goes to exercise class 2 days a weeks, walks on off days.   Update 12/29/21 SS: Carla Ramirez is here today for follow-up.  Has not been seen since January 2022.  MMSE 30/30. Had perforated colon in January 2023. Has diverticulosis. Has colostomy. Was scheduled for reversal few weeks ago, got dehydrated from the prep, had syncopal episodes, the reversal was delayed to 8/17. Feels some brain fog. Anxiety. Hasn't given up anything due to memory. Does get distracted. She drives, manages the household. Involved in church ministries. 1 example, at car dealership, husband asked for card she didn't understand he meant credit card. Uses 1/2 tablet of Xanax  daily. Her mother had LBD and parkinson's.   CT Head 12/16/21 IMPRESSION: 1. Right occipital scalp hematoma and laceration without underlying fracture. 2. No acute intracranial abnormality. 3. Mild white matter disease is mildly advanced for age. This likely reflects the sequela of chronic microvascular ischemia.  Update 06/28/2020 SS: Carla Ramirez is a 73 year old female with history of mild memory disturbance.  Her trouble with memory is exacerbated by anxiety.  She is not on any memory medications.  MMSE 30/30 today.  Over the last year, has not noted any changes to the memory.  At times, she may feel scattered, forget what she went into her room for.  She may have difficulty remembering people's names.  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  Has gone back to  singing in the choir.  She has Xanax  to take if needed for anxiety, rarely has to take.  She did take 1 this morning, she was anxious about the appointment.  She drives a car, does her own ADLs, manages her household.  Has reported some back issues, is seeing  orthopedics.  She will be turning 70 in a few days. Does 2 days a week of exercise group.  Here today for evaluation unaccompanied.  HISTORY 06/26/2019 SS: Carla Ramirez is a 73 year old female with history of mild memory disturbance.  She lives with her husband, performs her own ADLs.  She drives a car without difficulty.  Her trouble with memory is exacerbated by anxiety.  She is not on any memory medication.  She feels her memory is stable, really only has difficulty during times of stress or anxiety.  Due to the pandemic, she really has not had much stress or anxiety lately, therefore her memory has been well.  She does report at times she has difficulty remembering people's names, or may lose her train of thought, but she thinks this may just be normal with age.  She does report that her mother had dementia.  She has Xanax  she may take as needed for anxiety.  Overall, she has been doing well, no new problems or concerns.  She does take medication for blood pressure.  She has been doing a lot of reading, and walks regularly for exercise.  She presents today for follow-up unaccompanied.    REVIEW OF SYSTEMS: Out of a complete 14 system review of symptoms, the patient complains only of the following symptoms, and all other reviewed systems are negative.  See HPI  ALLERGIES: Allergies  Allergen Reactions   Sulfa Antibiotics Swelling    Facial swelling    HOME MEDICATIONS: Outpatient Medications Prior to Visit  Medication Sig Dispense Refill   acetaminophen  (TYLENOL ) 650 MG CR tablet Take 650 mg by mouth at bedtime as needed for pain.     ALPRAZolam  (XANAX ) 0.25 MG tablet Take 0.25 mg by mouth 2 (two) times daily as needed for anxiety.     amLODIPine -Valsartan -HCTZ 5-160-12.5 MG TABS Take 1 tablet by mouth daily.     atenolol  (TENORMIN ) 100 MG tablet Take 100 mg by mouth every evening.  7   Bacillus Coagulans-Inulin (ALIGN PREBIOTIC-PROBIOTIC PO) Take by mouth daily.      Calcium -Cholecalciferol -Zinc  (VIACTIV CALCIUM  IMMUNE) 650-20-5.5 MG-MCG-MG CHEW Chew 1 tablet by mouth daily.     Cholecalciferol  (VITAMIN D3) 50 MCG (2000 UT) capsule Take 2,000 Units by mouth daily.     pantoprazole  (PROTONIX ) 40 MG tablet Take 40 mg by mouth daily.     Propylene Glycol (SYSTANE BALANCE) 0.6 % SOLN Place 1 drop into both eyes daily as needed (dry eyes).     mupirocin ointment (BACTROBAN) 2 % SMARTSIG:sparingly Topical Daily (Patient not taking: Reported on 11/07/2023)     No facility-administered medications prior to visit.    PAST MEDICAL HISTORY: Past Medical History:  Diagnosis Date   Acid reflux    Anxiety    Arthritis    Diverticulosis    History of hiatal hernia    Hypertension    Iron deficiency anemia 03/19/2014   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  Dr. Lomax follows.   Memory change 08/16/2015   Osteopenia 2025   Spinal stenosis     PAST SURGICAL HISTORY: Past Surgical History:  Procedure Laterality Date   APPENDECTOMY  COLONOSCOPY WITH ESOPHAGOGASTRODUODENOSCOPY (EGD)     COLOSTOMY     FLEXIBLE SIGMOIDOSCOPY N/A 01/12/2022   Procedure: FLEXIBLE SIGMOIDOSCOPY;  Surgeon: Melvenia Stabs, MD;  Location: [facility];  Service: General;  Laterality: N/A;   LAPAROTOMY N/A 06/27/2021   Procedure: EXPLORATORY LAPAROTOMY, SIGMOID COLECTOMY WITH COLOSTOMY;  Surgeon: Caralyn Chandler, MD;  Location: [facility];  Service: General;  Laterality: N/A;   LYSIS OF ADHESION N/A 01/12/2022   Procedure: LYSIS OF ADHESION;  Surgeon: Melvenia Stabs, MD;  Location: [facility];  Service: General;  Laterality: N/A;   RENAL ANGIOGRAPHY Right 02/05/2019   Procedure: RENAL ANGIOGRAPHY;  Surgeon: Young Hensen, MD;  [facility];  Service: Cardiovascular;  Laterality: Right;   XI ROBOTIC ASSISTED COLOSTOMY TAKEDOWN N/A 01/12/2022   Procedure: XI ROBOTIC ASSISTED COLOSTOMY TAKEDOWN AND INTRAOPERATIVE ASSESSMENT OF PERFUSION ICG;  Surgeon:  Melvenia Stabs, MD;  Location: [facility];  Service: General;  Laterality: N/A;    FAMILY HISTORY: Family History  Problem Relation Age of Onset   Stroke Mother    Dementia Mother    Lymphoma Brother             SOCIAL HISTORY: Social History   Socioeconomic History   Marital status: Married    Spouse name: Not on file   Number of children: 3   Years of education: Masters   Highest education level: Not on file  Occupational History   Not on file  Tobacco Use   Smoking status: Never   Smokeless tobacco: Never  Vaping Use   Vaping status: Never Used  Substance and Sexual Activity   Alcohol  use: Yes    Comment: 2 glasses of wine/month   Drug use: No   Sexual activity: Not Currently    Partners: Male    Birth control/protection: Post-menopausal    Comment: less than 5, IC after 16, no STD, no abnormal pap, no DES  Other Topics Concern   Not on file  Social History Narrative   Married lives with hsuband at home.  Retired.  Has Masters degree.  Has 3 children.    Right-handed   Caffeine:   Social Drivers of Corporate investment banker Strain: Not on file  Food Insecurity: Not on file  Transportation Needs: Not on file  Physical Activity: Not on file  Stress: Not on file  Social Connections: Not on file  Intimate Partner Violence: Not on file   PHYSICAL EXAM  Vitals:   11/07/23 0843  BP: 138/60  Pulse: (!) 51  SpO2: 98%  Weight: 142 lb (64.4 kg)  Height: 5' 1 (1.549 m)   Body mass index is 26.83 kg/m.  Generalized: Well developed, in no acute distress     11/07/2023    8:49 AM 07/17/2022    9:53 AM 12/29/2021    8:42 AM  MMSE - Mini Mental State Exam  Orientation to time 5 5 5   Orientation to Place 5 5 5   Registration 3 3 3   Attention/ Calculation 5 5 5   Recall 3 3 3   Language- name 2 objects 2 2 2   Language- repeat 1 1 1   Language- follow 3 step command 3 3 3   Language- read & follow direction 1 1 1   Write a sentence 1 1 1   Copy design 1 1 1    Total score 30 30 30    Neurological examination  Mentation: Alert oriented to time, place, history taking. Follows all commands speech and language fluent  Cranial nerve II-XII: Pupils were equal round reactive to light. Extraocular movements were full, visual field were full on confrontational test. Facial sensation and strength were normal.  Head turning and shoulder shrug  were normal and symmetric. Motor: The motor testing reveals 5 over 5 strength of all 4 extremities. Good symmetric motor tone is noted throughout.  Sensory: Sensory testing is intact to soft touch on all 4 extremities. No evidence of extinction is noted.  Coordination: Cerebellar testing reveals good finger-nose-finger and heel-to-shin bilaterally.  Gait and station: Gait is normal, wearing platform sandals  Reflexes: Deep tendon reflexes are symmetric and normal bilaterally.   DIAGNOSTIC DATA (LABS, IMAGING, TESTING) - I reviewed patient records, labs, notes, testing and imaging myself where available.  Lab Results  Component Value Date   WBC 6.5 01/15/2022   HGB 10.9 (L) 01/15/2022   HCT 32.0 (L) 01/15/2022   MCV 93.8 01/15/2022   PLT 220 01/15/2022      Component Value Date/Time   NA 137 01/15/2022 0428   K 4.0 01/15/2022 0428   CL 103 01/15/2022 0428   CO2 29 01/15/2022 0428   GLUCOSE 96 01/15/2022 0428   BUN 19 01/15/2022 0428   CREATININE 0.71 01/15/2022 0428   CALCIUM  8.9 01/15/2022 0428   PROT 7.4 01/02/2022 1347   ALBUMIN 4.0 01/02/2022 1347   AST 18 01/02/2022 1347   ALT 13 01/02/2022 1347   ALKPHOS 91 01/02/2022 1347   BILITOT 0.6 01/02/2022 1347   GFRNONAA >60 01/15/2022 0428   No results found for: CHOL, HDL, LDLCALC, LDLDIRECT, TRIG, CHOLHDL Lab Results  Component Value Date   HGBA1C 5.5 12/07/2021   Lab Results  Component Value Date   VITAMINB12 533 08/16/2015   No results found for: TSH   Jeanmarie Millet, AGNP-C, DNP 11/07/2023, 9:00 AM [facility] [address] [place], [place] [address] [phone]

## 2023-11-12 ENCOUNTER — Encounter: Admitting: Physical Therapy

## 2023-11-12 DIAGNOSIS — F418 Other specified anxiety disorders: Secondary | ICD-10-CM | POA: Diagnosis not present

## 2023-11-13 ENCOUNTER — Telehealth: Payer: Self-pay | Admitting: Neurology

## 2023-11-13 NOTE — Telephone Encounter (Signed)
 Pt will be going out of town on the 19th, she'd like to get a call with results to lab work from the 11th

## 2023-11-13 NOTE — Telephone Encounter (Signed)
 Faxed over  Last 3 clinic Notes ,CT/MRI Brain Results ,H&P,Medication  to [facility]   Fax# [phone]

## 2023-11-13 NOTE — Telephone Encounter (Signed)
 Receive Fax  from [facility]  on 11-13-23 stating  Pt is scheduled   Intake /Testing  : July 25,2025 at 9:00 AM  Interview/ Feedback : August 13,2025 at 11:00 AM   The report will be sent to office following Pt appointments  [facility]   is requesting   Last 3 clinic Notes  CT/MRI Brain Results  H&P Medication

## 2023-11-15 DIAGNOSIS — L820 Inflamed seborrheic keratosis: Secondary | ICD-10-CM | POA: Diagnosis not present

## 2023-11-15 DIAGNOSIS — Z85820 Personal history of malignant melanoma of skin: Secondary | ICD-10-CM | POA: Diagnosis not present

## 2023-11-15 DIAGNOSIS — D2272 Melanocytic nevi of left lower limb, including hip: Secondary | ICD-10-CM | POA: Diagnosis not present

## 2023-11-15 DIAGNOSIS — Z85828 Personal history of other malignant neoplasm of skin: Secondary | ICD-10-CM | POA: Diagnosis not present

## 2023-11-15 DIAGNOSIS — L218 Other seborrheic dermatitis: Secondary | ICD-10-CM | POA: Diagnosis not present

## 2023-11-15 DIAGNOSIS — L720 Epidermal cyst: Secondary | ICD-10-CM | POA: Diagnosis not present

## 2023-11-15 DIAGNOSIS — D2222 Melanocytic nevi of left ear and external auricular canal: Secondary | ICD-10-CM | POA: Diagnosis not present

## 2023-11-15 DIAGNOSIS — L821 Other seborrheic keratosis: Secondary | ICD-10-CM | POA: Diagnosis not present

## 2023-11-15 DIAGNOSIS — L814 Other melanin hyperpigmentation: Secondary | ICD-10-CM | POA: Diagnosis not present

## 2023-11-17 LAB — APOE ALZHEIMER'S RISK

## 2023-11-17 LAB — ATN PROFILE
A -- Beta-amyloid 42/40 Ratio: 0.122 (ref >0.102)
Beta-amyloid 40: 191.71 pg/mL
Beta-amyloid 42: 23.38 pg/mL
N -- NfL, Plasma: 3.01 pg/mL (ref 0.00–6.04)
T -- p-tau181: 0.9 pg/mL (ref 0.00–0.97)

## 2023-11-19 ENCOUNTER — Ambulatory Visit: Payer: Self-pay | Admitting: Neurology

## 2023-11-26 DIAGNOSIS — F418 Other specified anxiety disorders: Secondary | ICD-10-CM | POA: Diagnosis not present

## 2023-11-28 ENCOUNTER — Encounter: Admitting: Physical Therapy

## 2023-12-10 ENCOUNTER — Encounter: Admitting: Physical Therapy

## 2023-12-10 DIAGNOSIS — F418 Other specified anxiety disorders: Secondary | ICD-10-CM | POA: Diagnosis not present

## 2023-12-17 ENCOUNTER — Encounter: Admitting: Physical Therapy

## 2023-12-20 DIAGNOSIS — R413 Other amnesia: Secondary | ICD-10-CM | POA: Diagnosis not present

## 2023-12-24 DIAGNOSIS — F418 Other specified anxiety disorders: Secondary | ICD-10-CM | POA: Diagnosis not present

## 2023-12-26 DIAGNOSIS — R7301 Impaired fasting glucose: Secondary | ICD-10-CM | POA: Diagnosis not present

## 2023-12-26 DIAGNOSIS — I10 Essential (primary) hypertension: Secondary | ICD-10-CM | POA: Diagnosis not present

## 2023-12-26 DIAGNOSIS — F411 Generalized anxiety disorder: Secondary | ICD-10-CM | POA: Diagnosis not present

## 2024-01-09 DIAGNOSIS — R413 Other amnesia: Secondary | ICD-10-CM | POA: Diagnosis not present

## 2024-01-14 DIAGNOSIS — F411 Generalized anxiety disorder: Secondary | ICD-10-CM | POA: Diagnosis not present

## 2024-01-16 ENCOUNTER — Telehealth: Payer: Self-pay | Admitting: Neurology

## 2024-01-16 NOTE — Telephone Encounter (Signed)
 Neuropsychological evaluation with Dr. 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  Given her family history of Lewy body disease remains unclear whether her cognitive changes reflect functional effects of anxiety or early mild neurocognitive decline.  However the chronicity and stability of her cognitive complaints leans slightly more towards a psychiatric and/or functional etiology with possible effect of sedated medication.  Recommendations:  -Sleep evaluation if vocalizations during sleep persists to rule out REM sleep behavior disorder -DaTscan to rule out early signs of parkinsonian disorder such as Lewy body disease given family history -Repeat neuropsychological evaluation in 12 months -Discuss with prescribing provider the risk of chronic Xanax  use -Explore clinical hypnotherapy  Can discuss further at visit in January to see how things are going.

## 2024-02-04 DIAGNOSIS — F418 Other specified anxiety disorders: Secondary | ICD-10-CM | POA: Diagnosis not present

## 2024-02-26 ENCOUNTER — Ambulatory Visit: Admitting: Neurology

## 2024-02-28 ENCOUNTER — Ambulatory Visit: Admitting: Radiology

## 2024-02-28 ENCOUNTER — Encounter: Payer: Self-pay | Admitting: Radiology

## 2024-02-28 VITALS — BP 136/70 | HR 57 | Wt 140.0 lb

## 2024-02-28 DIAGNOSIS — N907 Vulvar cyst: Secondary | ICD-10-CM | POA: Diagnosis not present

## 2024-02-28 DIAGNOSIS — K648 Other hemorrhoids: Secondary | ICD-10-CM | POA: Diagnosis not present

## 2024-02-28 NOTE — Progress Notes (Signed)
   Carla Ramirez April 30, 1951 [id number]   History: Postmenopausal 73 y.o. Complains of tender bump right labia x's 2 days. Area is smaller today. Also complains of hemorrhoids, using OTC suppositories prn. Not currently inflamed. Has itching of the rectum. Uses witch hazel to clean.    Gynecologic History Postmenopausal   Obstetric History OB History  Gravida Para Term Preterm AB Living  2 2 2  0 0 3  SAB IAB Ectopic Multiple Live Births  0 0 0 1 3    # Outcome Date GA Lbr Len/2nd Weight Sex Type Anes PTL Lv  2A Term 07/25/82    F Vag-Spont   LIV  2B Term 07/25/82    M Vag-Spont   LIV  1 Term 10/05/79    M Vag-Spont   LIV      The following portions of the patient's history were reviewed and updated as appropriate: allergies, current medications, past family history, past medical history, past social history, past surgical history, and problem list.  Review of Systems Pertinent items noted in HPI and remainder of comprehensive ROS otherwise negative.  Past medical history, past surgical history, family history and social history were all reviewed and documented in the EPIC chart.  Exam:  Vitals:   02/28/24 0833  BP: 136/70  Pulse: (!) 57  SpO2: 98%  Weight: 140 lb (63.5 kg)   Body mass index is 26.45 kg/m.    Physical Exam Exam conducted with a chaperone present.  Constitutional:      Appearance: Normal appearance. She is normal weight.  Pulmonary:     Effort: Pulmonary effort is normal.  Genitourinary:    General: Normal vulva.     Vagina: Normal.     Rectum: External hemorrhoid and internal hemorrhoid present.      Comments: 6mm resolving cystic area on the perineum. Flat with some erythema, slightly tender to palpation Neurological:     Mental Status: She is alert.  Psychiatric:        Mood and Affect: Mood normal.        Thought Content: Thought content normal.        Judgment: Judgment normal.    Darice Hoit, CMA present for exam   Assessment/Plan:    1. Vulvar cyst (Primary) Resolving, may try hot compresses to heal  2. Other hemorrhoids Continue OTC suppositories prn and witch hazel Clean with a peri bottle after BM and moisturize with A+D or aquaphor to prevent itching    Return in 1 year for annual or sooner prn.  Doc Mandala B WHNP-BC, 8:48 AM 02/28/2024

## 2024-06-11 ENCOUNTER — Encounter: Payer: Self-pay | Admitting: Student in an Organized Health Care Education/Training Program

## 2024-06-11 ENCOUNTER — Ambulatory Visit: Admitting: Student in an Organized Health Care Education/Training Program

## 2024-06-11 VITALS — BP 127/72 | HR 50 | Ht 62.0 in | Wt 142.0 lb

## 2024-06-11 DIAGNOSIS — K2100 Gastro-esophageal reflux disease with esophagitis, without bleeding: Secondary | ICD-10-CM | POA: Diagnosis not present

## 2024-06-11 DIAGNOSIS — F419 Anxiety disorder, unspecified: Secondary | ICD-10-CM

## 2024-06-11 DIAGNOSIS — I701 Atherosclerosis of renal artery: Secondary | ICD-10-CM

## 2024-06-11 DIAGNOSIS — R7989 Other specified abnormal findings of blood chemistry: Secondary | ICD-10-CM

## 2024-06-11 DIAGNOSIS — I480 Paroxysmal atrial fibrillation: Secondary | ICD-10-CM | POA: Insufficient documentation

## 2024-06-11 DIAGNOSIS — I10 Essential (primary) hypertension: Secondary | ICD-10-CM | POA: Diagnosis not present

## 2024-06-11 MED ORDER — METOPROLOL TARTRATE 25 MG PO TABS: ORAL_TABLET | ORAL | 0 refills | Status: AC

## 2024-06-11 NOTE — Patient Instructions (Addendum)
 Medication Instructions:  STOP Aspirin    *If you need a refill on your cardiac medications before your next appointment, please call your pharmacy*  Testing/Procedures: CORONARY CTA    will be scheduled after approved by insurance   Follow instructions below  Your physician has requested that you have cardiac CT. Cardiac computed tomography (CT) is a painless test that uses an x-ray machine to take clear, detailed pictures of your heart. For further information please visit [email or web address] Please follow instruction sheet as given.    Follow-Up: At [facility], you and your health needs are our priority.  As part of our continuing mission to provide you with exceptional heart care, our providers are all part of one team.  This team includes your primary Cardiologist (physician) and Advanced Practice Providers or APPs (Physician Assistants and Nurse Practitioners) who all work together to provide you with the care you need, when you need it.  Your next appointment:   1 year(s)  Provider:   Georganna Archer, MD        Your cardiac CT will be scheduled at one of the below locations:   [facility] [address] [place], [place] [address] [phone] (Severe contrast allergies only)  OR   [facility] [address] [place], [place] [address] [phone]  OR   [facility] [address] [place], [place] [address] [phone]  OR   [facility] and Vascular Tower [address]  [place], [place] [address]  OR   [facility]  [address] [place], [place] [phone]  If scheduled at [facility], please arrive at the [facility] Entrance (Entrance C2) of [facility] 30 minutes prior to test start time. You can use the FREE valet parking offered at entrance C (encouraged to control the heart rate for the test)  Proceed to the [facility] Radiology  Department (first floor) to check-in and test prep.  All radiology patients and guests should use entrance C2 at [facility], accessed from [facility], even though the hospital's physical address listed is [address].  If scheduled at the Heart and Vascular Tower at [address], please enter the parking lot using the [address] entrance and use the FREE valet service at the patient drop-off area. Enter the building and check-in with registration on the main floor.  If scheduled at [facility], please arrive to the Heart and Vascular Center 15 mins early for check-in and test prep.  There is spacious parking and easy access to the radiology department from the [facility] Heart and Vascular entrance. Please enter here and check-in with the desk attendant.   If scheduled at [facility], please arrive 30 minutes early for check-in and test prep.  Please follow these instructions carefully (unless otherwise directed):  An IV will be required for this test and Nitroglycerin will be given.   On the Night Before the Test: Be sure to Drink plenty of water . Do not consume any caffeinated/decaffeinated beverages or chocolate 12 hours prior to your test. Do not take any antihistamines 12 hours prior to your test.    On the Day of the Test: Drink plenty of water  until 1 hour prior to the test. Do not eat any food 1 hour prior to test. You may take your regular medications prior to the test.  Take metoprolol  25 mg two hours prior to test. If you  take Furosemide/Hydrochlorothiazide /Spironolactone/Chlorthalidone, please HOLD on the morning of the test. FEMALES- please wear underwire-free bra if available, avoid dresses & tight clothing       After the Test: Drink plenty of water . After receiving IV contrast, you may experience a mild flushed feeling. This is normal. On occasion, you may experience a mild rash up to 24 hours after  the test. This is not dangerous. If this occurs, you can take Benadryl  25 mg, Zyrtec, Claritin, or Allegra and increase your fluid intake. (Patients taking Tikosyn should avoid Benadryl , and may take Zyrtec, Claritin, or Allegra) If you experience trouble breathing, this can be serious. If it is severe call 911 IMMEDIATELY. If it is mild, please call our office.  We will call to schedule your test 2-4 weeks out understanding that some insurance companies will need an authorization prior to the service being performed.   For more information and frequently asked questions, please visit our website : [email or web address]  For non-scheduling related questions, please contact the cardiac imaging nurse navigator should you have any questions/concerns: Cardiac Imaging Nurse Navigators Direct Office Dial: [phone]   For scheduling needs, including cancellations and rescheduling, please call Brittany, [phone].

## 2024-06-11 NOTE — Progress Notes (Signed)
 " Cardiology Office Note:  .   Date:  06/11/2024  ID:  Carla Ramirez, DOB 1950/06/12, MRN [id number] PCP: Verena Mems, MD  [facility] Providers Cardiologist:  Georganna Archer, MD { Chief Complaint:  Chief Complaint  Patient presents with   Atrial Fibrillation    History of Present Illness: Carla    [name] is a 74 y.o. female with a PMH of recently diagnosed PAF, HTN and cognitive impairment who presents for follow-up.      Patient establish care with me on 06/11/2024 for the evaluation of new onset atrial fibrillation.     Discussed the use of AI scribe software for clinical note transcription with the patient, who gave verbal consent to proceed.  History of Present Illness Carla Ramirez is a 74 year old female with atrial fibrillation who presents for follow-up after a recent emergency department visit.  She experiences symptoms of atrial fibrillation, particularly during periods of stress, such as around Christmas time. Her heart feels 'fluttering' and 'irregular' without associated chest pain, shortness of breath, leg swelling, or syncope. An episode occurred at a grocery store following a family event, where she felt lightheaded and noticed her heart's irregular rhythm, prompting her to seek medical attention.  She presented to the [facility] ED and was found to be in atrial fibrillation with RVR.  Troponins were elevated at 13 -> 34.  She denies having had chest pain, but mostly the palpitations.  She is currently taking Eliquis 5 mg twice daily, diltiazem, and valsartan  HCTZ. She was previously on amlodipine , valsartan , and HCTZ, but adjustments were made due to low blood pressure issues.  Since her hospital discharge, she has not experienced further episodes of heart fluttering or irregular rhythm. She remains physically active, attending a cardio strength class twice a week and walking on alternate days.  Her mother had a stroke, which she is concerned about and wishes  to avoid.   Studies Reviewed: Carla    EKG:  EKG Interpretation Date/Time:  Wednesday June 11 2024 14:15:18 EST Ventricular Rate:  50 PR Interval:  228 QRS Duration:  82 QT Interval:  488 QTC Calculation: 444 R Axis:   -55  Text Interpretation: Sinus bradycardia with 1st degree A-V block Possible Left atrial enlargement Left anterior fascicular block When compared with ECG of 02-Jan-2022 14:13, QRS axis Shifted left Confirmed by Archer Georganna [phone]) on 06/11/2024 2:24:58 PM         Results Labs Troponin: Elevated Renal function: Within normal limits  Diagnostic EKG: Normal sinus rhythm with left anterior fascicular block (Independently interpreted) Echocardiogram: Structurally normal heart, no valvular abnormalities, normal systolic function  Risk Assessment/Calculations:     CHA2DS2-VASc Score = 3   This indicates a 3.2% annual risk of stroke. The patient's score is based upon: CHF History: 0 HTN History: 1 Diabetes History: 0 Stroke History: 0 Vascular Disease History: 0 Age Score: 1 Gender Score: 1              Physical Exam:    VS:  BP 127/72   Pulse (!) 50   Ht 5' 2 (1.575 m)   Wt 142 lb (64.4 kg)   LMP 05/30/2003   SpO2 96%   BMI 25.97 kg/m      Wt Readings from Last 3 Encounters:  02/28/24 140 lb (63.5 kg)  11/07/23 142 lb (64.4 kg)  07/30/23 144 lb (65.3 kg)     GEN: Well nourished, well developed, in no acute distress  NECK: No JVD; No carotid bruits CARDIAC: RRR, no murmurs, rubs, gallops RESPIRATORY:  Clear to auscultation without rales, wheezing or rhonchi  ABDOMEN: Soft, non-tender, non-distended, normal bowel sounds EXTREMITIES:  Warm and well perfused, no edema; No deformity, 2+ radial pulses PSYCH: Normal mood and affect   ASSESSMENT AND PLAN: .    Assessment & Plan  #New PAF on Eliquis #Elevated Troponins -The patient developed new onset atrial fibrillation for which she was hospitalized at [facility] for last  week. -Currently she is in NSR and remains symptom-free at this time. -Her CHA2DS2-VASc score = 3 which necessitates long-term oral anticoagulation for stroke prevention. -She is taking a baby aspirin  daily but will discontinue this while she is on Eliquis. -Lastly, her troponins ruled in during her hospitalization and was favored to be demand ischemia in the setting of RVR.  Given that her troponins were elevated, I will perform a CCTA to rule out struct of coronary disease that may be the underlying substrate for her elevated troponins in the setting of RVR. Continue Eliquis 5 mg twice daily indefinitely Continue diltiazem 180 mg daily CCTA to evaluate for CAD in the setting of recent demand ischemia Stop baby aspirin  Follow-up in 12 months  #HTN - BP is well-controlled.  No changes. Continue valsartan -hydrochlorothiazide  80-12.5 mg daily        This note was written with the assistance of a dictation microphone or AI dictation software. Please excuse any typos or grammatical errors.   Signed, Georganna Archer, MD  06/11/2024 1:02 PM    [facility] "

## 2024-06-12 ENCOUNTER — Ambulatory Visit: Payer: Self-pay | Admitting: Student in an Organized Health Care Education/Training Program

## 2024-06-12 DIAGNOSIS — R7989 Other specified abnormal findings of blood chemistry: Secondary | ICD-10-CM

## 2024-06-12 LAB — BASIC METABOLIC PANEL WITH GFR
BUN/Creatinine Ratio: 20 (ref 12–28)
BUN: 24 mg/dL (ref 8–27)
CO2: 24 mmol/L (ref 20–29)
Calcium: 9.6 mg/dL (ref 8.7–10.3)
Chloride: 98 mmol/L (ref 96–106)
Creatinine, Ser: 1.18 mg/dL — ABNORMAL HIGH (ref 0.57–1.00)
Glucose: 83 mg/dL (ref 70–99)
Potassium: 3.9 mmol/L (ref 3.5–5.2)
Sodium: 139 mmol/L (ref 134–144)
eGFR: 49 mL/min/1.73 — ABNORMAL LOW

## 2024-06-16 ENCOUNTER — Telehealth: Payer: Self-pay | Admitting: Student in an Organized Health Care Education/Training Program

## 2024-06-16 NOTE — Progress Notes (Unsigned)
 "  PATIENT: Carla Ramirez DOB: June 09, 1950  REASON FOR VISIT: follow up for memory  HISTORY FROM: patient, alone PRIMARY NEUROLOGIST: Carla Ramirez  ASSESSMENT AND PLAN 74 y.o. year old female   1.  Mild memory disturbance 2.  Anxiety   - Stable, MMSE 30/30, A-T-N-, E3/E3 - Neuropsychological evaluation with Carla Ramirez, likely more anxiety related along with normal age-related changes, possible subtle mild neurocognitive impairment.  Family history of Lewy body dementia.  - Hold off on DaTscan since no symptoms noted other than mild memory change.  Will order MRI of the brain for comparison to 2017 study  - HST given new onset AFIB in setting of memory issue r/o OSA - Memory concerns started in 2015, established at our office in 2017 - Encouraged to stay active, exercise, eat healthy, drink plenty of water , manage vascular risk factors to promote brain health - Follow-up in 6 months or sooner if needed, plan for repeat neuro psych evaluation in summer 2026  Orders Placed This Encounter  Procedures   MR BRAIN WO CONTRAST   Home sleep test   HISTORY OF PRESENT ILLNESS: Today 06/17/24 06/17/24 SS: Normal ATN, E3/E3.   Neuropsychological evaluation with Carla Ramirez 12/16/2023 overall most consistent with mild cognitive changes likely influenced by significant generalized anxiety with contributions from normal age-related changes and possibly subtle mild neurocognitive impairment.  Given her family history of Lewy body disease remains unclear whether her cognitive changes reflect functional effects of anxiety or early mild neurocognitive decline.  However the chronicity and stability of her cognitive complaints leans slightly more towards a psychiatric and/or functional etiology with possible effect of sedated medication.   Recommendations:   -Sleep evaluation if vocalizations during sleep persists to rule out REM sleep behavior disorder -DaTscan to rule out early signs of parkinsonian  disorder such as Lewy body disease given family history -Repeat neuropsychological evaluation in 12 months -Discuss with prescribing provider the risk of chronic Xanax  use -Explore clinical hypnotherapy  MMSE 30/30 today. Last week new onset AFIB. On Eliquis. Memory getting a little worse overtime. Stress with holidays contributes. Her mother had LBD. Denies nocturnal vocalizations, sleeps very soundly. Denies any tremor, stiffness, trouble walking. Remains on Xanax  up to 2 times daily, usually 1/2 tablet. Still trouble with names, words, trouble with computer. Does not snore. Active, driving, singing in choral group.   11/07/23 SS: MMSE 30/30. Still having issue remembering names, names for things. Never been a detailed person, often forget specific details her husband will say. Is going to a veterinary surgeon. Tried antidepressant, make her feel worse. Counseling is helping. Has cut back on church involvement. Still exercising. No major health issues. Doing PT for pelvic floor therapy. Sleeps well. Memory issues since 2015. Worries her overtime, she feels things are declining, but not reflected in memory score. Her mother died at 57, she had LBD.  Update 07/17/22 SS: MMSE 30/30 today. Feels memory impaired when under stress. Slight forgetfulness, go into room forget what she went for. Drives a car, no issues. She lives by a list and calendar. Involved in her church, her husband is encouraging her to take a step back. Takes 1/2 xanax  every morning, anxiety is well managed. Getting ready to fly alone in [place]. Goes to exercise class 2 days a weeks, walks on off days.   Update 12/29/21 SS: Carla Ramirez is here today for follow-up.  Has not been seen since January 2022.  MMSE 30/30. Had perforated colon in January 2023. Has diverticulosis.  Has colostomy. Was scheduled for reversal few weeks ago, got dehydrated from the prep, had syncopal episodes, the reversal was delayed to 8/17. Feels some brain fog. Anxiety.  Hasn't given up anything due to memory. Does get distracted. She drives, manages the household. Involved in church ministries. 1 example, at car dealership, husband asked for card she didn't understand he meant credit card. Uses 1/2 tablet of Xanax  daily. Her mother had LBD and parkinson's.   CT Head 12/16/21 IMPRESSION: 1. Right occipital scalp hematoma and laceration without underlying fracture. 2. No acute intracranial abnormality. 3. Mild white matter disease is mildly advanced for age. This likely reflects the sequela of chronic microvascular ischemia.  Update 06/28/2020 SS: Carla Ramirez is a 74 year old female with history of mild memory disturbance.  Her trouble with memory is exacerbated by anxiety.  She is not on any memory medications.  MMSE 30/30 today.  Over the last year, has not noted any changes to the memory.  At times, she may feel scattered, forget what she went into her room for.  She may have difficulty remembering people's names.  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  Has gone back to singing in the choir.  She has Xanax  to take if needed for anxiety, rarely has to take.  She did take 1 this morning, she was anxious about the appointment.  She drives a car, does her own ADLs, manages her household.  Has reported some back issues, is seeing orthopedics.  She will be turning 70 in a few days. Does 2 days a week of exercise group.  Here today for evaluation unaccompanied.  HISTORY 06/26/2019 SS: Carla Ramirez is a 74 year old female with history of mild memory disturbance.  She lives with her husband, performs her own ADLs.  She drives a car without difficulty.  Her trouble with memory is exacerbated by anxiety.  She is not on any memory medication.  She feels her memory is stable, really only has difficulty during times of stress or anxiety.  Due to the pandemic, she really has not had much stress or anxiety lately,  therefore her memory has been well.  She does report at times she has difficulty remembering people's names, or may lose her train of thought, but she thinks this may just be normal with age.  She does report that her mother had dementia.  She has Xanax  she may take as needed for anxiety.  Overall, she has been doing well, no new problems or concerns.  She does take medication for blood pressure.  She has been doing a lot of reading, and walks regularly for exercise.  She presents today for follow-up unaccompanied.    REVIEW OF SYSTEMS: Out of a complete 14 system review of symptoms, the patient complains only of the following symptoms, and all other reviewed systems are negative.  See HPI  ALLERGIES: Allergies  Allergen Reactions   Sulfa Antibiotics Swelling    Facial swelling    HOME MEDICATIONS: Outpatient Medications Prior to Visit  Medication Sig Dispense Refill   acetaminophen  (TYLENOL ) 650 MG CR tablet Take 650 mg by mouth at bedtime as needed for pain.     ALPRAZolam  (XANAX ) 0.25 MG tablet Take 0.25 mg by mouth 2 (two) times daily as needed for anxiety.     apixaban (ELIQUIS) 5 MG TABS tablet Take 5 mg by mouth 2 (two) times daily.     atenolol  (  TENORMIN ) 100 MG tablet Take 100 mg by mouth every evening.  7   Bacillus Coagulans-Inulin (ALIGN PREBIOTIC-PROBIOTIC PO) Take by mouth daily.     Calcium -Cholecalciferol -Zinc  (VIACTIV CALCIUM  IMMUNE) 650-20-5.5 MG-MCG-MG CHEW Chew 1 tablet by mouth daily.     Cholecalciferol  (VITAMIN D3) 50 MCG (2000 UT) capsule Take 2,000 Units by mouth daily.     diltiazem (CARDIZEM LA) 180 MG 24 hr tablet Take 180 mg by mouth daily.     fluorouracil (EFUDEX) 5 % cream Apply 1 Application topically daily.     metoprolol  tartrate (LOPRESSOR ) 25 MG tablet Take Metoprolol  25 mg 2 hours before Coronary CT 1 tablet 0   mupirocin ointment (BACTROBAN) 2 % SMARTSIG:sparingly Topical Daily     pantoprazole  (PROTONIX ) 40 MG tablet Take 40 mg by mouth daily.      Propylene Glycol (SYSTANE BALANCE) 0.6 % SOLN Place 1 drop into both eyes daily as needed (dry eyes).     valsartan -hydrochlorothiazide  (DIOVAN -HCT) 80-12.5 MG tablet Take 1 tablet by mouth daily.     aspirin  EC 81 MG tablet Take 81 mg by mouth daily. (Patient not taking: Reported on 06/17/2024)     No facility-administered medications prior to visit.    PAST MEDICAL HISTORY: Past Medical History:  Diagnosis Date   Acid reflux    Anxiety    Arthritis    Diverticulosis    History of hiatal hernia    Hypertension    Iron deficiency anemia 03/19/2014   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  Dr. Lomax follows.   Memory change 08/16/2015   Osteopenia 2025   Spinal stenosis     PAST SURGICAL HISTORY: Past Surgical History:  Procedure Laterality Date   APPENDECTOMY     COLONOSCOPY WITH ESOPHAGOGASTRODUODENOSCOPY (EGD)     COLOSTOMY     FLEXIBLE SIGMOIDOSCOPY N/A 01/12/2022   Procedure: FLEXIBLE SIGMOIDOSCOPY;  Surgeon: Teresa Lonni HERO, MD;  Location: [facility];  Service: General;  Laterality: N/A;   LAPAROTOMY N/A 06/27/2021   Procedure: EXPLORATORY LAPAROTOMY, SIGMOID COLECTOMY WITH COLOSTOMY;  Surgeon: Curvin Deward MOULD, MD;  Location: [facility];  Service: General;  Laterality: N/A;   LYSIS OF ADHESION N/A 01/12/2022   Procedure: LYSIS OF ADHESION;  Surgeon: Teresa Lonni HERO, MD;  Location: [facility];  Service: General;  Laterality: N/A;   RENAL ANGIOGRAPHY Right 02/05/2019   Procedure: RENAL ANGIOGRAPHY;  Surgeon: Gretta Lonni PARAS, MD;  Location: [facility];  Service: Cardiovascular;  Laterality: Right;   XI ROBOTIC ASSISTED COLOSTOMY TAKEDOWN N/A 01/12/2022   Procedure: XI ROBOTIC ASSISTED COLOSTOMY TAKEDOWN AND INTRAOPERATIVE ASSESSMENT OF PERFUSION ICG;  Surgeon: Teresa Lonni HERO, MD;  Location: [facility];  Service: General;  Laterality: N/A;    FAMILY HISTORY: Family History  Problem Relation Age of Onset   Stroke Mother    Dementia Mother     Lymphoma Brother             SOCIAL HISTORY: Social History   Socioeconomic History   Marital status: Married    Spouse name: Not on file   Number of children: 3   Years of education: Masters   Highest education level: Not on file  Occupational History   Not on file  Tobacco Use   Smoking status: Never   Smokeless tobacco: Never  Vaping Use   Vaping status: Never Used  Substance and Sexual Activity   Alcohol  use: Yes    Comment: 2 glasses of wine/month  Drug use: No   Sexual activity: Not Currently    Partners: Male    Birth control/protection: Post-menopausal    Comment: less than 5, IC after 16, no STD, no abnormal pap, no DES  Other Topics Concern   Not on file  Social History Narrative   Married lives with hsuband at home.  Retired.  Has Masters degree.  Has 3 children.    Right-handed   Caffeine:   Social Drivers of Health   Tobacco Use: Low Risk (06/17/2024)   Patient History    Smoking Tobacco Use: Never    Smokeless Tobacco Use: Never    Passive Exposure: Not on file  Financial Resource Strain: Not on file  Food Insecurity: Low Risk (06/11/2024)   Received from [facility]   Epic    Within the past 12 months, you worried that your food would run out before you got money to buy more: Never true    Within the past 12 months, the food you bought just didn't last and you didn't have money to get more. : Never true  Transportation Needs: No Transportation Needs (06/11/2024)   Received from [facility]    In the past 12 months, has lack of reliable transportation kept you from medical appointments, meetings, work or from getting things needed for daily living? : No  Physical Activity: Not on file  Stress: Stress Concern Present (06/09/2024)   Received from [facility] - Occupational Stress Questionnaire    Do you feel stress - tense, restless, nervous, or anxious, or unable to sleep at night  because your mind is troubled all the time - these days?: To some extent  Social Connections: Not on file  Intimate Partner Violence: Not At Risk (06/08/2024)   Received from [facility]   HITS    Over the last 12 months how often did your partner physically hurt you?: Never    Over the last 12 months how often did your partner insult you or talk down to you?: Never    Over the last 12 months how often did your partner threaten you with physical harm?: Never    Over the last 12 months how often did your partner scream or curse at you?: Never  Depression (PHQ2-9): Not on file  Alcohol  Screen: Not on file  Housing: Low Risk (06/11/2024)   Received from [facility]   Epic    What is your living situation today?: I have a steady place to live    Think about the place you live. Do you have problems with any of the following? Choose all that apply:: None/None on this list  Utilities: Low Risk (06/11/2024)   Received from [facility]   Utilities    In the past 12 months has the electric, gas, oil, or water  company threatened to shut off services in your home? : No  Health Literacy: Not on file   PHYSICAL EXAM  Vitals:   06/17/24 1442  BP: 105/68  Pulse: (!) 51  SpO2: 98%  Weight: 140 lb (63.5 kg)  Height: 5' 2 (1.575 m)    Body mass index is 25.61 kg/m.  Generalized: Well developed, in no acute distress     06/17/2024    2:40 PM 11/07/2023    8:49 AM 07/17/2022    9:53 AM  MMSE - Mini Mental State Exam  Orientation to time 5 5 5   Orientation to Place 5 5  5  Registration 3 3 3   Attention/ Calculation 5 5 5   Recall 3 3 3   Language- name 2 objects 2 2 2   Language- repeat 1 1 1   Language- follow 3 step command 3 3 3   Language- read & follow direction 1 1 1   Write a sentence 1 1 1   Copy design 1 1 1   Total score 30 30 30    Neurological examination  Mentation: Alert oriented to time, place, history taking. Follows all commands speech and language fluent Cranial nerve  II-XII: Pupils were equal round reactive to light. Extraocular movements were full, visual field were full on confrontational test. Facial sensation and strength were normal.  Head turning and shoulder shrug  were normal and symmetric. Motor: The motor testing reveals 5 over 5 strength of all 4 extremities. Good symmetric motor tone is noted throughout.  Sensory: Sensory testing is intact to soft touch on all 4 extremities. No evidence of extinction is noted.  Coordination: Cerebellar testing reveals good finger-nose-finger and heel-to-shin bilaterally.  Gait and station: Gait is normal Reflexes: Deep tendon reflexes are symmetric and normal bilaterally.   DIAGNOSTIC DATA (LABS, IMAGING, TESTING) - I reviewed patient records, labs, notes, testing and imaging myself where available.  Lab Results  Component Value Date   WBC 6.5 01/15/2022   HGB 10.9 (L) 01/15/2022   HCT 32.0 (L) 01/15/2022   MCV 93.8 01/15/2022   PLT 220 01/15/2022      Component Value Date/Time   NA 139 06/11/2024 1512   K 3.9 06/11/2024 1512   CL 98 06/11/2024 1512   CO2 24 06/11/2024 1512   GLUCOSE 83 06/11/2024 1512   GLUCOSE 96 01/15/2022 0428   BUN 24 06/11/2024 1512   CREATININE 1.18 (H) 06/11/2024 1512   CALCIUM  9.6 06/11/2024 1512   PROT 7.4 01/02/2022 1347   ALBUMIN 4.0 01/02/2022 1347   AST 18 01/02/2022 1347   ALT 13 01/02/2022 1347   ALKPHOS 91 01/02/2022 1347   BILITOT 0.6 01/02/2022 1347   GFRNONAA >60 01/15/2022 0428   No results found for: CHOL, HDL, LDLCALC, LDLDIRECT, TRIG, CHOLHDL Lab Results  Component Value Date   HGBA1C 5.5 12/07/2021   Lab Results  Component Value Date   VITAMINB12 533 08/16/2015   No results found for: TSH   Lauraine Born, AGNP-C, DNP 06/17/2024, 2:44 PM [facility] [address] [place], [place] [address] [phone] "

## 2024-06-16 NOTE — Telephone Encounter (Signed)
 Patient is having upper Left arm and leg pain after receiving a IV in her left arm 06/08/24. Please advise.

## 2024-06-16 NOTE — Telephone Encounter (Signed)
 Spoke with patient regarding her L arm soreness/pain after having IV inserted and leg twinging pain during admission with [facility] 06/08/24. No redness, swelling, warmth at either location. Pt states you wouldn't know if you just looked at it. Pt also c/o neck soreness and has been using heating pad and ointment since admitted to [facility]. Pt advised to reach out to PCP for follow-up and recommendations. Also advised to contact us  if she needs anything further. Verbalizes understanding of plan.

## 2024-06-17 ENCOUNTER — Encounter: Payer: Self-pay | Admitting: Neurology

## 2024-06-17 ENCOUNTER — Ambulatory Visit: Admitting: Neurology

## 2024-06-17 VITALS — BP 105/68 | HR 51 | Ht 62.0 in | Wt 140.0 lb

## 2024-06-17 DIAGNOSIS — R413 Other amnesia: Secondary | ICD-10-CM

## 2024-06-17 DIAGNOSIS — F419 Anxiety disorder, unspecified: Secondary | ICD-10-CM

## 2024-06-17 NOTE — Patient Instructions (Signed)
 Check MRI of the brain, home sleep study to rule out sleep apnea

## 2024-06-19 ENCOUNTER — Telehealth: Payer: Self-pay | Admitting: Neurology

## 2024-06-19 NOTE — Telephone Encounter (Signed)
MRI order sent to [place] [phone]

## 2024-06-24 ENCOUNTER — Encounter: Payer: Self-pay | Admitting: Neurology

## 2024-06-25 ENCOUNTER — Telehealth (HOSPITAL_COMMUNITY): Payer: Self-pay | Admitting: Emergency Medicine

## 2024-06-25 NOTE — Telephone Encounter (Signed)
 Reaching out to patient to offer assistance regarding upcoming cardiac imaging study; pt verbalizes understanding of appt date/time, parking situation and where to check in, pre-test NPO status and medications ordered, and verified current allergies; name and call back number provided for further questions should they arise Rockwell Alexandria RN Navigator Cardiac Imaging [facility] [phone] office [phone] cell

## 2024-06-26 ENCOUNTER — Ambulatory Visit (HOSPITAL_COMMUNITY)
Admission: RE | Admit: 2024-06-26 | Discharge: 2024-06-26 | Disposition: A | Discharge status: Home / Self Care | Source: Ambulatory Visit | Attending: Cardiology | Admitting: Cardiology

## 2024-06-26 DIAGNOSIS — I480 Paroxysmal atrial fibrillation: Secondary | ICD-10-CM | POA: Diagnosis not present

## 2024-06-26 DIAGNOSIS — R7989 Other specified abnormal findings of blood chemistry: Secondary | ICD-10-CM | POA: Insufficient documentation

## 2024-06-26 DIAGNOSIS — I700 Atherosclerosis of aorta: Secondary | ICD-10-CM | POA: Diagnosis not present

## 2024-06-26 DIAGNOSIS — I2510 Atherosclerotic heart disease of native coronary artery without angina pectoris: Secondary | ICD-10-CM | POA: Insufficient documentation

## 2024-06-26 MED ORDER — IOHEXOL 350 MG/ML SOLN
100.0000 mL | Freq: Once | INTRAVENOUS | Status: AC | PRN
  Administered 2024-06-26: 100 mL via INTRAVENOUS

## 2024-06-26 MED ORDER — NITROGLYCERIN 0.4 MG SL SUBL
0.8000 mg | SUBLINGUAL_TABLET | Freq: Once | SUBLINGUAL | Status: AC
  Administered 2024-06-26: 0.8 mg via SUBLINGUAL

## 2024-06-27 ENCOUNTER — Ambulatory Visit

## 2024-06-27 DIAGNOSIS — R413 Other amnesia: Secondary | ICD-10-CM

## 2024-06-27 DIAGNOSIS — F419 Anxiety disorder, unspecified: Secondary | ICD-10-CM

## 2024-06-27 LAB — BASIC METABOLIC PANEL WITH GFR
BUN/Creatinine Ratio: 20 (ref 12–28)
BUN: 19 mg/dL (ref 8–27)
CO2: 26 mmol/L (ref 20–29)
Calcium: 9.5 mg/dL (ref 8.7–10.3)
Chloride: 101 mmol/L (ref 96–106)
Creatinine, Ser: 0.95 mg/dL (ref 0.57–1.00)
Glucose: 91 mg/dL (ref 70–99)
Potassium: 3.9 mmol/L (ref 3.5–5.2)
Sodium: 142 mmol/L (ref 134–144)
eGFR: 63 mL/min/{1.73_m2}

## 2024-07-02 NOTE — Progress Notes (Unsigned)
 Carla Ramirez

## 2024-07-07 ENCOUNTER — Other Ambulatory Visit

## 2024-12-15 ENCOUNTER — Ambulatory Visit: Admitting: Neurology

## 2024-12-18 ENCOUNTER — Ambulatory Visit: Admitting: Neurology
